# Patient Record
Sex: Male | Born: 1960 | Race: White | Hispanic: No | Marital: Single | Smoking: Current every day smoker
Health system: Southern US, Community
[De-identification: ages and names within clinical notes are randomized; demographics above are authoritative.]

## PROBLEM LIST (undated history)

## (undated) DIAGNOSIS — N289 Disorder of kidney and ureter, unspecified: Secondary | ICD-10-CM

## (undated) DIAGNOSIS — I10 Essential (primary) hypertension: Secondary | ICD-10-CM

---

## 1997-05-14 ENCOUNTER — Emergency Department: Admission: EM | Admit: 1997-05-14 | Discharge: 1997-05-14 | Payer: Self-pay | Admitting: Emergency Medicine

## 1997-08-12 ENCOUNTER — Emergency Department (HOSPITAL_COMMUNITY): Admission: EM | Admit: 1997-08-12 | Discharge: 1997-08-12 | Payer: Self-pay | Admitting: Emergency Medicine

## 2004-11-30 ENCOUNTER — Other Ambulatory Visit: Payer: Self-pay

## 2004-11-30 ENCOUNTER — Emergency Department: Payer: Self-pay | Admitting: Emergency Medicine

## 2004-11-30 IMAGING — CT CT HEAD WITHOUT CONTRAST
2 series · 16 of 30 positions shown, 20 images · non-contrast
Comparison: none

REASON FOR EXAM: syncopal episode, ? seizure. Rm 17
COMMENTS:

[Series 2: without · axial · non-contrast · 0.39mm/px · z∈[-142,-22]mm · 13 of 29 slices shown, 17 images]
[im 3/29  brain]
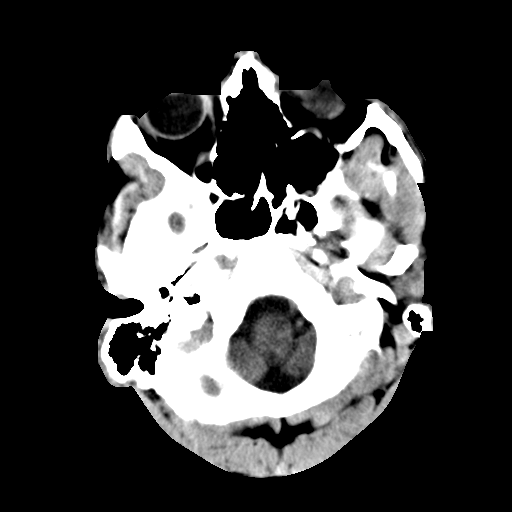
[im 3/29  bone]
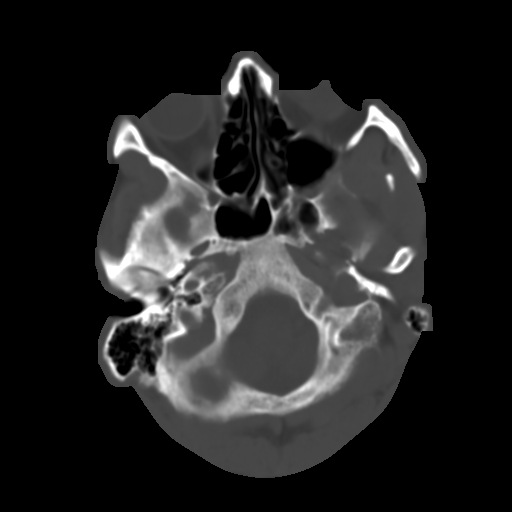
[im 5/29  brain]
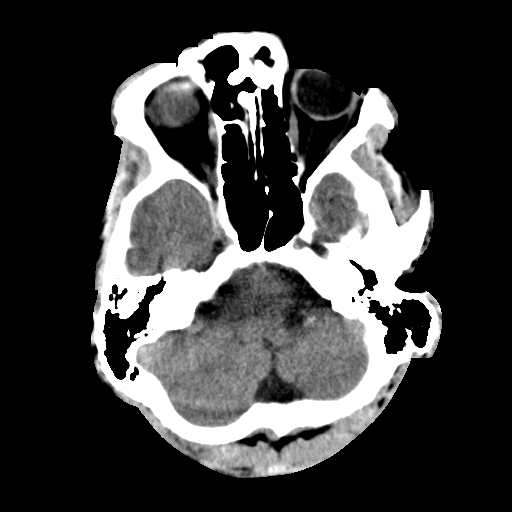
[im 7/29  brain]
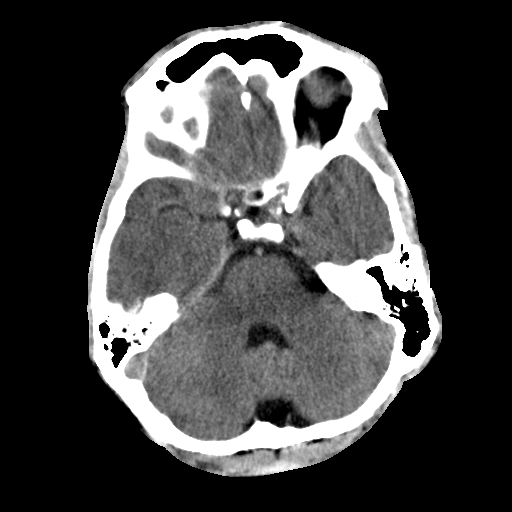
[im 9/29  brain]
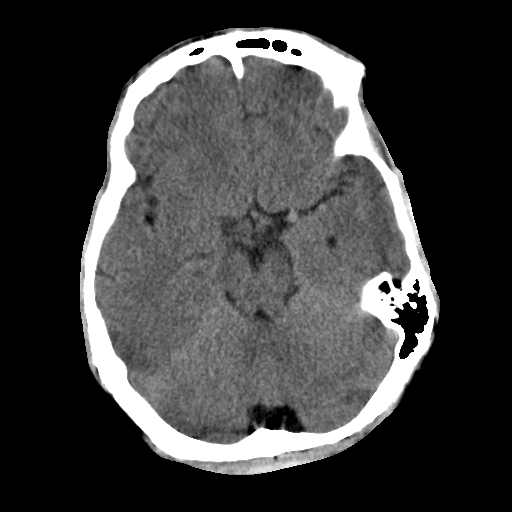
[im 11/29  brain]
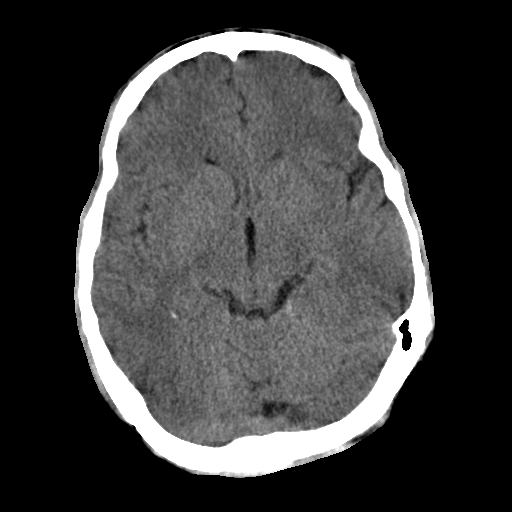
[im 11/29  bone]
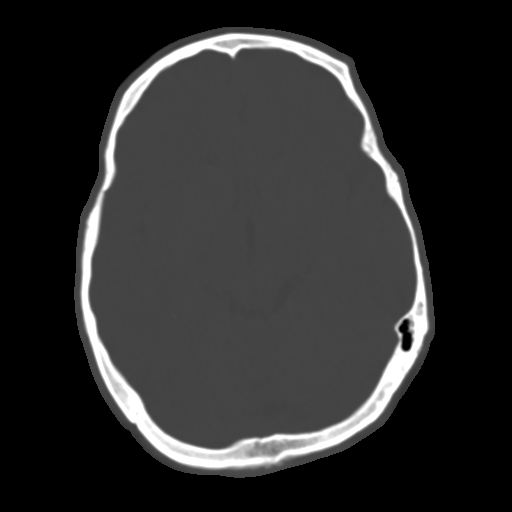
[im 13/29  brain]
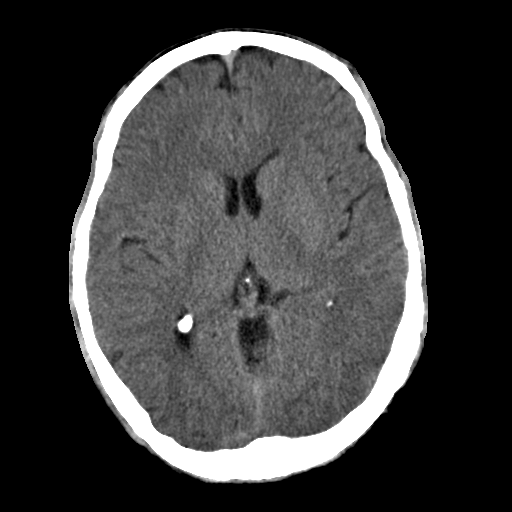
[im 15/29  brain]
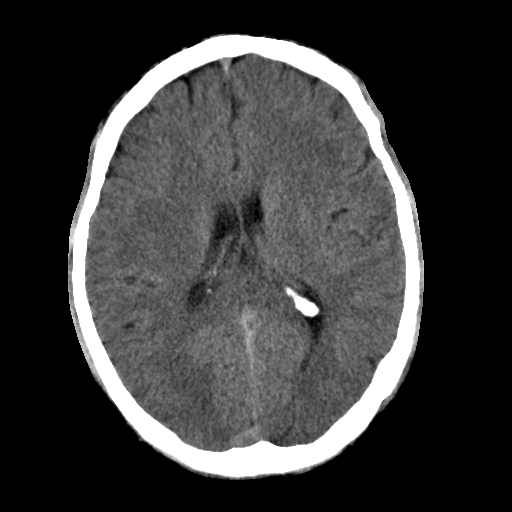
[im 17/29  brain]
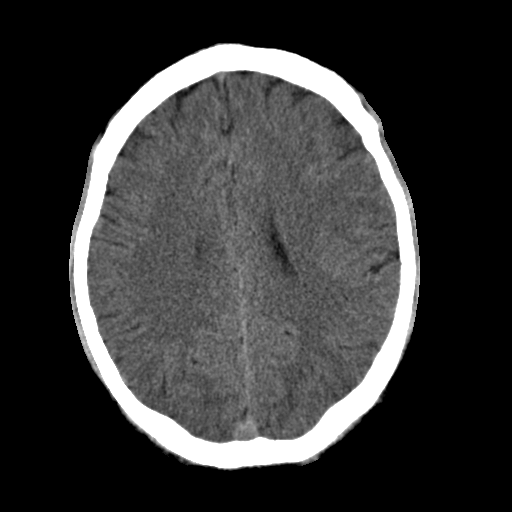
[im 19/29  brain]
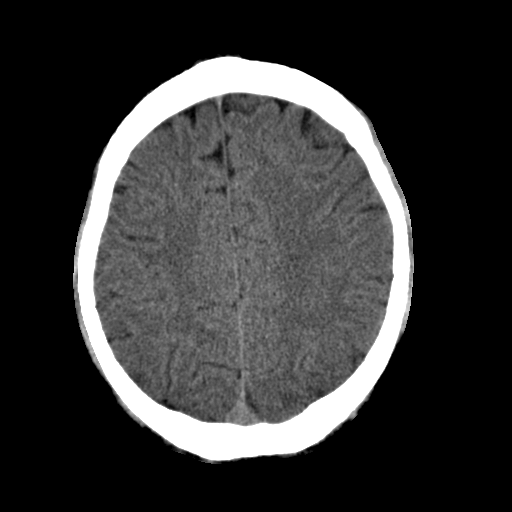
[im 19/29  bone]
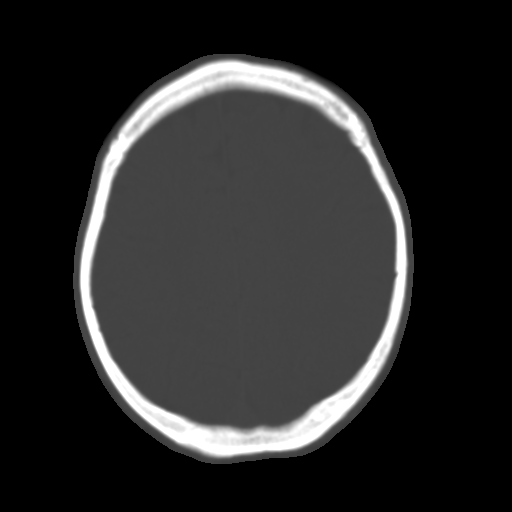
[im 21/29  brain]
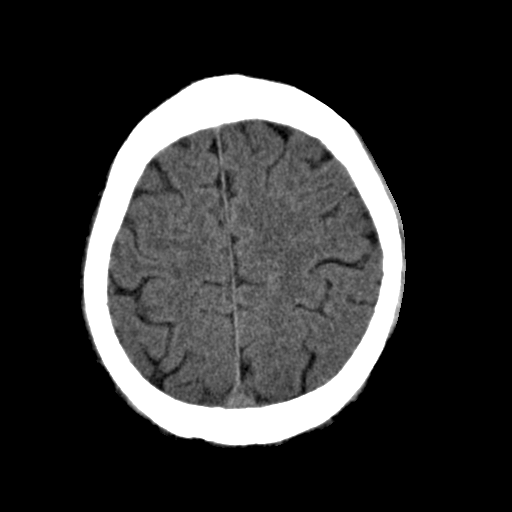
[im 23/29  brain]
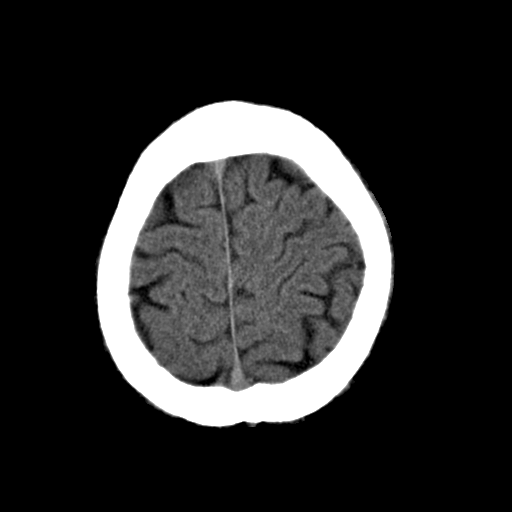
[im 25/29  brain]
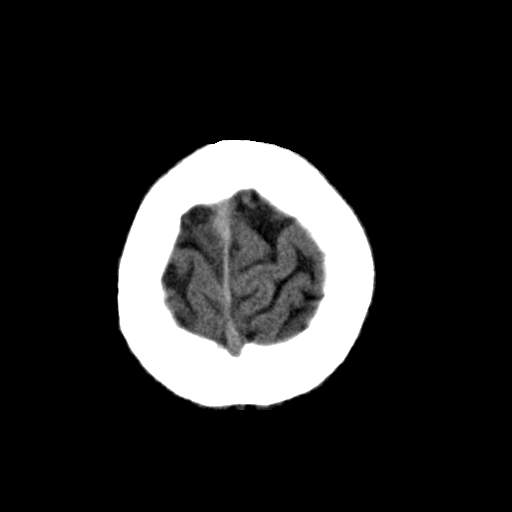
[im 27/29  brain]
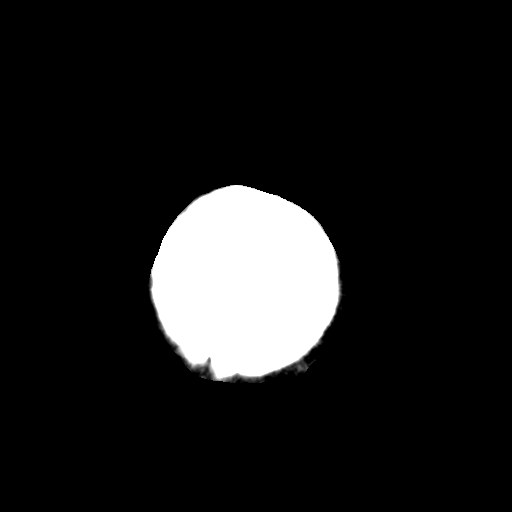
[im 27/29  bone]
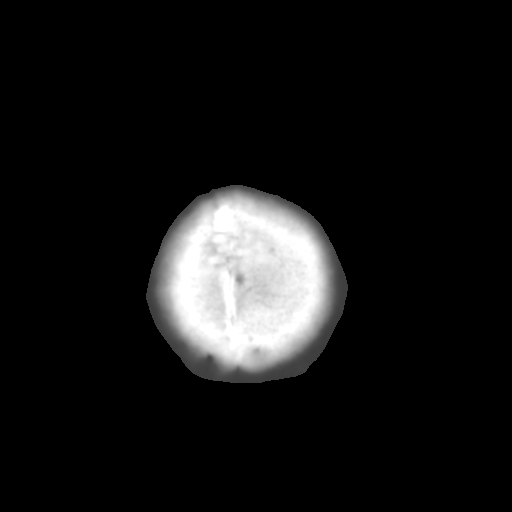

[Series 3: bone · axial · 0.39mm/px · z∈[-142,-102]mm · 3 of 29 slices shown]
[im 3/29  bone]
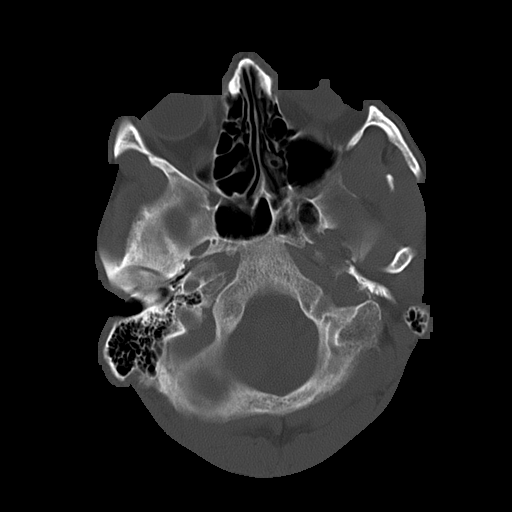
[im 7/29  bone]
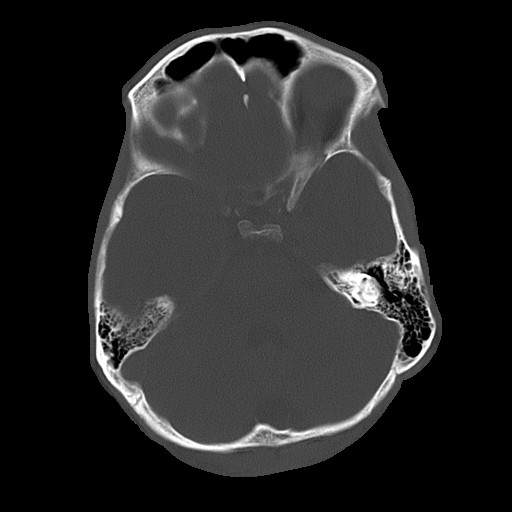
[im 11/29  bone]
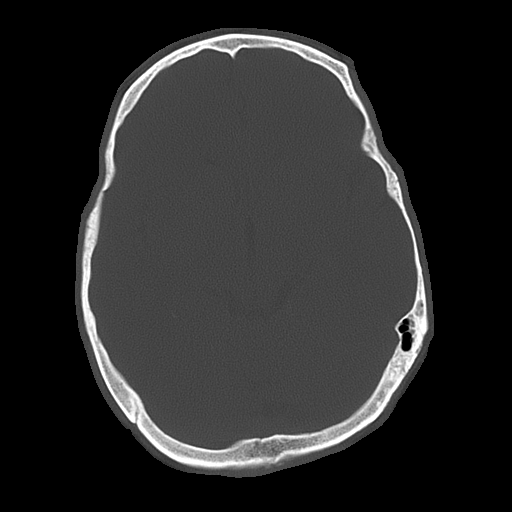

[16 of 30 positions shown; findings below may reference images not displayed]

PROCEDURE:     CT  - CT HEAD WITHOUT CONTRAST  - [DATE] [DATE]

RESULT:     Unenhanced head CT was performed.  No intracerebral bleeds.  No
infarcts.  No mass effect.  No shift of the midline.  The ventricles appear
within normal limits in size.  No extraaxial fluid collections are
identified.

On the bone windows, the globe appears intact.
IMPRESSION: No acute abnormalities on the unenhanced emergent head CT.  This report was
called to the emergency room at the conclusion of the study.

## 2004-11-30 IMAGING — CR DG CHEST 1V PORT
1 series · 1 of 1 positions shown · non-contrast
Comparison: none

REASON FOR EXAM: Chest pain
COMMENTS:

[view not recorded]
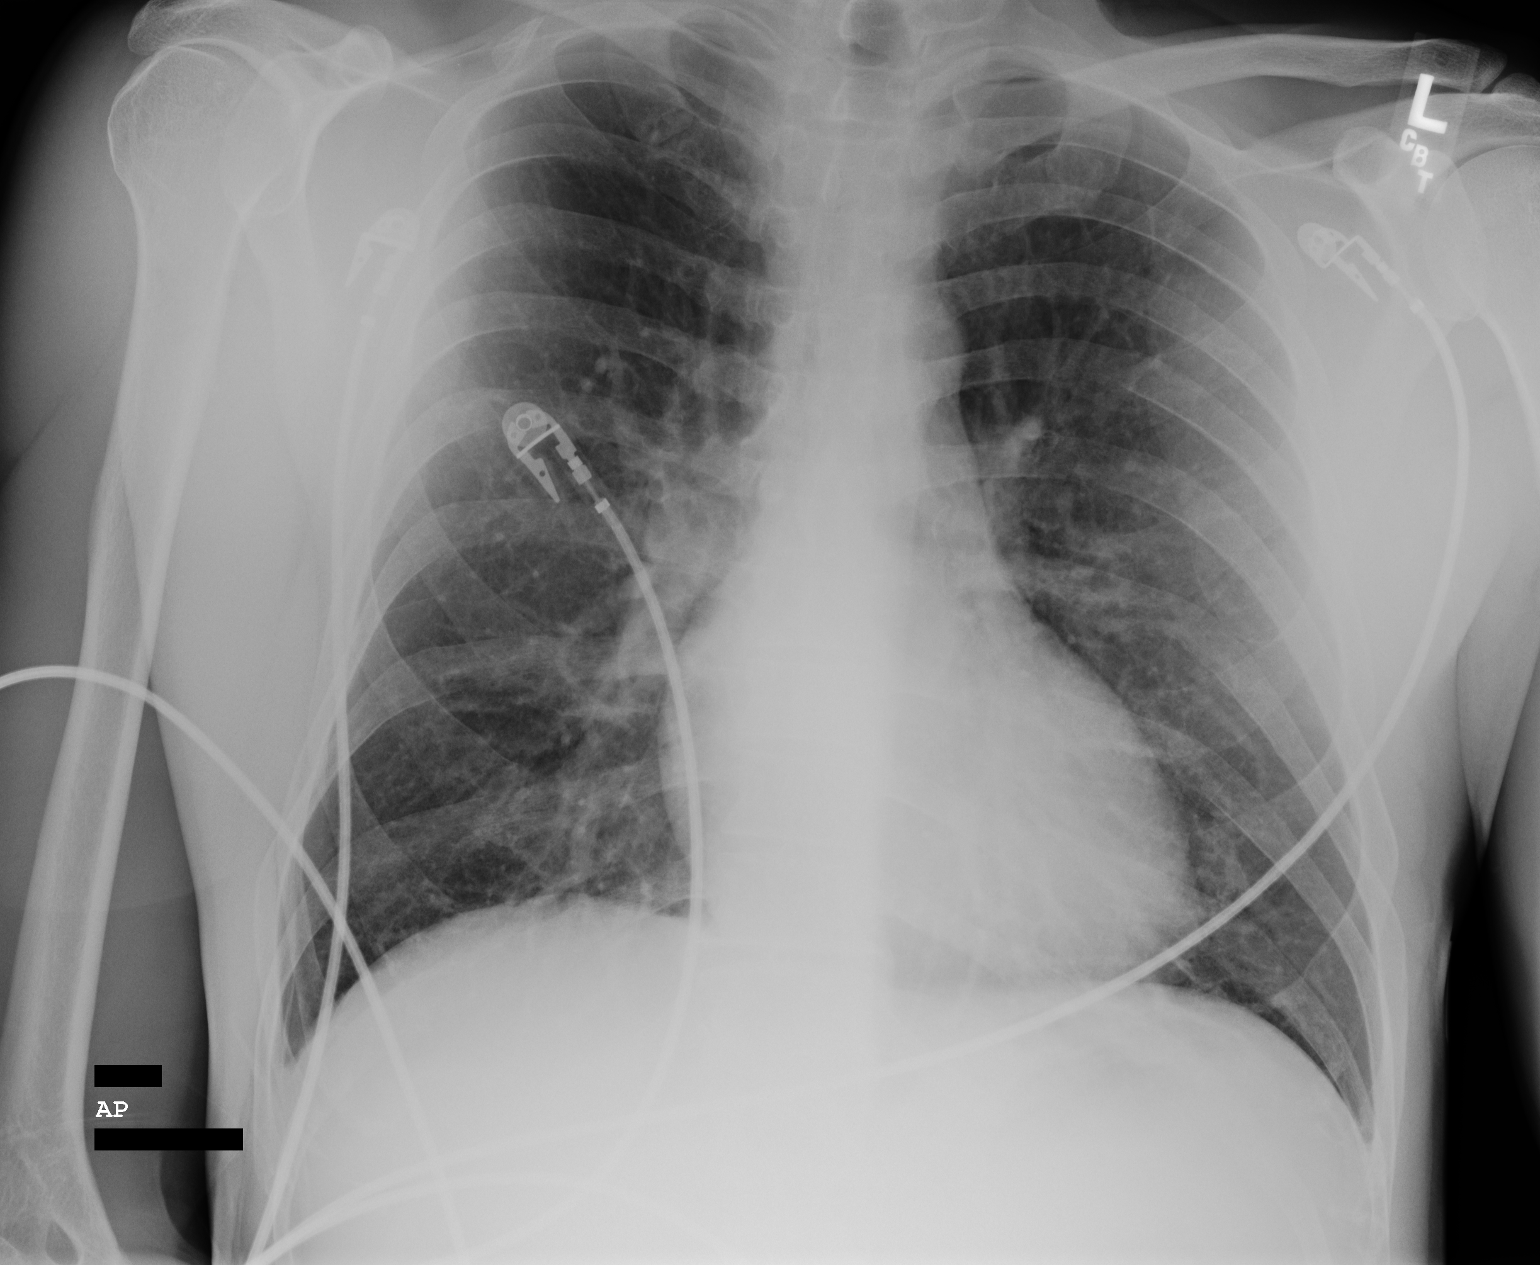

[1 of 1 positions shown; findings below may reference images not displayed]

PROCEDURE:     DXR - DXR PORTABLE CHEST SINGLE VIEW  - [DATE] [DATE]

RESULT:     There are no prior studies for comparison.

The lungs are adequately inflated.  There is no focal infiltrate.  The heart
is not enlarged.  The pulmonary vascularity is very mildly prominent
centrally but I do not see cephalization of flow and no pleural effusion.
IMPRESSION: I do not see objective evidence of congestive heart
failure nor of pneumonia. I cannot exclude subsegmental atelectasis in the
perihilar regions.

## 2007-08-07 ENCOUNTER — Emergency Department (HOSPITAL_COMMUNITY): Admission: EM | Admit: 2007-08-07 | Discharge: 2007-08-07 | Payer: Self-pay | Admitting: Emergency Medicine

## 2007-08-08 ENCOUNTER — Emergency Department (HOSPITAL_COMMUNITY): Admission: EM | Admit: 2007-08-08 | Discharge: 2007-08-08 | Payer: Self-pay | Admitting: Emergency Medicine

## 2007-09-14 ENCOUNTER — Emergency Department (HOSPITAL_COMMUNITY): Admission: EM | Admit: 2007-09-14 | Discharge: 2007-09-14 | Payer: Self-pay | Admitting: Emergency Medicine

## 2007-09-16 ENCOUNTER — Emergency Department (HOSPITAL_COMMUNITY): Admission: EM | Admit: 2007-09-16 | Discharge: 2007-09-16 | Payer: Self-pay | Admitting: Family Medicine

## 2010-09-30 LAB — POCT I-STAT, CHEM 8
BUN: 16
Chloride: 103
Creatinine, Ser: 1.4
Sodium: 136
TCO2: 26

## 2010-09-30 LAB — CBC
HCT: 49
Hemoglobin: 16.7
Platelets: 194
WBC: 12.8 — ABNORMAL HIGH

## 2010-09-30 LAB — DIFFERENTIAL
Eosinophils Relative: 1
Lymphocytes Relative: 14
Lymphs Abs: 1.7
Neutro Abs: 10 — ABNORMAL HIGH

## 2019-03-10 ENCOUNTER — Observation Stay (HOSPITAL_COMMUNITY): Payer: Medicaid Other

## 2019-03-10 ENCOUNTER — Inpatient Hospital Stay (HOSPITAL_COMMUNITY)
Admission: EM | Admit: 2019-03-10 | Discharge: 2019-04-04 | DRG: 674 | Disposition: A | Discharge status: Home / Self Care | Payer: Medicaid Other | Attending: Internal Medicine | Admitting: Internal Medicine

## 2019-03-10 ENCOUNTER — Emergency Department (HOSPITAL_COMMUNITY): Payer: Medicaid Other

## 2019-03-10 ENCOUNTER — Other Ambulatory Visit: Payer: Self-pay

## 2019-03-10 ENCOUNTER — Encounter (HOSPITAL_COMMUNITY): Payer: Self-pay | Admitting: Emergency Medicine

## 2019-03-10 DIAGNOSIS — N179 Acute kidney failure, unspecified: Secondary | ICD-10-CM | POA: Diagnosis present

## 2019-03-10 DIAGNOSIS — E872 Acidosis: Secondary | ICD-10-CM | POA: Diagnosis present

## 2019-03-10 DIAGNOSIS — R001 Bradycardia, unspecified: Secondary | ICD-10-CM | POA: Diagnosis present

## 2019-03-10 DIAGNOSIS — E8809 Other disorders of plasma-protein metabolism, not elsewhere classified: Secondary | ICD-10-CM | POA: Diagnosis present

## 2019-03-10 DIAGNOSIS — D631 Anemia in chronic kidney disease: Secondary | ICD-10-CM | POA: Diagnosis present

## 2019-03-10 DIAGNOSIS — Z20822 Contact with and (suspected) exposure to covid-19: Secondary | ICD-10-CM | POA: Diagnosis present

## 2019-03-10 DIAGNOSIS — H532 Diplopia: Secondary | ICD-10-CM | POA: Diagnosis present

## 2019-03-10 DIAGNOSIS — E860 Dehydration: Secondary | ICD-10-CM

## 2019-03-10 DIAGNOSIS — J8410 Pulmonary fibrosis, unspecified: Secondary | ICD-10-CM | POA: Diagnosis present

## 2019-03-10 DIAGNOSIS — Z590 Homelessness: Secondary | ICD-10-CM

## 2019-03-10 DIAGNOSIS — I776 Arteritis, unspecified: Secondary | ICD-10-CM | POA: Diagnosis present

## 2019-03-10 DIAGNOSIS — Z833 Family history of diabetes mellitus: Secondary | ICD-10-CM

## 2019-03-10 DIAGNOSIS — I7789 Other specified disorders of arteries and arterioles: Secondary | ICD-10-CM | POA: Diagnosis present

## 2019-03-10 DIAGNOSIS — Z720 Tobacco use: Secondary | ICD-10-CM | POA: Diagnosis present

## 2019-03-10 DIAGNOSIS — D72829 Elevated white blood cell count, unspecified: Secondary | ICD-10-CM | POA: Diagnosis not present

## 2019-03-10 DIAGNOSIS — Z8249 Family history of ischemic heart disease and other diseases of the circulatory system: Secondary | ICD-10-CM

## 2019-03-10 DIAGNOSIS — F17210 Nicotine dependence, cigarettes, uncomplicated: Secondary | ICD-10-CM | POA: Diagnosis present

## 2019-03-10 DIAGNOSIS — R42 Dizziness and giddiness: Secondary | ICD-10-CM

## 2019-03-10 DIAGNOSIS — M317 Microscopic polyangiitis: Secondary | ICD-10-CM | POA: Diagnosis present

## 2019-03-10 DIAGNOSIS — N2581 Secondary hyperparathyroidism of renal origin: Secondary | ICD-10-CM | POA: Diagnosis present

## 2019-03-10 DIAGNOSIS — N19 Unspecified kidney failure: Secondary | ICD-10-CM

## 2019-03-10 DIAGNOSIS — J449 Chronic obstructive pulmonary disease, unspecified: Secondary | ICD-10-CM | POA: Diagnosis present

## 2019-03-10 DIAGNOSIS — N189 Chronic kidney disease, unspecified: Secondary | ICD-10-CM | POA: Diagnosis present

## 2019-03-10 DIAGNOSIS — I129 Hypertensive chronic kidney disease with stage 1 through stage 4 chronic kidney disease, or unspecified chronic kidney disease: Secondary | ICD-10-CM | POA: Diagnosis present

## 2019-03-10 DIAGNOSIS — D649 Anemia, unspecified: Secondary | ICD-10-CM | POA: Diagnosis present

## 2019-03-10 DIAGNOSIS — E875 Hyperkalemia: Secondary | ICD-10-CM | POA: Diagnosis present

## 2019-03-10 LAB — HEPATITIS PANEL, ACUTE
HCV Ab: NONREACTIVE
Hep A IgM: NONREACTIVE
Hep B C IgM: NONREACTIVE
Hepatitis B Surface Ag: NONREACTIVE

## 2019-03-10 LAB — COMPREHENSIVE METABOLIC PANEL
ALT: 16 U/L (ref 0–44)
AST: 10 U/L — ABNORMAL LOW (ref 15–41)
Albumin: 3.4 g/dL — ABNORMAL LOW (ref 3.5–5.0)
Alkaline Phosphatase: 78 U/L (ref 38–126)
Anion gap: 12 (ref 5–15)
BUN: 88 mg/dL — ABNORMAL HIGH (ref 6–20)
CO2: 17 mmol/L — ABNORMAL LOW (ref 22–32)
Calcium: 9.2 mg/dL (ref 8.9–10.3)
Chloride: 109 mmol/L (ref 98–111)
Creatinine, Ser: 10.96 mg/dL — ABNORMAL HIGH (ref 0.61–1.24)
GFR calc Af Amer: 5 mL/min — ABNORMAL LOW (ref >60)
GFR calc non Af Amer: 5 mL/min — ABNORMAL LOW (ref >60)
Glucose, Bld: 108 mg/dL — ABNORMAL HIGH (ref 70–99)
Potassium: 5.2 mmol/L — ABNORMAL HIGH (ref 3.5–5.1)
Sodium: 138 mmol/L (ref 135–145)
Total Bilirubin: 0.6 mg/dL (ref 0.3–1.2)
Total Protein: 7.5 g/dL (ref 6.5–8.1)

## 2019-03-10 LAB — GLUCOSE, CAPILLARY
Glucose-Capillary: 152 mg/dL — ABNORMAL HIGH (ref 70–99)
Glucose-Capillary: 197 mg/dL — ABNORMAL HIGH (ref 70–99)

## 2019-03-10 LAB — BASIC METABOLIC PANEL
Anion gap: 7 (ref 5–15)
Anion gap: 9 (ref 5–15)
BUN: 85 mg/dL — ABNORMAL HIGH (ref 6–20)
BUN: 85 mg/dL — ABNORMAL HIGH (ref 6–20)
CO2: 19 mmol/L — ABNORMAL LOW (ref 22–32)
CO2: 16 mmol/L — ABNORMAL LOW (ref 22–32)
Calcium: 8.2 mg/dL — ABNORMAL LOW (ref 8.9–10.3)
Calcium: 7.8 mg/dL — ABNORMAL LOW (ref 8.9–10.3)
Chloride: 112 mmol/L — ABNORMAL HIGH (ref 98–111)
Chloride: 112 mmol/L — ABNORMAL HIGH (ref 98–111)
Creatinine, Ser: 10.58 mg/dL — ABNORMAL HIGH (ref 0.61–1.24)
Creatinine, Ser: 10.6 mg/dL — ABNORMAL HIGH (ref 0.61–1.24)
GFR calc Af Amer: 6 mL/min — ABNORMAL LOW (ref >60)
GFR calc Af Amer: 6 mL/min — ABNORMAL LOW (ref >60)
GFR calc non Af Amer: 5 mL/min — ABNORMAL LOW (ref >60)
GFR calc non Af Amer: 5 mL/min — ABNORMAL LOW (ref >60)
Glucose, Bld: 143 mg/dL — ABNORMAL HIGH (ref 70–99)
Glucose, Bld: 246 mg/dL — ABNORMAL HIGH (ref 70–99)
Potassium: 6.2 mmol/L — ABNORMAL HIGH (ref 3.5–5.1)
Potassium: 6.1 mmol/L — ABNORMAL HIGH (ref 3.5–5.1)
Sodium: 138 mmol/L (ref 135–145)
Sodium: 137 mmol/L (ref 135–145)

## 2019-03-10 LAB — URINALYSIS, ROUTINE W REFLEX MICROSCOPIC
Bilirubin Urine: NEGATIVE
Glucose, UA: NEGATIVE mg/dL
Ketones, ur: NEGATIVE mg/dL
Leukocytes,Ua: NEGATIVE
Nitrite: NEGATIVE
Protein, ur: 100 mg/dL — AB
RBC / HPF: >50 RBC/hpf — ABNORMAL HIGH (ref 0–5)
Specific Gravity, Urine: 1.01 (ref 1.005–1.030)
pH: 5 (ref 5.0–8.0)

## 2019-03-10 LAB — URIC ACID: Uric Acid, Serum: 5.9 mg/dL (ref 3.7–8.6)

## 2019-03-10 LAB — CBC
HCT: 36 % — ABNORMAL LOW (ref 39.0–52.0)
Hemoglobin: 11.6 g/dL — ABNORMAL LOW (ref 13.0–17.0)
MCH: 28.9 pg (ref 26.0–34.0)
MCHC: 32.2 g/dL (ref 30.0–36.0)
MCV: 89.8 fL (ref 80.0–100.0)
Platelets: 244 10*3/uL (ref 150–400)
RBC: 4.01 MIL/uL — ABNORMAL LOW (ref 4.22–5.81)
RDW: 12.6 % (ref 11.5–15.5)
WBC: 9.6 10*3/uL (ref 4.0–10.5)
nRBC: 0 % (ref 0.0–0.2)

## 2019-03-10 LAB — SODIUM, URINE, RANDOM: Sodium, Ur: 39 mmol/L

## 2019-03-10 LAB — CK: Total CK: 45 U/L — ABNORMAL LOW (ref 49–397)

## 2019-03-10 LAB — HIV ANTIBODY (ROUTINE TESTING W REFLEX): HIV Screen 4th Generation wRfx: NONREACTIVE

## 2019-03-10 LAB — CREATININE, URINE, RANDOM: Creatinine, Urine: 91.02 mg/dL

## 2019-03-10 LAB — SARS CORONAVIRUS 2 (TAT 6-24 HRS): SARS Coronavirus 2: NEGATIVE

## 2019-03-10 LAB — TSH: TSH: 3.147 u[IU]/mL (ref 0.350–4.500)

## 2019-03-10 LAB — CBG MONITORING, ED: Glucose-Capillary: 100 mg/dL — ABNORMAL HIGH (ref 70–99)

## 2019-03-10 IMAGING — DX DG ANKLE COMPLETE 3+V*R*
3 series · 3 of 3 positions shown · non-contrast
Comparison: None.

CLINICAL DATA: Progressive pain

EXAM:
RIGHT ANKLE - COMPLETE 3+ VIEW

[ankle ap]
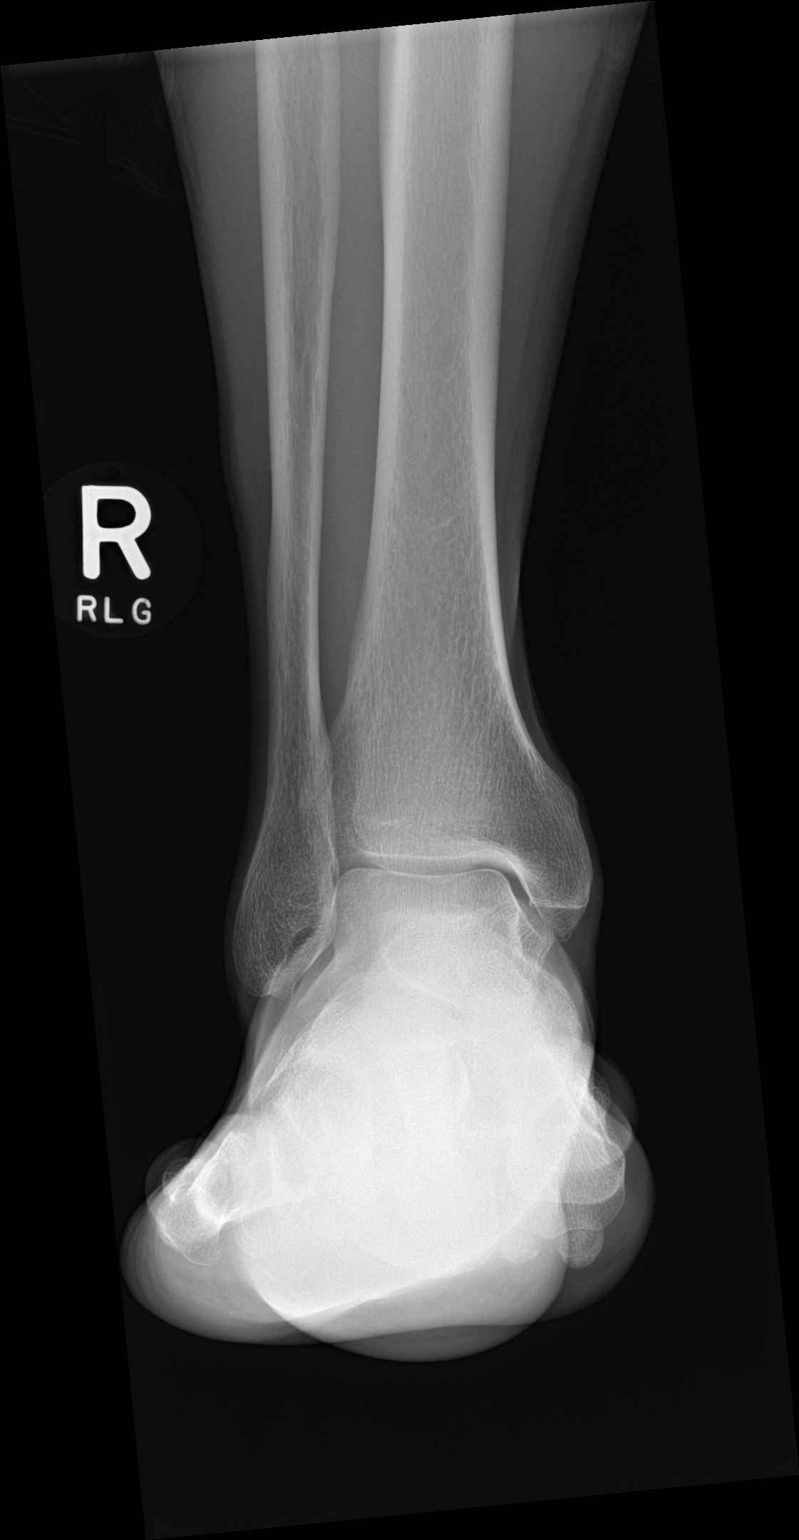

[ankle obl]
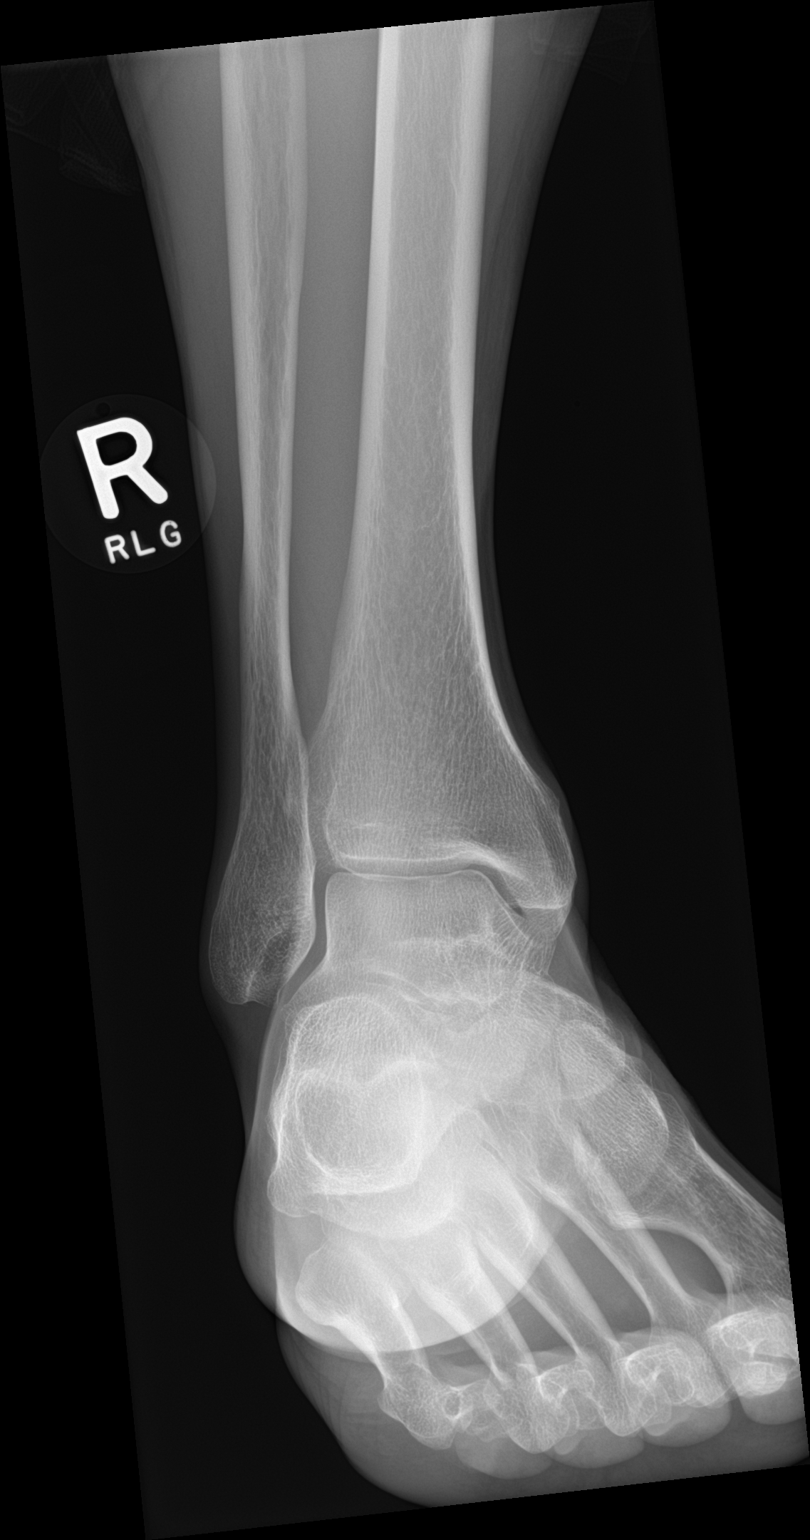

[ankle lat]
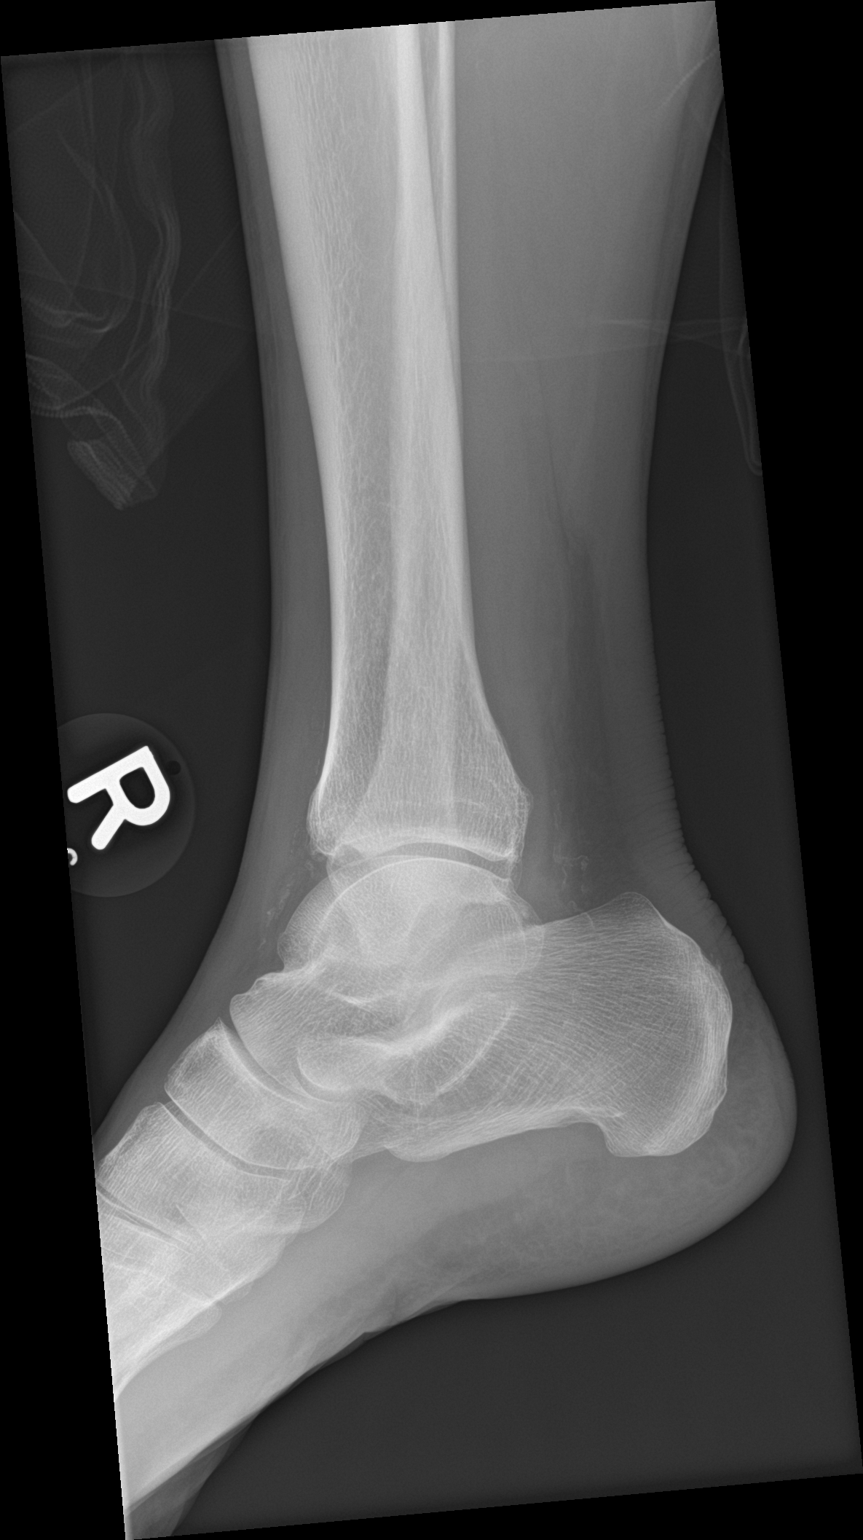

[3 of 3 positions shown; findings below may reference images not displayed]

FINDINGS: Frontal, oblique, and lateral views were obtained. No evident
fracture or joint effusion. No appreciable joint space narrowing or
erosion. Ankle mortise appears intact. There are multiple foci of
arterial vascular calcification.
IMPRESSION: No evident fracture or appreciable arthropathy. Ankle mortise
appears intact. There are foci of arterial vascular calcification

## 2019-03-10 IMAGING — US US RENAL
1 series · 14 of 25 positions shown · non-contrast
Comparison: None.

CLINICAL DATA: New onset renal failure.

EXAM:
RENAL / URINARY TRACT ULTRASOUND COMPLETE

[Series 1: us renal · 14 of 46 slices shown]
[im 1/46]
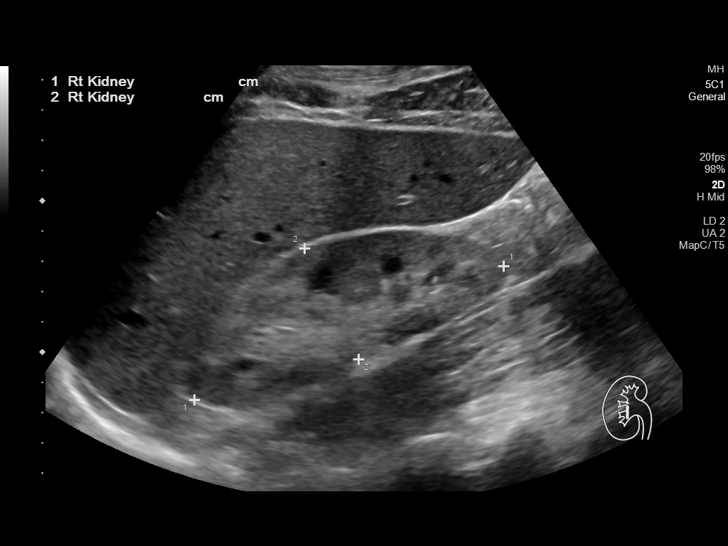
[im 4/46]
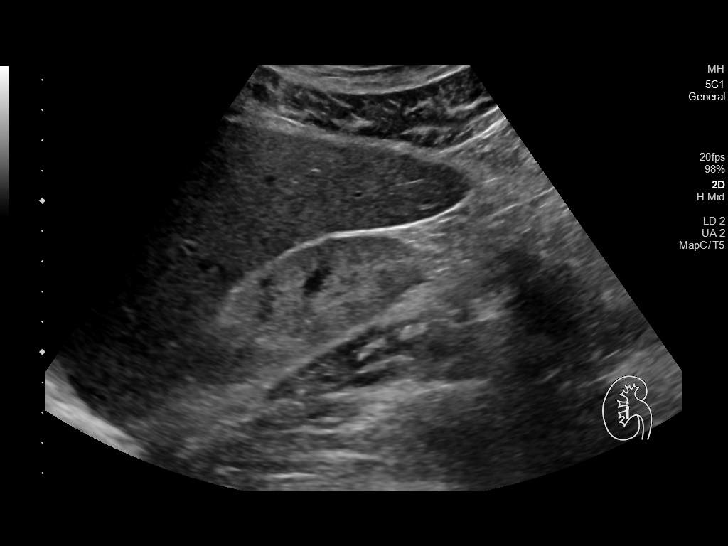
[im 8/46]
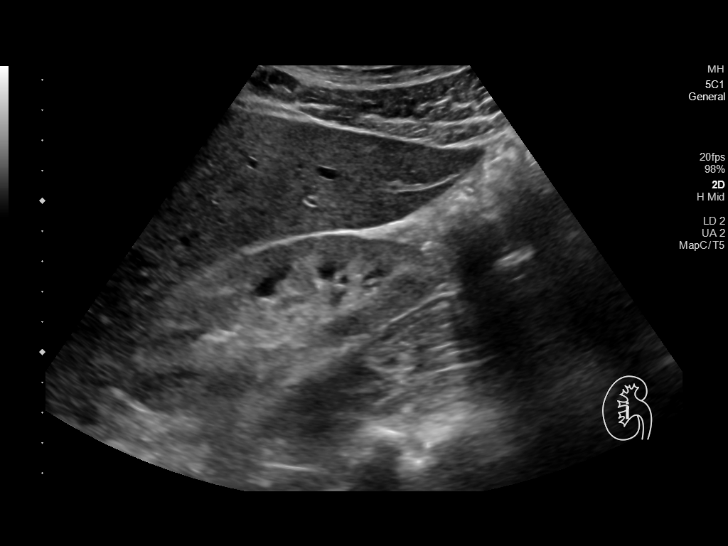
[im 12/46]
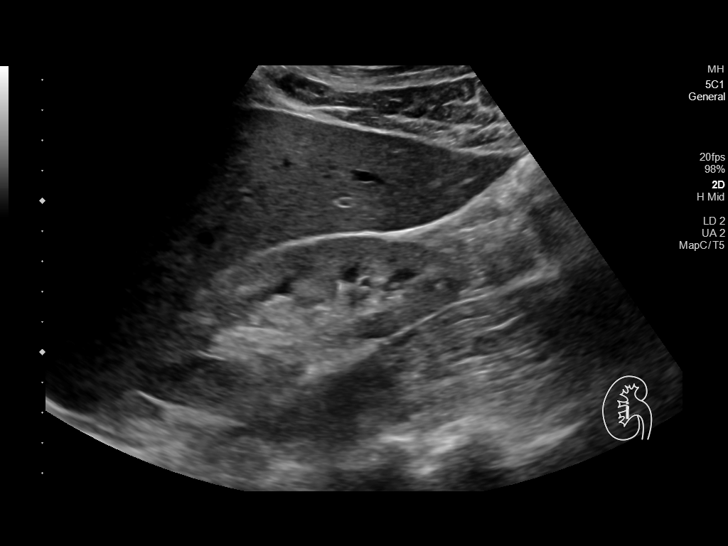
[im 16/46]
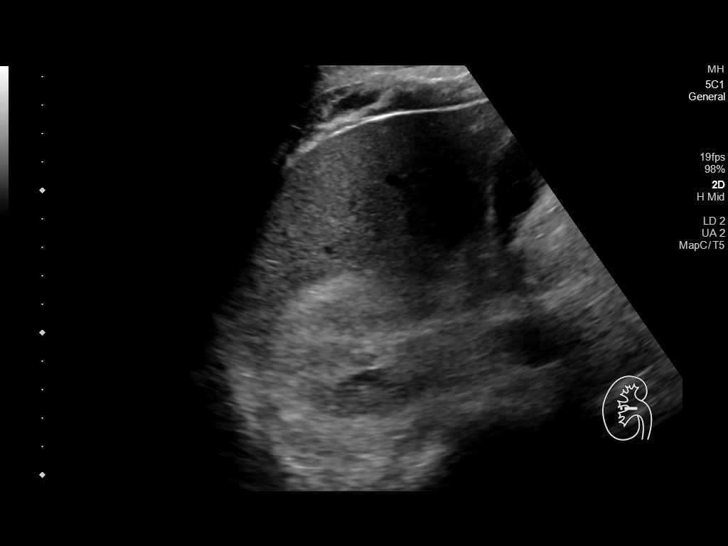
[im 17/46]
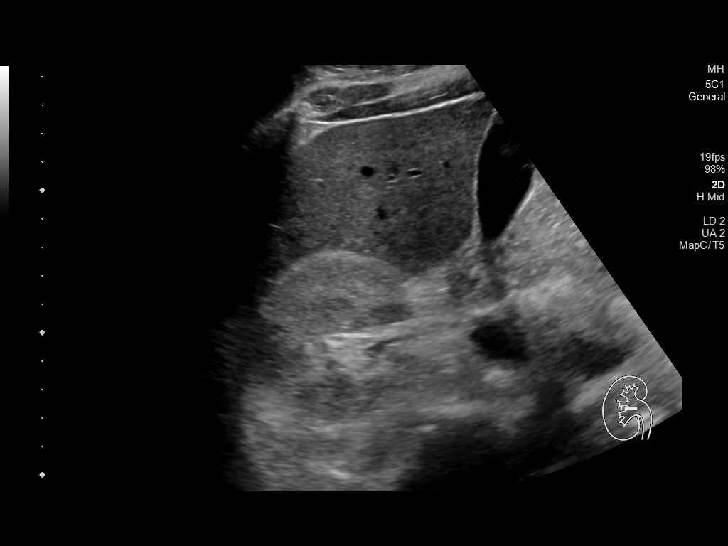
[im 21/46]
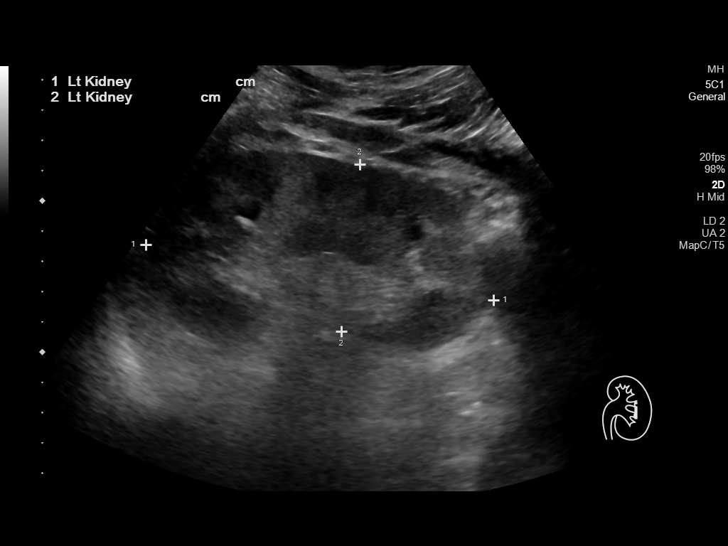
[im 25/46]
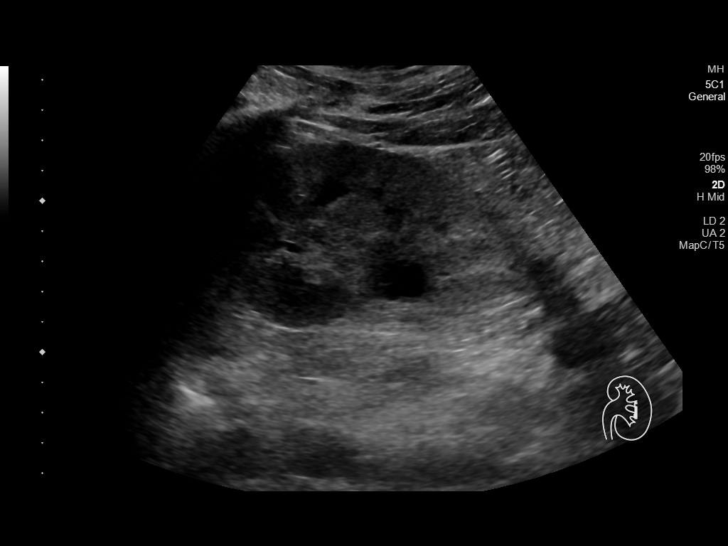
[im 29/46]
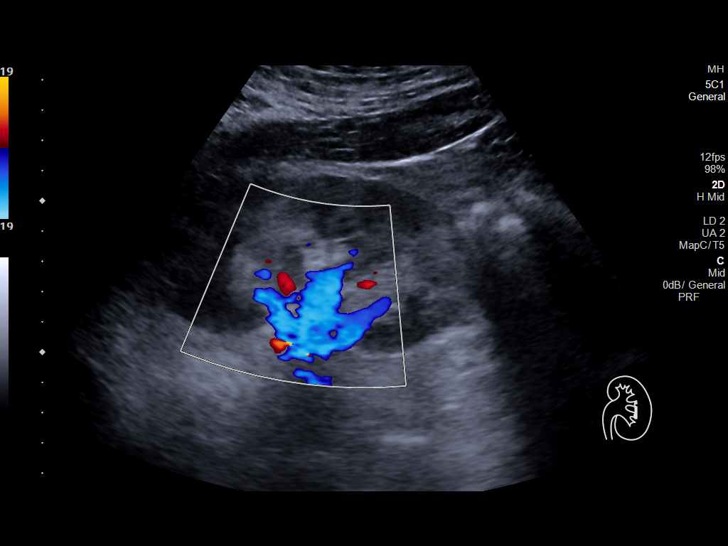
[im 31/46]
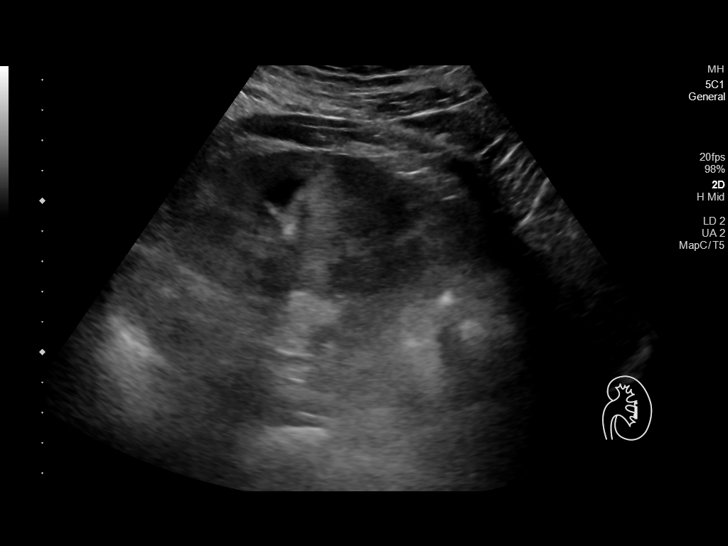
[im 34/46]
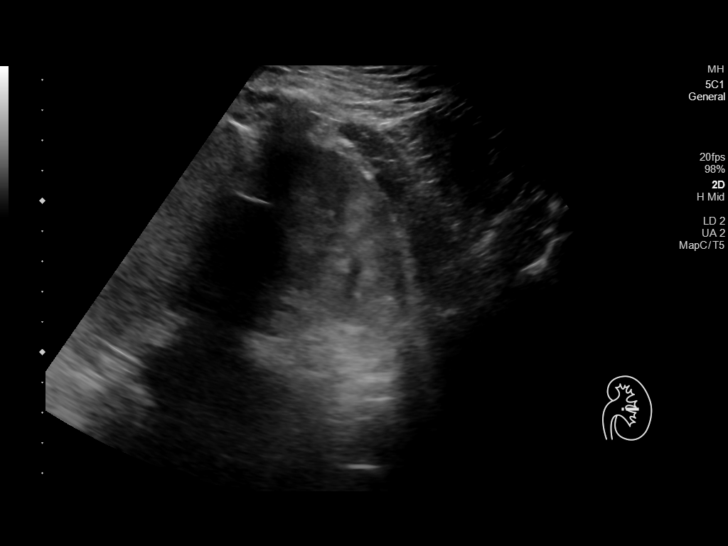
[im 38/46]
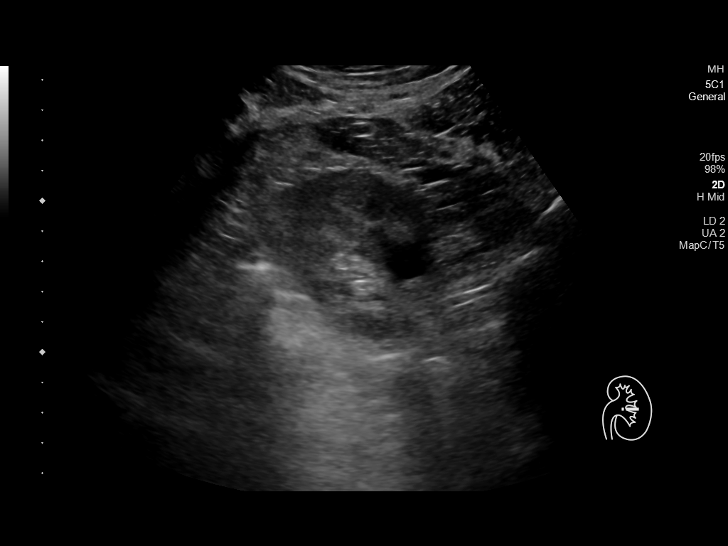
[im 42/46]
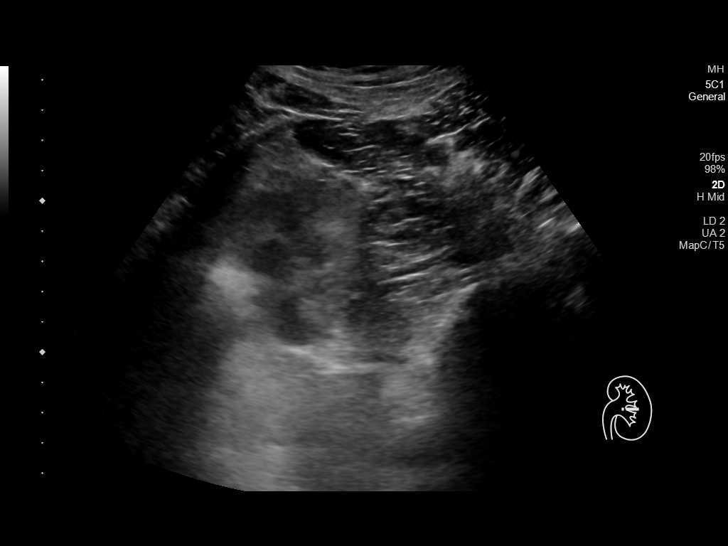
[im 46/46]
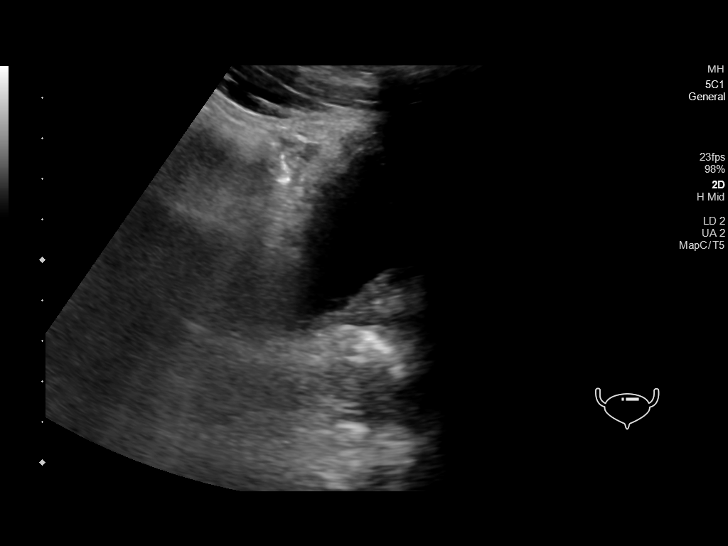

[14 of 25 positions shown; findings below may reference images not displayed]

FINDINGS: Right Kidney:

Renal measurements: 11.1 x 4.1 x 5.0 cm = volume: 120 mL. Increased
parenchymal echogenicity. No mass, stone or hydronephrosis.

Left Kidney:

Renal measurements: 11.6 x 5.6 x 5.1 cm = volume: 172 mL. Increased
parenchymal echogenicity. Medial midpole cyst measuring 1.7 x 1.3 x
1.4 cm. No other masses, no stones and no hydronephrosis.

Bladder:

Minimally distended.  Otherwise unremarkable.

Other:

None.
IMPRESSION: 1. Bilateral increased renal parenchymal echogenicity consistent
with medical renal disease.
2. No hydronephrosis.  No acute finding.
3. 1.7 cm left renal cyst.

## 2019-03-10 IMAGING — MR MR HEAD W/O CM
11 series · 48 of 48 positions shown · non-contrast
Comparison: CT head [DATE]

CLINICAL DATA: Subacute neuro deficit.  Diarrhea and vomiting

EXAM:
MRI HEAD WITHOUT CONTRAST
TECHNIQUE: Multiplanar, multiecho pulse sequences of the brain and surrounding
structures were obtained without intravenous contrast.

[Series 5: T1 · sagittal · 5.0mm · 0.75mm/px · 1 of 24 slices shown (1 of 2)]
[im 1/24]
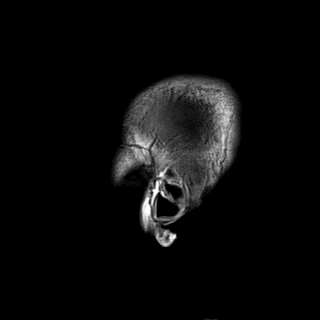

[Series 6: T2 · axial · 5.0mm · 0.62mm/px · z∈[-41,+119]mm · 2 of 26 slices shown (1 of 2)]
[im 1/26]
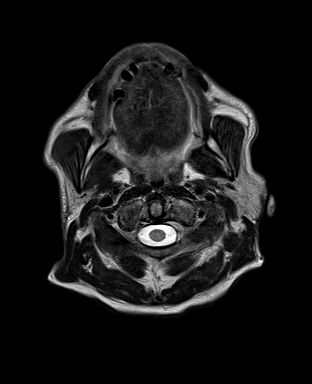
[im 26/26]
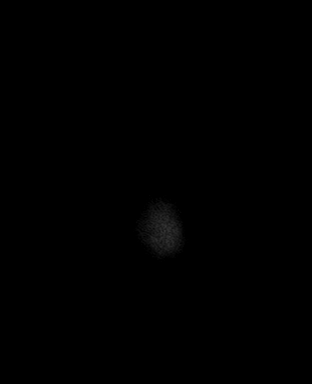

[Series 7: DWI · axial · 3.0mm · 1.36mm/px · z∈[-27,+128]mm · 8 of 108 slices shown (1 of 4)]
[im 1/108]
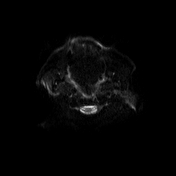
[im 16/108]
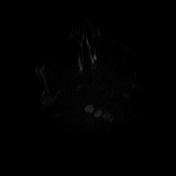
[im 31/108]
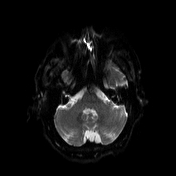
[im 46/108]
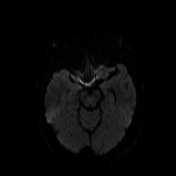
[im 62/108]
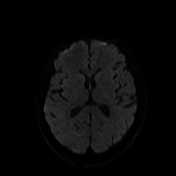
[im 77/108]
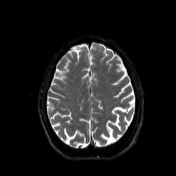
[im 92/108]
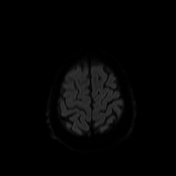
[im 108/108]
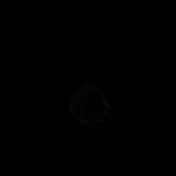

[Series 8: DWI · axial · 3.0mm · 1.36mm/px · z∈[-27,+128]mm · 4 of 53 slices shown (2 of 4)]
[im 1/53]
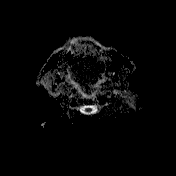
[im 18/53]
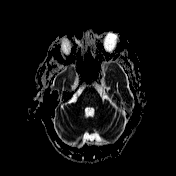
[im 35/53]
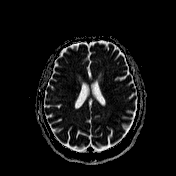
[im 53/53]
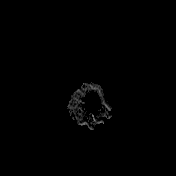

[Series 9: mip_images(sw) · axial · 24.0mm · 0.75mm/px · z∈[-27,+103]mm · 3 of 45 slices shown]
[im 1/45]
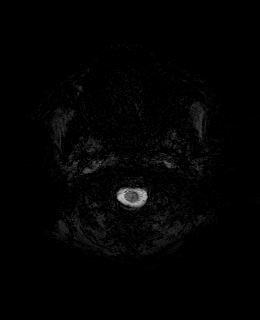
[im 23/45]
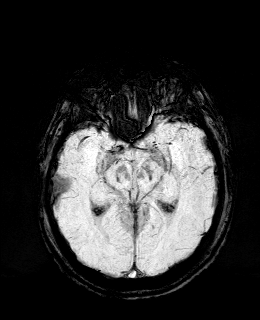
[im 45/45]
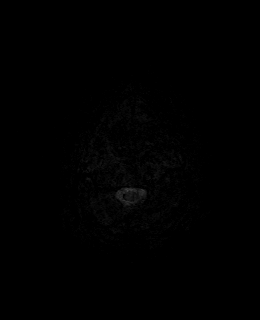

[Series 10: swi_images · axial · 3.0mm · 0.75mm/px · z∈[-37,+114]mm · 4 of 52 slices shown]
[im 1/52]
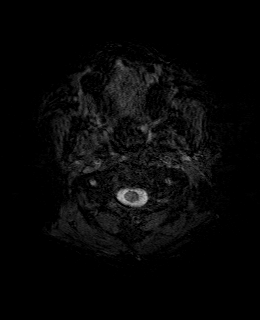
[im 18/52]
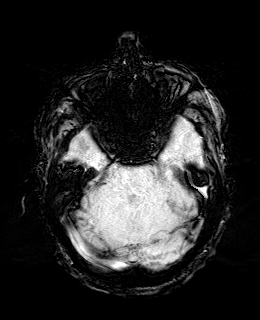
[im 35/52]
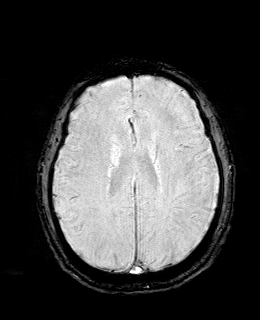
[im 52/52]
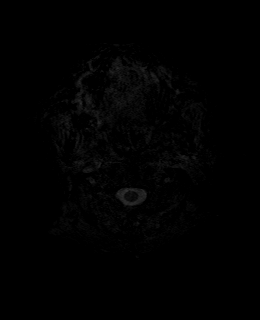

[Series 11: FLAIR · axial · 3.0mm · 0.75mm/px · z∈[-35,+121]mm · 4 of 54 slices shown]
[im 1/54]
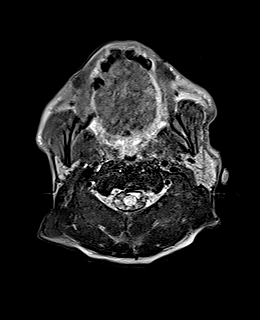
[im 18/54]
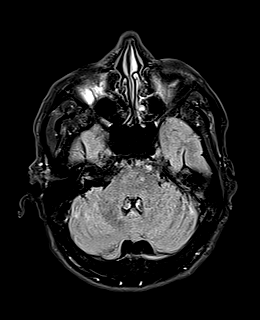
[im 36/54]
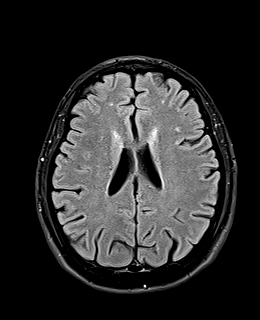
[im 54/54]
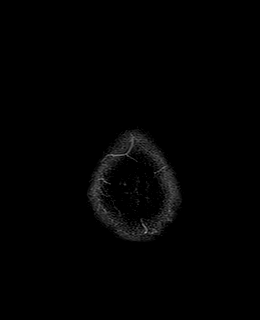

[Series 12: T1 · axial · 1.0mm · 0.94mm/px · z∈[-42,+115]mm · 12 of 160 slices shown (2 of 2)]
[im 1/160]
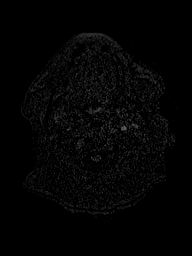
[im 15/160]
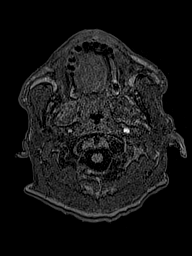
[im 29/160]
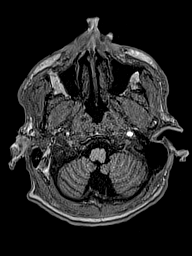
[im 44/160]
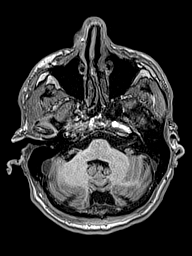
[im 58/160]
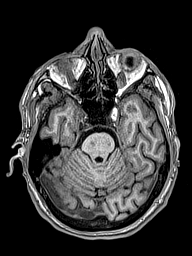
[im 73/160]
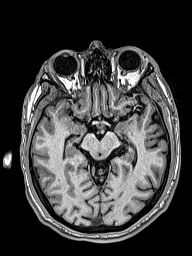
[im 87/160]
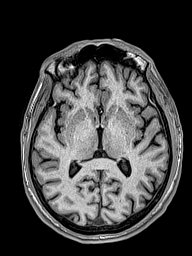
[im 102/160]
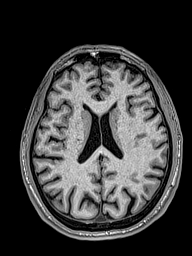
[im 116/160]
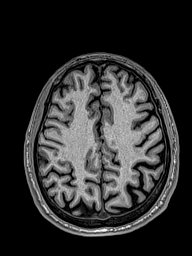
[im 131/160]
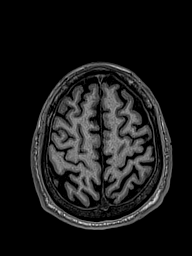
[im 145/160]
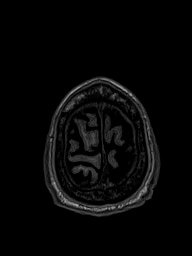
[im 160/160]
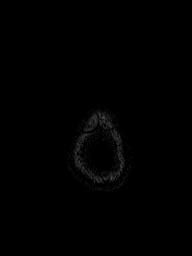

[Series 13: DWI · coronal · 5.0mm · 1.31mm/px · 5 of 72 slices shown (3 of 4)]
[im 1/72]
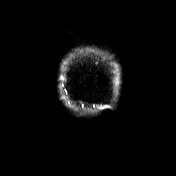
[im 18/72]
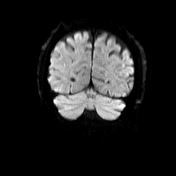
[im 36/72]
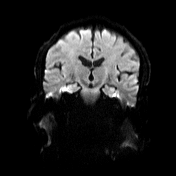
[im 54/72]
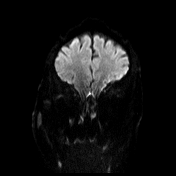
[im 72/72]
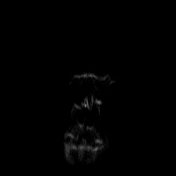

[Series 14: DWI · coronal · 5.0mm · 1.31mm/px · 3 of 36 slices shown (4 of 4)]
[im 1/36]
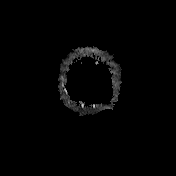
[im 18/36]
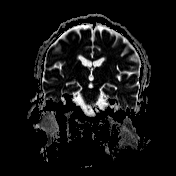
[im 36/36]
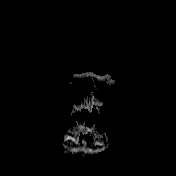

[Series 15: T2 · coronal · 5.0mm · 0.57mm/px · 2 of 32 slices shown (2 of 2)]
[im 1/32]
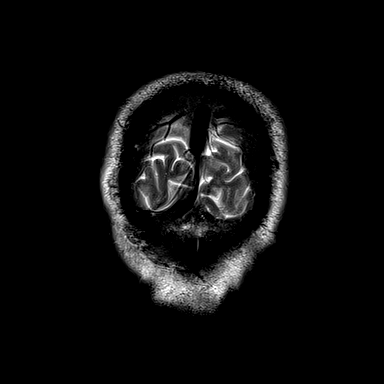
[im 32/32]
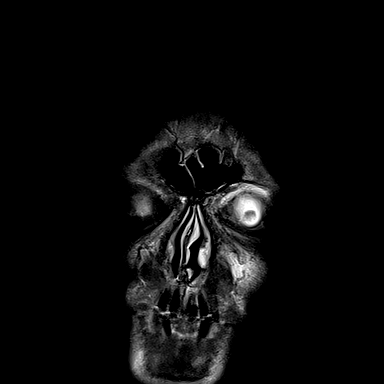

[48 of 48 positions shown; findings below may reference images not displayed]

FINDINGS: Brain: Ventricle size and cerebral volume normal. Negative for acute
infarct. Scattered small white matter hyperintensities bilaterally.
Mild hyperintensity in the pons right greater than left. Negative
for hemorrhage or mass. No midline shift.

Vascular: Normal arterial flow voids

Skull and upper cervical spine: No focal skeletal lesion.

Sinuses/Orbits: Mild mucosal edema paranasal sinuses and mastoid
sinus bilaterally. Negative orbit

Other: None
IMPRESSION: No acute abnormality. Mild chronic microvascular ischemic change in
the white matter.

## 2019-03-10 MED ORDER — SODIUM CHLORIDE 0.9% FLUSH: 3.0000 mL | Freq: Once | INTRAVENOUS | Status: DC

## 2019-03-10 MED ORDER — SODIUM ZIRCONIUM CYCLOSILICATE 10 G PO PACK
10.0000 g | PACK | Freq: Three times a day (TID) | ORAL | Status: DC
  Filled 2019-03-10: qty 1

## 2019-03-10 MED ORDER — SODIUM CHLORIDE 0.9 % IV BOLUS
1000.0000 mL | Freq: Once | INTRAVENOUS | Status: AC
  Administered 2019-03-10: 10:00:00 1000 mL via INTRAVENOUS

## 2019-03-10 MED ORDER — SODIUM ZIRCONIUM CYCLOSILICATE 10 G PO PACK
10.0000 g | PACK | Freq: Three times a day (TID) | ORAL | Status: AC
  Administered 2019-03-10 – 2019-03-11 (×3): 10 g via ORAL
  Filled 2019-03-10 (×3): qty 1

## 2019-03-10 MED ORDER — SODIUM CHLORIDE 0.9 % IV SOLN
500.0000 mg | Freq: Two times a day (BID) | INTRAVENOUS | Status: AC
  Administered 2019-03-10 – 2019-03-13 (×6): 500 mg via INTRAVENOUS
  Filled 2019-03-10 (×8): qty 4

## 2019-03-10 MED ORDER — ACETAMINOPHEN 650 MG RE SUPP: 650.0000 mg | Freq: Four times a day (QID) | RECTAL | Status: DC | PRN

## 2019-03-10 MED ORDER — ONDANSETRON HCL 4 MG PO TABS: 4.0000 mg | ORAL_TABLET | Freq: Four times a day (QID) | ORAL | Status: DC | PRN

## 2019-03-10 MED ORDER — SODIUM CHLORIDE 0.9 % IV SOLN: INTRAVENOUS | Status: DC

## 2019-03-10 MED ORDER — ONDANSETRON HCL 4 MG/2ML IJ SOLN
4.0000 mg | Freq: Once | INTRAMUSCULAR | Status: AC
  Administered 2019-03-10: 4 mg via INTRAVENOUS
  Filled 2019-03-10: qty 2

## 2019-03-10 MED ORDER — CALCIUM GLUCONATE-NACL 1-0.675 GM/50ML-% IV SOLN
1.0000 g | Freq: Once | INTRAVENOUS | Status: AC
  Administered 2019-03-10: 1000 mg via INTRAVENOUS
  Filled 2019-03-10: qty 50

## 2019-03-10 MED ORDER — INSULIN ASPART 100 UNIT/ML IV SOLN
10.0000 [IU] | Freq: Once | INTRAVENOUS | Status: AC
  Administered 2019-03-10: 10 [IU] via INTRAVENOUS

## 2019-03-10 MED ORDER — SODIUM CHLORIDE 0.9 % IV BOLUS
1000.0000 mL | Freq: Once | INTRAVENOUS | Status: AC
  Administered 2019-03-10: 1000 mL via INTRAVENOUS

## 2019-03-10 MED ORDER — INSULIN ASPART 100 UNIT/ML ~~LOC~~ SOLN
0.0000 [IU] | Freq: Three times a day (TID) | SUBCUTANEOUS | Status: DC
  Administered 2019-03-11: 1 [IU] via SUBCUTANEOUS
  Administered 2019-03-11: 2 [IU] via SUBCUTANEOUS
  Administered 2019-03-13: 1 [IU] via SUBCUTANEOUS
  Administered 2019-03-13 – 2019-03-16 (×2): 2 [IU] via SUBCUTANEOUS
  Administered 2019-03-17: 1 [IU] via SUBCUTANEOUS
  Administered 2019-03-18: 2 [IU] via SUBCUTANEOUS
  Administered 2019-03-19: 1 [IU] via SUBCUTANEOUS
  Administered 2019-03-20: 2 [IU] via SUBCUTANEOUS
  Administered 2019-03-20: 1 [IU] via SUBCUTANEOUS
  Administered 2019-03-21: 2 [IU] via SUBCUTANEOUS
  Administered 2019-03-23: 1 [IU] via SUBCUTANEOUS
  Administered 2019-03-25 – 2019-04-03 (×5): 2 [IU] via SUBCUTANEOUS

## 2019-03-10 MED ORDER — PREDNISONE 20 MG PO TABS
60.0000 mg | ORAL_TABLET | Freq: Once | ORAL | Status: AC
  Administered 2019-03-10: 60 mg via ORAL
  Filled 2019-03-10: qty 3

## 2019-03-10 MED ORDER — DEXTROSE 50 % IV SOLN
25.0000 g | Freq: Once | INTRAVENOUS | Status: AC
  Administered 2019-03-10: 25 g via INTRAVENOUS
  Filled 2019-03-10: qty 50

## 2019-03-10 MED ORDER — MECLIZINE HCL 25 MG PO TABS
50.0000 mg | ORAL_TABLET | Freq: Once | ORAL | Status: AC
  Administered 2019-03-10: 50 mg via ORAL
  Filled 2019-03-10: qty 2

## 2019-03-10 MED ORDER — ONDANSETRON HCL 4 MG/2ML IJ SOLN
4.0000 mg | Freq: Four times a day (QID) | INTRAMUSCULAR | Status: DC | PRN
  Administered 2019-03-20: 4 mg via INTRAVENOUS
  Filled 2019-03-10: qty 2

## 2019-03-10 MED ORDER — ACETAMINOPHEN 325 MG PO TABS
650.0000 mg | ORAL_TABLET | Freq: Four times a day (QID) | ORAL | Status: DC | PRN
  Administered 2019-03-21 – 2019-03-31 (×7): 650 mg via ORAL
  Filled 2019-03-10 (×5): qty 2

## 2019-03-10 NOTE — ED Triage Notes (Signed)
Pt complaint of ongoing dizziness for 4 days; "walking around like I'm drunk." Denies other.

## 2019-03-10 NOTE — ED Notes (Signed)
Attempted report x1.  RN unavailable to take report.

## 2019-03-10 NOTE — Consult Note (Signed)
Renal Service Consult Note [facility]  Paeton Studer 03/10/2019 Sol Blazing Requesting Physician:  Dr Olevia Bowens  Reason for Consult:  Renal failure HPI: The patient is a 59 y.o. year-old w/ no prior PMH presenting to ED c/o vertigo/ dizziness as well as nausea, vomiting and diarrhea x 4 days. In ED VSS, BP high 179/ 90, BUN 88 and creatinine 10.9.  IVF's ordered and pt admitted. Asked to see for renal failure.   Pt seen in room.  No hx chronic medical conditions, no hx kidney stones, kidney disease, no OTC med use, no change in urine color , no dysuria or frequency or difficulty voiding. States he was tired for 2 wks then last 4days developed nausea and vomiting. Also nauseous at smell of food. Denies any fatigue, SOB or CP. No abd pain.    +smoker 20 years, no sig ETOH , no illegal drug use.  Divorced, 2 grown dtrs, lived alone , now living w/ his sister.  Works as a Curator, was full time prior to [employer] but has not had work for last month or so.     ROS  denies CP  no joint pain   no HA  no blurry vision  no rash     Past Medical History History reviewed. No pertinent past medical history. Past Surgical History History reviewed. No pertinent surgical history. Family History  Family History  Problem Relation Age of Onset  . Hypertension Mother   . Diabetes Mellitus II Mother   . Hypertension Father   . Diabetes Mellitus II Father   . Diabetes Mellitus II Sister    Social History  reports that he has been smoking cigarettes. He has been smoking about 0.25 packs per day. He has never used smokeless tobacco. He reports previous alcohol use. He reports that he does not use drugs. Allergies No Known Allergies Home medications Prior to Admission medications   Not on File     Vitals:   03/10/19 0738 03/10/19 0803 03/10/19 1030 03/10/19 1058  BP: (!) 173/90 (!) 147/90  (!) 164/87  Pulse: 61 (!) 56 (!) 51 (!) 52  Resp: 16 15 16 16   Temp: 98.3 F (36.8 C)  98.3 F (36.8 C)  98.2 F (36.8 C)  TempSrc: Oral Oral  Oral  SpO2: 94% 98% 100% 99%   Exam Gen alert, WDWN, no distress No rash, cyanosis or gangrene Sclera anicteric, throat clear  No jvd or bruits., flat neck veins Chest clear bilat to bases no rales, wheezing or bronchial BS RRR no MRG Abd soft ntnd no mass or ascites +bs GU normal male MS no joint effusions or deformity Ext no LE or UE edema, no wounds or ulcers Neuro is alert, Ox 3 , nf    Home meds:  - none   Renal US > 11-11.5 cm kidneys, +echogenic, no hydro, bladder minimally distended UA > 100 prot, >50 rbc, 6-10 wbc, 0-5 epis, rare bact, yellow, hazy, 5.0  Urine sediment (by undersigned 3/8) > numerous casts , > 50 rbc's w/ sig dysmorphic changes, gran casts and probable degen RBC/ WBC casts   Assessment/ Plan: 1. Renal failure - in pt w/o prior medical problems, +smoker is only health issue I can see.  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  Hopefully some component of dehydration contributing to AKI.  Will send serologiies and start empiric IV bolus steroids, cont IVF"s and watch closely for signs of worsening uremia. If needed will do dialysis.  Will most  likely need renal biopsy soon if not sig improvement w/ IVF's.   2. Nausea/ vomiting - not sure cause of RF or secondary symptom. As above 3. HTN - BP's slightly high, pt is not vol overloaded, agree w/ IVF's. Goal SBP 120- 160 for now.       Kelly Splinter  MD 03/10/2019, 1:57 PM  Recent Labs  Lab 03/10/19 0755  WBC 9.6  HGB 11.6*   Recent Labs  Lab 03/10/19 0754  K 5.2*  BUN 88*  CREATININE 10.96*  CALCIUM 9.2

## 2019-03-10 NOTE — ED Notes (Signed)
Pt provided soda for fluid challenge.  Pt also aware that urine sample is needed, urinal at bedside.

## 2019-03-10 NOTE — ED Provider Notes (Signed)
[facility] DEPT Provider Note   CSN: [id number] Arrival date & time: 03/10/19  0732     History Chief Complaint  Patient presents with  . Dizziness    Gabriel Harris is a 59 y.o. male.  HPI  Patient is 59 year old male with no significant past medical history who is also not followed by PCP and has not seen medical practitioner in "many years ".  Patient states roughly 4 days ago he began having some vertiginous symptoms which included "feeling like I was drunk "with constant dizziness which has not abated since it began.  He states it was gradual in onset.  He denies any alcohol or drug use.  He states that the symptoms are worse with sitting and standing.  He states he also has a frontal headache and feels hot.  He also endorses double vision, nausea, vomiting and diarrhea.  He states that prior to his dizziness began he states he had not been eating very much because he had no appetite.  He states that the smell of food made him feel nauseous.  Patient does endorse roaring tinnitus in his left ear.      History reviewed. No pertinent past medical history.  Patient Active Problem List   Diagnosis Date Noted  . AKI (acute kidney injury) ([place]) 03/10/2019  . Hyperkalemia 03/10/2019  . Normocytic anemia 03/10/2019  . Hypoalbuminemia 03/10/2019    History reviewed. No pertinent surgical history.     Family History  Problem Relation Age of Onset  . Hypertension Mother   . Diabetes Mellitus II Mother   . Hypertension Father   . Diabetes Mellitus II Father   . Diabetes Mellitus II Sister     Social History   Tobacco Use  . Smoking status: Current Every Day Smoker    Packs/day: 0.25    Types: Cigarettes  . Smokeless tobacco: Never Used  Substance Use Topics  . Alcohol use: Not Currently  . Drug use: Never    Home Medications Prior to Admission medications   Not on File    Allergies    Patient has no known allergies.  Review  of Systems   Review of Systems  Constitutional: Positive for appetite change. Negative for chills and fever.  HENT: Positive for hearing loss (decreased left ear). Negative for congestion.   Eyes: Negative for pain.  Respiratory: Negative for cough and shortness of breath.   Cardiovascular: Negative for chest pain and leg swelling.  Gastrointestinal: Negative for abdominal pain and vomiting.  Genitourinary: Negative for dysuria.  Musculoskeletal: Negative for myalgias.       Right ankle pain  Skin: Negative for rash.  Neurological: Positive for dizziness, light-headedness and headaches.    Physical Exam Updated Vital Signs BP (!) 147/90 (BP Location: Left Arm)   Pulse (!) 56   Temp 98.3 F (36.8 C) (Oral)   Resp 15   SpO2 98%   Physical Exam Vitals and nursing note reviewed.  Constitutional:      General: He is not in acute distress. HENT:     Head: Normocephalic and atraumatic.     Right Ear: Tympanic membrane normal.     Left Ear: Tympanic membrane normal.     Ears:     Comments: No foreign body in the ear.  No erythema of EAC.  TM within normal limits.    Nose: Nose normal.  Eyes:     General: No scleral icterus. Cardiovascular:     Rate and Rhythm:  Normal rate and regular rhythm.     Pulses: Normal pulses.     Heart sounds: Normal heart sounds.  Pulmonary:     Effort: Pulmonary effort is normal. No respiratory distress.     Breath sounds: No wheezing.  Abdominal:     Palpations: Abdomen is soft.     Tenderness: There is no abdominal tenderness. There is no guarding or rebound.  Musculoskeletal:     Cervical back: Normal range of motion.     Right lower leg: No edema.     Left lower leg: No edema.     Comments: Tenderness palpation of right ankle of the lateral and posterior ankle.  No evidence of joint instability.  Sensation intact.  Strength 5/5.  Skin:    General: Skin is warm and dry.     Capillary Refill: Capillary refill takes less than 2 seconds.    Neurological:     Mental Status: He is alert. Mental status is at baseline.     Comments: Alert and oriented to self, place, time and event.   Speech is fluent, clear without dysarthria or dysphasia.   Strength 5/5 in upper/lower extremities  Sensation intact in upper/lower extremities   Normal gait-with some hesitancy, will hold onto bed railing for support but is able to ambulate.  Negative Romberg. No pronator drift.  Normal finger-to-nose and feet tapping.  CN I not tested  CN II grossly intact visual fields bilaterally. Did not visualize posterior eye.   CN III, IV, VI PERRLA and EOMs intact bilaterally --there is uni directional left beating nystagmus with left gaze deviation.  It seems to make his symptoms worse. CN V Intact sensation to sharp and light touch to the face  CN VII facial movements symmetric  CN VIII decreased hearing on left ear CN IX, X no uvula deviation, symmetric rise of soft palate  CN XI 5/5 SCM and trapezius strength bilaterally  CN XII Midline tongue protrusion, symmetric L/R movements   Psychiatric:        Mood and Affect: Mood normal.        Behavior: Behavior normal.     ED Results / Procedures / Treatments   Labs (all labs ordered are listed, but only abnormal results are displayed) Labs Reviewed  CBC - Abnormal; Notable for the following components:      Result Value   RBC 4.01 (*)    Hemoglobin 11.6 (*)    HCT 36.0 (*)    All other components within normal limits  URINALYSIS, ROUTINE W REFLEX MICROSCOPIC - Abnormal; Notable for the following components:   APPearance HAZY (*)    Hgb urine dipstick LARGE (*)    Protein, ur 100 (*)    RBC / HPF >50 (*)    Bacteria, UA RARE (*)    All other components within normal limits  COMPREHENSIVE METABOLIC PANEL - Abnormal; Notable for the following components:   Potassium 5.2 (*)    CO2 17 (*)    Glucose, Bld 108 (*)    BUN 88 (*)    Creatinine, Ser 10.96 (*)    Albumin 3.4 (*)    AST 10  (*)    GFR calc non Af Amer 5 (*)    GFR calc Af Amer 5 (*)    All other components within normal limits  CBG MONITORING, ED - Abnormal; Notable for the following components:   Glucose-Capillary 100 (*)    All other components within normal limits  SARS CORONAVIRUS  2 (TAT 6-24 HRS)    EKG None  Radiology DG Ankle Complete Right  Result Date: 03/10/2019 CLINICAL DATA:  Progressive pain EXAM: RIGHT ANKLE - COMPLETE 3+ VIEW COMPARISON:  None. FINDINGS: Frontal, oblique, and lateral views were obtained. No evident fracture or joint effusion. No appreciable joint space narrowing or erosion. Ankle mortise appears intact. There are multiple foci of arterial vascular calcification. IMPRESSION: No evident fracture or appreciable arthropathy. Ankle mortise appears intact. There are foci of arterial vascular calcification Electronically Signed   By: Lowella Grip III M.D.   On: 03/10/2019 08:35    Procedures Procedures (including critical care time)  Medications Ordered in ED Medications  sodium chloride flush (NS) 0.9 % injection 3 mL (has no administration in time range)  meclizine (ANTIVERT) tablet 50 mg (50 mg Oral Given 03/10/19 0802)  sodium chloride 0.9 % bolus 1,000 mL (0 mLs Intravenous Stopped 03/10/19 0942)  ondansetron (ZOFRAN) injection 4 mg (4 mg Intravenous Given 03/10/19 0806)  predniSONE (DELTASONE) tablet 60 mg (60 mg Oral Given 03/10/19 0802)  sodium chloride 0.9 % bolus 1,000 mL (1,000 mLs Intravenous New Bag/Given 03/10/19 0941)    ED Course  I have reviewed the triage vital signs and the nursing notes.  Pertinent labs & imaging results that were available during my care of the patient were reviewed by me and considered in my medical decision making (see chart for details).  Patient is 59 year old gentleman with no significant past medical history that he knows of.  Does not see PCP does not take any medications.  Has not been evaluated by medical professional in many years.   Presented today for vertiginous symptoms, headache, nausea, vomiting, diarrhea.  Has had decreased appetite and fatigue for the past 4 days.   Clinical Course as of Mar 09 1004  Mon Mar 10, 2019  0859 CBC with mild anemia with no comparison.   [WF]  0859 NP with elevated creatinine at nearly 11.  BUN 88.  This appears to be acute renal failure.  Mild elevation of potassium of 5.2.  Likely due to decreased renal clearance.   [WF]    Clinical Course User Index [WF] Tedd Sias, [place]   X-ray of right ankle independently viewed by myself.  No fracture or dislocation.  He does calcification of arteries.   10:06 AM discussed with Dr. Moshe Cipro of nephrology.  She recommends admission, second liter of normal saline and renal ultrasound.  She will see patient during hospitalization.  No indication for transfer to [facility] at this time--nephrology will reassess patient and follow patient during hospitalization and transfer to [facility] if dialysis is felt to be required.   Consulted hospitalist for admission. Dr. Olevia Bowens will admit. Appreciate his consultation.   Discussed with patient the results of all of his lab work.  He is understanding of need for admission at this time.  Covid lab results pending.  MDM Rules/Calculators/A&P                      Final Clinical Impression(s) / ED Diagnoses Final diagnoses:  AKI (acute kidney injury) ([place])  Dizziness  Dehydration  Vertigo    Rx / DC Orders ED Discharge Orders    None       Tedd Sias, [place] 03/10/19 1006    Milton Ferguson, MD 03/11/19 [phone]

## 2019-03-10 NOTE — H&P (Signed)
History and Physical    Gabriel Harris IOE:[id number] DOB: [birth year] DOA: 03/10/2019  PCP: Patient, No Pcp Per   Patient coming from: Home.  I have personally briefly reviewed patient's old medical records in [place]  Chief Complaint: Dizziness, nausea and vomiting.  HPI: Gabriel Harris is a 59 y.o. male with no previous medical history who is coming to the emergency department with complaints of 2 weeks of nausea, dizziness, tinnitus, diplopia, spinning sensation and 3 days of diarrhea associated with at least 5 episodes of emesis since yesterday.  He denies headache, blurred vision, focal weakness or numbness.  He denies fever, chills, rhinorrhea, sore throat, dyspnea, chest pain, palpitations, diaphoresis, lower extremity edema, abdominal pain, constipation, melena or hematochezia.  No dysuria, frequency or hematuria.  He denies polyuria, polydipsia, polyphagia or blurred vision.  ED Course: His initial vital signs were temperature 98.3 F, pulse 61, respirations 16, blood pressure 173/90 mmHg and O2 sat 94% on room air.  The patient has received 2 L of NS bolus, Zofran 4 mg IVP x1, meclizine 50 mg and prednisone 60 mg p.o. x1 dose.  Nephrology was contacted by the emergency department.  They did not request transfer to [facility], but recommend IVF, close monitoring of urine output and will evaluate in the morning.  He is urinalysis shows a hazy appearance, with large hemoglobinuria, proteinuria of 100 mg/dL.  Microscopic urine examination shows a few bacteria, 5-10 WBC and more than 50 RBC per hpf.  CBC shows a white count 9.6, hemoglobin 11.6 g/dL and platelets 244.  Sodium 138, potassium 5.2, chloride 109 CO2 17 mg/dL.  Glucose 108, BUN 88 and creatinine time 0.96 mg/dL.  No previous measurements are available to compare to.  LFTs show an albumin of 3.4 and AST decreased at 10 units/L, the rest of the hepatic functions are within expected range.  Review of Systems: As per HPI otherwise 10  point review of systems negative.   History reviewed. No pertinent past medical history.  History reviewed. No pertinent surgical history.   reports that he has been smoking cigarettes. He has been smoking about 0.25 packs per day. He has never used smokeless tobacco. He reports previous alcohol use. He reports that he does not use drugs.  No Known Allergies  Family History  Problem Relation Age of Onset  . Hypertension Mother   . Diabetes Mellitus II Mother   . Hypertension Father   . Diabetes Mellitus II Father   . Diabetes Mellitus II Sister    Prior to Admission medications   Not on File   Physical Exam: Vitals:   03/10/19 0738 03/10/19 0803 03/10/19 1030  BP: (!) 173/90 (!) 147/90   Pulse: 61 (!) 56 (!) 51  Resp: 16 15 16   Temp: 98.3 F (36.8 C) 98.3 F (36.8 C)   TempSrc: Oral Oral   SpO2: 94% 98% 100%    Constitutional: NAD, calm, comfortable Eyes: PERRL, lids and conjunctivae normal ENMT: Mucous membranes are mildly dry. Posterior pharynx clear of any exudate or lesions. Neck: normal, supple, no masses, no thyromegaly Respiratory: clear to auscultation bilaterally, no wheezing, no crackles. Normal respiratory effort. No accessory muscle use.  Cardiovascular: Bradycardic at 56 bpm, no murmurs / rubs / gallops. No extremity edema. 2+ pedal pulses. No carotid bruits.  Abdomen: no tenderness, no masses palpated. No hepatosplenomegaly. Bowel sounds positive.  Musculoskeletal: Positive lateral-posterior right ankle tenderness.  No clubbing / cyanosis. Good ROM, no contractures. Normal muscle tone.  Skin: no  significant rashes, lesions, ulcers on limited dermatological examination. Neurologic: CN 2-12 grossly intact. Sensation intact, DTR normal. Strength 5/5 in all 4.  No pronator drift.  ED performed Romberg test earlier, which was negative. Psychiatric: Normal judgment and insight. Alert and oriented x 3. Normal mood.   Labs on Admission: I have personally reviewed  following labs and imaging studies  CBC: Recent Labs  Lab 03/10/19 0755  WBC 9.6  HGB 11.6*  HCT 36.0*  MCV 89.8  PLT 086   Basic Metabolic Panel: Recent Labs  Lab 03/10/19 0754  NA 138  K 5.2*  CL 109  CO2 17*  GLUCOSE 108*  BUN 88*  CREATININE 10.96*  CALCIUM 9.2   GFR: CrCl cannot be calculated (Unknown ideal weight.). Liver Function Tests: Recent Labs  Lab 03/10/19 0754  AST 10*  ALT 16  ALKPHOS 78  BILITOT 0.6  PROT 7.5  ALBUMIN 3.4*   No results for input(s): LIPASE, AMYLASE in the last 168 hours. No results for input(s): AMMONIA in the last 168 hours. Coagulation Profile: No results for input(s): INR, PROTIME in the last 168 hours. Cardiac Enzymes: No results for input(s): CKTOTAL, CKMB, CKMBINDEX, TROPONINI in the last 168 hours. BNP (last 3 results) No results for input(s): PROBNP in the last 8760 hours. HbA1C: No results for input(s): HGBA1C in the last 72 hours. CBG: Recent Labs  Lab 03/10/19 0753  GLUCAP 100*   Lipid Profile: No results for input(s): CHOL, HDL, LDLCALC, TRIG, CHOLHDL, LDLDIRECT in the last 72 hours. Thyroid Function Tests: No results for input(s): TSH, T4TOTAL, FREET4, T3FREE, THYROIDAB in the last 72 hours. Anemia Panel: No results for input(s): VITAMINB12, FOLATE, FERRITIN, TIBC, IRON, RETICCTPCT in the last 72 hours. Urine analysis:    Component Value Date/Time   COLORURINE YELLOW 03/10/2019 0933   APPEARANCEUR HAZY (A) 03/10/2019 0933   LABSPEC 1.010 03/10/2019 0933   PHURINE 5.0 03/10/2019 0933   GLUCOSEU NEGATIVE 03/10/2019 0933   HGBUR LARGE (A) 03/10/2019 0933   BILIRUBINUR NEGATIVE 03/10/2019 0933   KETONESUR NEGATIVE 03/10/2019 0933   PROTEINUR 100 (A) 03/10/2019 0933   NITRITE NEGATIVE 03/10/2019 0933   LEUKOCYTESUR NEGATIVE 03/10/2019 0933    Radiological Exams on Admission: DG Ankle Complete Right  Result Date: 03/10/2019 CLINICAL DATA:  Progressive pain EXAM: RIGHT ANKLE - COMPLETE 3+ VIEW  COMPARISON:  None. FINDINGS: Frontal, oblique, and lateral views were obtained. No evident fracture or joint effusion. No appreciable joint space narrowing or erosion. Ankle mortise appears intact. There are multiple foci of arterial vascular calcification. IMPRESSION: No evident fracture or appreciable arthropathy. Ankle mortise appears intact. There are foci of arterial vascular calcification Electronically Signed   By: Lowella Grip III M.D.   On: 03/10/2019 08:35    EKG: Independently reviewed. Vent. rate 53 BPM PR interval * ms QRS duration 84 ms QT/QTc 436/410 ms P-R-T axes 31 48 66 Sinus bradycardia 53 bpm.  Assessment/Plan Principal Problem:   AKI (acute kidney injury) ([place]) Suspect ATN due to acute dehydration. However, other etiology may be possible. Continue IV fluids. Monitor intake and output. Renal diet with soft fluid restriction. Follow-up renal function electrolytes. Obtain renal ultrasound. Nephrology will evaluate.  Active Problems:   Diplopia/vertigo Meclizine as needed. Continue IV fluids. Check MRI of brain.    Hyperkalemia Secondary to AKI. Continue IV fluids. Follow-up potassium level.    Normocytic anemia Recent renal disease? Monitor H&H.    Hypoalbuminemia Likely due to renal loss and/or decreased nutrition. Monitor for albuminuria  and albumin level.    Sinus bradycardia Hyperkalemia? Vasovagal? Continue IV fluids. Continue cardiac telemetry.    Tobacco use Advised to cease smoking. Declined nicotine replacement therapy. Staff to provide tobacco cessation information.   DVT prophylaxis: SCDs. Code Status: Full code. Family Communication: Disposition Plan: Observation for DKA treatment and PRBC transfusion. Consults called: Admission status: Observation/telemetry.   Reubin Milan MD [facility]  If 7PM-7AM, please contact night-coverage [email or web address]  03/10/2019, 10:38 AM   This document was prepared using  Dragon voice recognition software and may contain some unintended transcription errors.

## 2019-03-11 DIAGNOSIS — E8809 Other disorders of plasma-protein metabolism, not elsewhere classified: Secondary | ICD-10-CM | POA: Diagnosis present

## 2019-03-11 DIAGNOSIS — N189 Chronic kidney disease, unspecified: Secondary | ICD-10-CM | POA: Diagnosis present

## 2019-03-11 DIAGNOSIS — D631 Anemia in chronic kidney disease: Secondary | ICD-10-CM | POA: Diagnosis present

## 2019-03-11 DIAGNOSIS — Z833 Family history of diabetes mellitus: Secondary | ICD-10-CM | POA: Diagnosis not present

## 2019-03-11 DIAGNOSIS — M317 Microscopic polyangiitis: Secondary | ICD-10-CM | POA: Diagnosis present

## 2019-03-11 DIAGNOSIS — I776 Arteritis, unspecified: Secondary | ICD-10-CM | POA: Diagnosis present

## 2019-03-11 DIAGNOSIS — D72829 Elevated white blood cell count, unspecified: Secondary | ICD-10-CM | POA: Diagnosis not present

## 2019-03-11 DIAGNOSIS — I7789 Other specified disorders of arteries and arterioles: Secondary | ICD-10-CM | POA: Diagnosis present

## 2019-03-11 DIAGNOSIS — I129 Hypertensive chronic kidney disease with stage 1 through stage 4 chronic kidney disease, or unspecified chronic kidney disease: Secondary | ICD-10-CM | POA: Diagnosis present

## 2019-03-11 DIAGNOSIS — Z20822 Contact with and (suspected) exposure to covid-19: Secondary | ICD-10-CM | POA: Diagnosis present

## 2019-03-11 DIAGNOSIS — N2581 Secondary hyperparathyroidism of renal origin: Secondary | ICD-10-CM | POA: Diagnosis present

## 2019-03-11 DIAGNOSIS — N179 Acute kidney failure, unspecified: Secondary | ICD-10-CM | POA: Diagnosis present

## 2019-03-11 DIAGNOSIS — E872 Acidosis: Secondary | ICD-10-CM | POA: Diagnosis present

## 2019-03-11 DIAGNOSIS — R001 Bradycardia, unspecified: Secondary | ICD-10-CM | POA: Diagnosis present

## 2019-03-11 DIAGNOSIS — Z590 Homelessness: Secondary | ICD-10-CM | POA: Diagnosis not present

## 2019-03-11 DIAGNOSIS — H532 Diplopia: Secondary | ICD-10-CM | POA: Diagnosis present

## 2019-03-11 DIAGNOSIS — E875 Hyperkalemia: Secondary | ICD-10-CM | POA: Diagnosis present

## 2019-03-11 DIAGNOSIS — J8410 Pulmonary fibrosis, unspecified: Secondary | ICD-10-CM | POA: Diagnosis present

## 2019-03-11 DIAGNOSIS — J449 Chronic obstructive pulmonary disease, unspecified: Secondary | ICD-10-CM | POA: Diagnosis present

## 2019-03-11 DIAGNOSIS — Z8249 Family history of ischemic heart disease and other diseases of the circulatory system: Secondary | ICD-10-CM | POA: Diagnosis not present

## 2019-03-11 DIAGNOSIS — E860 Dehydration: Secondary | ICD-10-CM | POA: Diagnosis present

## 2019-03-11 DIAGNOSIS — F17210 Nicotine dependence, cigarettes, uncomplicated: Secondary | ICD-10-CM | POA: Diagnosis present

## 2019-03-11 LAB — COMPREHENSIVE METABOLIC PANEL
ALT: 12 U/L (ref 0–44)
AST: 10 U/L — ABNORMAL LOW (ref 15–41)
Albumin: 2.9 g/dL — ABNORMAL LOW (ref 3.5–5.0)
Alkaline Phosphatase: 60 U/L (ref 38–126)
Anion gap: 9 (ref 5–15)
BUN: 91 mg/dL — ABNORMAL HIGH (ref 6–20)
CO2: 15 mmol/L — ABNORMAL LOW (ref 22–32)
Calcium: 8.5 mg/dL — ABNORMAL LOW (ref 8.9–10.3)
Chloride: 115 mmol/L — ABNORMAL HIGH (ref 98–111)
Creatinine, Ser: 11.19 mg/dL — ABNORMAL HIGH (ref 0.61–1.24)
GFR calc Af Amer: 5 mL/min — ABNORMAL LOW (ref >60)
GFR calc non Af Amer: 4 mL/min — ABNORMAL LOW (ref >60)
Glucose, Bld: 140 mg/dL — ABNORMAL HIGH (ref 70–99)
Potassium: 5.8 mmol/L — ABNORMAL HIGH (ref 3.5–5.1)
Sodium: 139 mmol/L (ref 135–145)
Total Bilirubin: 0.3 mg/dL (ref 0.3–1.2)
Total Protein: 6.4 g/dL — ABNORMAL LOW (ref 6.5–8.1)

## 2019-03-11 LAB — CBC
HCT: 29.1 % — ABNORMAL LOW (ref 39.0–52.0)
Hemoglobin: 9.2 g/dL — ABNORMAL LOW (ref 13.0–17.0)
MCH: 29.1 pg (ref 26.0–34.0)
MCHC: 31.6 g/dL (ref 30.0–36.0)
MCV: 92.1 fL (ref 80.0–100.0)
Platelets: 203 10*3/uL (ref 150–400)
RBC: 3.16 MIL/uL — ABNORMAL LOW (ref 4.22–5.81)
RDW: 12.9 % (ref 11.5–15.5)
WBC: 10.2 10*3/uL (ref 4.0–10.5)
nRBC: 0 % (ref 0.0–0.2)

## 2019-03-11 LAB — KAPPA/LAMBDA LIGHT CHAINS
Kappa free light chain: 133.2 mg/L — ABNORMAL HIGH (ref 3.3–19.4)
Kappa, lambda light chain ratio: 1.6 (ref 0.26–1.65)
Lambda free light chains: 83 mg/L — ABNORMAL HIGH (ref 5.7–26.3)

## 2019-03-11 LAB — ANTI-DNA ANTIBODY, DOUBLE-STRANDED: ds DNA Ab: 1 IU/mL (ref 0–9)

## 2019-03-11 LAB — GLUCOSE, CAPILLARY
Glucose-Capillary: 139 mg/dL — ABNORMAL HIGH (ref 70–99)
Glucose-Capillary: 192 mg/dL — ABNORMAL HIGH (ref 70–99)
Glucose-Capillary: 174 mg/dL — ABNORMAL HIGH (ref 70–99)
Glucose-Capillary: 135 mg/dL — ABNORMAL HIGH (ref 70–99)
Glucose-Capillary: 171 mg/dL — ABNORMAL HIGH (ref 70–99)
Glucose-Capillary: 132 mg/dL — ABNORMAL HIGH (ref 70–99)

## 2019-03-11 LAB — FANA STAINING PATTERNS: Homogeneous Pattern: 1:80 {titer}

## 2019-03-11 LAB — MPO/PR-3 (ANCA) ANTIBODIES
ANCA Proteinase 3: <3.5 U/mL (ref 0.0–3.5)
Myeloperoxidase Abs: 94.3 U/mL — ABNORMAL HIGH (ref 0.0–9.0)

## 2019-03-11 LAB — GLOMERULAR BASEMENT MEMBRANE ANTIBODIES: GBM Ab: 3 units (ref 0–20)

## 2019-03-11 LAB — ANTISTREPTOLYSIN O TITER: ASO: 69 IU/mL (ref 0.0–200.0)

## 2019-03-11 LAB — HEMOGLOBIN A1C
Hgb A1c MFr Bld: 5.8 % — ABNORMAL HIGH (ref 4.8–5.6)
Mean Plasma Glucose: 119.76 mg/dL

## 2019-03-11 LAB — ANTINUCLEAR ANTIBODIES, IFA: ANA Ab, IFA: POSITIVE — AB

## 2019-03-11 LAB — CORTISOL-AM, BLOOD: Cortisol - AM: 6.8 ug/dL (ref 6.7–22.6)

## 2019-03-11 LAB — C4 COMPLEMENT: Complement C4, Body Fluid: 36 mg/dL (ref 12–38)

## 2019-03-11 LAB — C3 COMPLEMENT: C3 Complement: 97 mg/dL (ref 82–167)

## 2019-03-11 MED ORDER — ALUM & MAG HYDROXIDE-SIMETH 200-200-20 MG/5ML PO SUSP
30.0000 mL | Freq: Once | ORAL | Status: AC
  Administered 2019-03-11: 17:00:00 30 mL via ORAL
  Filled 2019-03-11: qty 30

## 2019-03-11 MED ORDER — CEFAZOLIN SODIUM-DEXTROSE 2-4 GM/100ML-% IV SOLN
2.0000 g | Freq: Once | INTRAVENOUS | Status: AC
  Administered 2019-03-12: 2 g via INTRAVENOUS
  Filled 2019-03-11: qty 100

## 2019-03-11 MED ORDER — LIDOCAINE VISCOUS HCL 2 % MT SOLN
15.0000 mL | Freq: Once | OROMUCOSAL | Status: AC
  Administered 2019-03-11: 17:00:00 15 mL via ORAL
  Filled 2019-03-11: qty 15

## 2019-03-11 MED ORDER — SODIUM BICARBONATE 8.4 % IV SOLN
100.0000 meq | Freq: Once | INTRAVENOUS | Status: AC
  Administered 2019-03-11: 11:00:00 100 meq via INTRAVENOUS
  Filled 2019-03-11: qty 100

## 2019-03-11 NOTE — Progress Notes (Signed)
Attempted to insert foley cath x2.  1st attempt was w/ a 70F and I was meeting resistance at the opening of the penis.  2nd attempt was w/ a 22F and I continued to meet resistance at the opening of the penis.  After consulting w/ Charge nurse ND, RN. She called 4th floor and requested their assistance w/ a Coude cath.  4th floor nurses attempted to insert the Coude cath and meet the same resistance at the opening of the penis. They spoke w/ Dr. Jonnie Finner and he stated that patient doesn't need the cath at this time.  Notified Dr. Nevada Crane by secure chat.

## 2019-03-11 NOTE — Consult Note (Signed)
Chief Complaint: Acute renal failure  Referring Physician(s): Dr. Mickel Crow  Supervising Physician: Sandi Mariscal  Patient Status: [facility] - In-pt  History of Present Illness: Gabriel Harris is a 59 y.o. male Presented to [facility] ED with persistent and worsening dizziness X 2 weeks found to be in acute renal failure. Team is requesting hemodialysis catheter and kidney biospy for further determination of causation.  History reviewed. No pertinent past medical history.  History reviewed. No pertinent surgical history.  Allergies: Patient has no known allergies.  Medications: Prior to Admission medications   Not on File     Family History  Problem Relation Age of Onset  . Hypertension Mother   . Diabetes Mellitus II Mother   . Hypertension Father   . Diabetes Mellitus II Father   . Diabetes Mellitus II Sister     Social History   Socioeconomic History  . Marital status: Single    Spouse name: Not on file  . Number of children: Not on file  . Years of education: Not on file  . Highest education level: Not on file  Occupational History  . Not on file  Tobacco Use  . Smoking status: Current Every Day Smoker    Packs/day: 0.25    Types: Cigarettes  . Smokeless tobacco: Never Used  Substance and Sexual Activity  . Alcohol use: Not Currently  . Drug use: Never  . Sexual activity: Not on file  Other Topics Concern  . Not on file  Social History Narrative  . Not on file   Social Determinants of Health   Financial Resource Strain:   . Difficulty of Paying Living Expenses: Not on file  Food Insecurity:   . Worried About Charity fundraiser in the Last Year: Not on file  . Ran Out of Food in the Last Year: Not on file  Transportation Needs:   . Lack of Transportation (Medical): Not on file  . Lack of Transportation (Non-Medical): Not on file  Physical Activity:   . Days of Exercise per Week: Not on file  . Minutes of Exercise per Session: Not on file    Stress:   . Feeling of Stress : Not on file  Social Connections:   . Frequency of Communication with Friends and Family: Not on file  . Frequency of Social Gatherings with Friends and Family: Not on file  . Attends Religious Services: Not on file  . Active Member of Clubs or Organizations: Not on file  . Attends Archivist Meetings: Not on file  . Marital Status: Not on file    Review of Systems: A 12 point ROS discussed and pertinent positives are indicated in the HPI above.  All other systems are negative.  Review of Systems  Constitutional: Negative for fever.  HENT: Negative for congestion.   Respiratory: Negative for cough and shortness of breath.   Cardiovascular: Negative for chest pain.  Gastrointestinal: Negative for abdominal pain.  Neurological: Positive for dizziness ( when standing). Negative for headaches.  Psychiatric/Behavioral: Negative for behavioral problems and confusion.    Vital Signs: BP 123/74 (BP Location: Right Arm)   Pulse (!) 46   Temp (!) 97.2 F (36.2 C) (Oral)   Resp 18   Wt 152 lb 5.4 oz (69.1 kg)   SpO2 99%   Physical Exam Vitals and nursing note reviewed.  Constitutional:      Appearance: He is well-developed.  HENT:     Head: Normocephalic.  Cardiovascular:  Rate and Rhythm: Normal rate and regular rhythm.     Pulses: Normal pulses.     Heart sounds: Normal heart sounds.  Pulmonary:     Effort: Pulmonary effort is normal.     Breath sounds: Normal breath sounds.  Musculoskeletal:        General: Normal range of motion.     Cervical back: Normal range of motion.  Skin:    General: Skin is dry.  Neurological:     Mental Status: He is alert and oriented to person, place, and time.     Imaging: DG Ankle Complete Right  Result Date: 03/10/2019 CLINICAL DATA:  Progressive pain EXAM: RIGHT ANKLE - COMPLETE 3+ VIEW COMPARISON:  None. FINDINGS: Frontal, oblique, and lateral views were obtained. No evident fracture or  joint effusion. No appreciable joint space narrowing or erosion. Ankle mortise appears intact. There are multiple foci of arterial vascular calcification. IMPRESSION: No evident fracture or appreciable arthropathy. Ankle mortise appears intact. There are foci of arterial vascular calcification Electronically Signed   By: Lowella Grip III M.D.   On: 03/10/2019 08:35   MR BRAIN WO CONTRAST  Result Date: 03/10/2019 CLINICAL DATA:  Subacute neuro deficit.  Diarrhea and vomiting EXAM: MRI HEAD WITHOUT CONTRAST TECHNIQUE: Multiplanar, multiecho pulse sequences of the brain and surrounding structures were obtained without intravenous contrast. COMPARISON:  CT head 11/30/2004 FINDINGS: Brain: Ventricle size and cerebral volume normal. Negative for acute infarct. Scattered small white matter hyperintensities bilaterally. Mild hyperintensity in the pons right greater than left. Negative for hemorrhage or mass. No midline shift. Vascular: Normal arterial flow voids Skull and upper cervical spine: No focal skeletal lesion. Sinuses/Orbits: Mild mucosal edema paranasal sinuses and mastoid sinus bilaterally. Negative orbit Other: None IMPRESSION: No acute abnormality. Mild chronic microvascular ischemic change in the white matter. Electronically Signed   By: Franchot Gallo M.D.   On: 03/10/2019 14:04   US Renal  Result Date: 03/10/2019 CLINICAL DATA:  New onset renal failure. EXAM: RENAL / URINARY TRACT ULTRASOUND COMPLETE COMPARISON:  None. FINDINGS: Right Kidney: Renal measurements: 11.1 x 4.1 x 5.0 cm = volume: 120 mL. Increased parenchymal echogenicity. No mass, stone or hydronephrosis. Left Kidney: Renal measurements: 11.6 x 5.6 x 5.1 cm = volume: 172 mL. Increased parenchymal echogenicity. Medial midpole cyst measuring 1.7 x 1.3 x 1.4 cm. No other masses, no stones and no hydronephrosis. Bladder: Minimally distended.  Otherwise unremarkable. Other: None. IMPRESSION: 1. Bilateral increased renal parenchymal  echogenicity consistent with medical renal disease. 2. No hydronephrosis.  No acute finding. 3. 1.7 cm left renal cyst. Electronically Signed   By: Lajean Manes M.D.   On: 03/10/2019 11:38    Labs:  CBC: Recent Labs    03/10/19 0755 03/11/19 0604  WBC 9.6 10.2  HGB 11.6* 9.2*  HCT 36.0* 29.1*  PLT 244 203    COAGS: No results for input(s): INR, APTT in the last 8760 hours.  BMP: Recent Labs    03/10/19 0754 03/10/19 1446 03/10/19 2034 03/11/19 0604  NA 138 138 137 139  K 5.2* 6.2* 6.1* 5.8*  CL 109 112* 112* 115*  CO2 17* 19* 16* 15*  GLUCOSE 108* 143* 246* 140*  BUN 88* 85* 85* 91*  CALCIUM 9.2 8.2* 7.8* 8.5*  CREATININE 10.96* 10.58* 10.60* 11.19*  GFRNONAA 5* 5* 5* 4*  GFRAA 5* 6* 6* 5*    LIVER FUNCTION TESTS: Recent Labs    03/10/19 0754 03/11/19 0604  BILITOT 0.6 0.3  AST 10*  10*  ALT 16 12  ALKPHOS 78 60  PROT 7.5 6.4*  ALBUMIN 3.4* 2.9*    TUMOR MARKERS: No results for input(s): AFPTM, CEA, CA199, CHROMGRNA in the last 8760 hours.  Assessment and Plan:  59 y.o, male inpatient. Presented to [facility] ED with persistent and worsening dizziness X 2 weeks found to be in acute renal failure. Team is requesting hemodialysis catheter and kidney biospy for further determination of causation.  Pertinent Imaging 9.8.21 - US renal reads Bilateral increased renal parenchymal echogenicity consistent with medical renal disease  Pertinent IR History none  Pertinent Allergies NKDA  Cr 11.19, BUN 91, Potassium 5.8 All other labs and medications are within acceptable parameters.  Patient is afebrile.  Risks and benefits of kidney biopsy was discussed with the patient and/or patient's family including, but not limited to bleeding, infection, damage to adjacent structures or low yield requiring additional tests.  Risks and benefits of Hemodialysis catheter discussed with the patient including, but not limited to bleeding, infection, vascular injury,  pneumothorax which may require chest tube placement, air embolism or even death  All of the patient's questions were answered, patient is agreeable to proceed. Consent signed and in chart.  Patient tentatively scheduled for procedure for 3.10.21. Per nephrology patient will be moved over to [place] is for procedure to be performed there. If patient has not been transferred plan for procedures to be performed at [facility] on 3.10.21 while awaiting transfer. Team instructed to: Keep Patient to be NPO after midnight   IR will call patient when ready.     Thank you for this interesting consult.  I greatly enjoyed meeting Gabriel Harris and look forward to participating in their care.  A copy of this report was sent to the requesting provider on this date.  Electronically Signed: Avel Peace, NP 03/11/2019, 4:05 PM   I spent a total of 40 Minutes    in face to face in clinical consultation, greater than 50% of which was counseling/coordinating care for hemodialysis catheter placement and kidney biopsy.

## 2019-03-11 NOTE — Progress Notes (Addendum)
[facility] Progress Note  Subjective: 500 cc UOP yest, creat up 11.19 today, BUN 91. Eating okay, slight nausea, no vomiting, no jerking. Foley cath ordered, mult attempts failed including coude would not pass.  Pt says is emptying bladder, bladder scan ordered.   Vitals:   03/10/19 1600 03/10/19 2143 03/11/19 0449 03/11/19 0456  BP:  116/64 119/73   Pulse:  (!) 52 (!) 51   Resp:  16 18   Temp:  98.3 F (36.8 C) 97.7 F (36.5 C)   TempSrc:      SpO2:  97% 100%   Weight: 73.8 kg   69.1 kg    Exam: Gen alert, WDWN, no distress No jvd or bruits Chest clear bilat to bases RRR no MRG Abd soft ntnd no mass or ascites +bs GU normal male MS no joint effusions or deformity Ext no LE edema Neuro is alert, Ox 3 , nf    Home meds:  - none   Renal US > 11-11.5 cm kidneys, +echogenic, no hydro, bladder minimally distended UA > 100 prot, >50 rbc, 6-10 wbc, 0-5 epis, rare bact, yellow, hazy, 5.0  Urine sediment (by undersigned 3/8) > numerous casts , > 50 rbc's w/ sig dysmorphic changes, gran casts and probable degen RBC/ WBC casts   Serologies/ other:   - anti GBM, CPK, ASO, HIV, hep C, hep B, C3/ C4 are wnl  - anca/ ANA / serum FLC / SPEP - pending  Assessment/ Plan: 1. Renal failure - in pt w/o prior medical problems, +smoker is only health issue I can see.  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  No signs of obstruction on [place]. Not improving w/ IVF's, will lower IVF to 75/hr. Foley could not be passed, can go without for now, will order bladder scan. Not sig uremic. Needs kidney biopsy and approaching need for dialysis. Transfer to [facility] today. Pt/ sister advised and questions answered. Consulting IR for procedures.  2. Hyperkalemia - renal diet, lokelma 10 tid 3. HTN - BP's slightly high, pt euvolemic on exam. Lower IVF's 75/hr. Goal SBP 120- 160 for now. Did standing BP's and they are normal 125/ 78.  4. Smoker    Gabriel Harris 03/11/2019, 1:48 PM   Recent Labs   Lab 03/10/19 0755 03/10/19 1446 03/10/19 2034 03/11/19 0604  K  --    < > 6.1* 5.8*  BUN  --    < > 85* 91*  CREATININE  --    < > 10.60* 11.19*  CALCIUM  --    < > 7.8* 8.5*  HGB 11.6*  --   --  9.2*   < > = values in this interval not displayed.   Inpatient medications: . insulin aspart  0-9 Units Subcutaneous TID WC  . sodium zirconium cyclosilicate  10 g Oral TID   . sodium chloride 75 mL/hr at 03/11/19 1038  . methylPREDNISolone (SOLU-MEDROL) injection 500 mg (03/11/19 0551)   acetaminophen **OR** acetaminophen, ondansetron **OR** ondansetron (ZOFRAN) IV

## 2019-03-11 NOTE — Progress Notes (Signed)
Called and gave report to Cecille Rubin, Therapist, sports (nurse at [place])   Called [facility]  Patient left room w/ [facility] at [facility]

## 2019-03-11 NOTE — Progress Notes (Signed)
PROGRESS NOTE  Gabriel Harris KZS:[id number] DOB: January 05, 1960 DOA: 03/10/2019 PCP: Patient, No Pcp Per  HPI/Recap of past 24 hours:  Gabriel Harris is a 59 y.o. male with no significant past medical history except for tobacco use disorder who presented to the [facility] ED with complaints of 2 weeks of worsening dizziness, nausea, tinnitus, diplopia, spinning sensation.  Associated with 3 days of diarrhea and at least 5 episodes of emesis since yesterday.  In the ED, MRI negative for any intracranial findings.  Was found to have significantly elevated creatinine greater than 10.  Was started on IV fluid and nephrology was consulted.  Work-up showed concern for possible GN.  Unfortunately creatinine continues to trend up at >11 with oliguria.  Nephrology recommended transfer to [facility] for possible hemodialysis.  03/11/19: Seen and examined at bedside.  His main concern is persistent dizziness and diplopia.  Minimal urine output recorded this morning, will insert a Foley catheter for accuracy of measurements.   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 UA showed hyaline cast Nephrology directing care Currently on IV Solu-Medrol 500 mg twice daily x6 doses and normal saline at 75 cc/h Minimal urine output Will insert a Foley catheter for accuracy of measurements. Continue strict I's and O's  Avoid nephrotoxins and hypotension Worsening renal function with creatinine up to greater than 11 BUN 91 from 85 Transferred to [facility] for possible hemodialysis, may need biopsy soon Continue daily  BMP  Dizziness, unclear etiology MRI brain no acute intracranial findings Obtain orthostatic vital signs when more stable PT OT to assess when more stable Fall precaution  Hyperkalemia secondary to worsening renal function Slowly improving from 6.1 to 5.8. Currently on Lokelma 3 times daily Amps of bicarb given which may help a little  Non anion gap metabolic acidosis secondary to acute renal failure Worsening chemistry bicarb 15, anion gap 9 Given 2 Amps of bicarb  Sinus bradycardia Reviewed twelve-lead EKG 03/10/2019 which showed sinus bradycardia rate of 53 with no evidence of heart block or acute ischemia TSH normal  Normocytic anemia in the setting of acute renal failure Hemoglobin currently stable at 11.6 No evidence of overt bleeding Continue to monitor  Tobacco use disorder Nicotine patch as needed   DVT prophylaxis: SCDs. Code Status: Full code. Family Communication:   Disposition Plan:  Discharge from home.  Anticipate discharge to home in the next 3 to 4 days when renal function is improved and nephrology has signed off.  Barrier to discharge: Worsening severe AKI with oliguria with plan for possible biopsies.   Consults called: Nephrology.    Objective: Vitals:   03/10/19 1600 03/10/19 2143 03/11/19 0449 03/11/19 0456  BP:  116/64 119/73   Pulse:  (!) 52 (!) 51   Resp:  16 18   Temp:  98.3 F (36.8 C) 97.7 F (36.5 C)   TempSrc:      SpO2:  97% 100%   Weight: 73.8 kg   69.1 kg    Intake/Output Summary (Last 24 hours) at 03/11/2019 1034 Last data filed at 03/11/2019 0832 Gross per 24 hour  Intake 1518.57 ml  Output 525 ml  Net 993.57 ml   Filed Weights   03/10/19 1600 03/11/19 0456  Weight: 73.8 kg 69.1 kg    Exam:  . General: 59 y.o. year-old male well developed well nourished in no acute distress.  Alert and oriented x3. . Cardiovascular: Regular rate and rhythm with no rubs or gallops.  No thyromegaly or JVD noted.   Marland Kitchen Respiratory: Clear  to auscultation with no wheezes or rales. Good inspiratory effort. . Abdomen: Soft nontender nondistended with normal bowel sounds x4 quadrants. . Musculoskeletal: No lower extremity edema. 2/4 pulses in all 4 extremities. Marland Kitchen Psychiatry: Mood is appropriate for condition and setting   Data Reviewed: CBC: Recent Labs  Lab 03/10/19 0755  WBC 9.6  HGB 11.6*  HCT 36.0*  MCV 89.8  PLT 789   Basic Metabolic Panel: Recent Labs  Lab 03/10/19 0754 03/10/19 1446 03/10/19 2034 03/11/19 0604  NA 138 138 137 139  K 5.2* 6.2* 6.1* 5.8*  CL 109 112* 112* 115*  CO2 17* 19* 16* 15*  GLUCOSE 108* 143* 246* 140*  BUN 88* 85* 85* 91*  CREATININE 10.96* 10.58* 10.60* 11.19*  CALCIUM 9.2 8.2* 7.8* 8.5*   GFR: CrCl cannot be calculated (Unknown ideal weight.). Liver Function Tests: Recent Labs  Lab 03/10/19 0754 03/11/19 0604  AST 10* 10*  ALT 16 12  ALKPHOS 78 60  BILITOT 0.6 0.3  PROT 7.5 6.4*  ALBUMIN 3.4* 2.9*   No results for input(s): LIPASE, AMYLASE in the last 168 hours. No results for input(s): AMMONIA in the last 168 hours. Coagulation Profile: No results for input(s): INR, PROTIME in the last 168 hours. Cardiac Enzymes: Recent Labs  Lab 03/10/19 1637  CKTOTAL 45*   BNP (last 3 results) No results for input(s): PROBNP in the last 8760 hours. HbA1C: Recent Labs    03/11/19 0604  HGBA1C 5.8*   CBG: Recent Labs  Lab 03/10/19 0753 03/10/19 1852 03/10/19 2144 03/11/19 0745 03/11/19 1022  GLUCAP 100* 152* 197* 139* 192*   Lipid Profile: No results for input(s): CHOL, HDL, LDLCALC, TRIG, CHOLHDL, LDLDIRECT in the last 72 hours. Thyroid Function Tests: Recent Labs    03/10/19 1602  TSH 3.147   Anemia Panel: No results for input(s): VITAMINB12, FOLATE, FERRITIN, TIBC, IRON, RETICCTPCT in the last 72 hours. Urine analysis:    Component Value Date/Time   COLORURINE YELLOW 03/10/2019 0933   APPEARANCEUR HAZY (A) 03/10/2019 0933   LABSPEC 1.010  03/10/2019 0933   PHURINE 5.0 03/10/2019 0933   GLUCOSEU NEGATIVE 03/10/2019 0933   HGBUR LARGE (A) 03/10/2019 0933   BILIRUBINUR NEGATIVE 03/10/2019 0933   KETONESUR NEGATIVE 03/10/2019 0933   PROTEINUR 100 (A) 03/10/2019 0933   NITRITE NEGATIVE 03/10/2019 0933   LEUKOCYTESUR NEGATIVE 03/10/2019 0933   Sepsis Labs: @LABRCNTIP (procalcitonin:4,lacticidven:4)  ) Recent Results (from the past 240 hour(s))  SARS CORONAVIRUS 2 (TAT 6-24 HRS) Nasopharyngeal Nasopharyngeal Swab     Status: None   Collection Time: 03/10/19  9:42 AM   Specimen: Nasopharyngeal Swab  Result Value Ref Range Status   SARS Coronavirus 2 NEGATIVE NEGATIVE Final    Comment: (NOTE) SARS-CoV-2 target nucleic acids are NOT DETECTED. The SARS-CoV-2 RNA is generally detectable in upper and lower respiratory specimens during the acute phase of infection. Negative results do not preclude SARS-CoV-2 infection, do not rule out co-infections with other pathogens, and should not be used as the sole basis for treatment or other patient management decisions. Negative results must  be combined with clinical observations, patient history, and epidemiological information. The expected result is Negative. Fact Sheet for Patients: SugarRoll.be Fact Sheet for Healthcare Providers: https://www.woods-mathews.com/ This test is not yet approved or cleared by the [place] FDA and  has been authorized for detection and/or diagnosis of SARS-CoV-2 by FDA under an Emergency Use Authorization (EUA). This EUA will remain  in effect (meaning this test can be used) for the duration of the COVID-19 declaration under Section 56 4(b)(1) of the Act, 21 U.S.C. section 360bbb-3(b)(1), unless the authorization is terminated or revoked sooner. Performed at [facility], [place] [address], [place], [place] [address]       Studies: MR BRAIN WO CONTRAST  Result Date: 03/10/2019 CLINICAL DATA:   Subacute neuro deficit.  Diarrhea and vomiting EXAM: MRI HEAD WITHOUT CONTRAST TECHNIQUE: Multiplanar, multiecho pulse sequences of the brain and surrounding structures were obtained without intravenous contrast. COMPARISON:  CT head 11/30/2004 FINDINGS: Brain: Ventricle size and cerebral volume normal. Negative for acute infarct. Scattered small white matter hyperintensities bilaterally. Mild hyperintensity in the pons right greater than left. Negative for hemorrhage or mass. No midline shift. Vascular: Normal arterial flow voids Skull and upper cervical spine: No focal skeletal lesion. Sinuses/Orbits: Mild mucosal edema paranasal sinuses and mastoid sinus bilaterally. Negative orbit Other: None IMPRESSION: No acute abnormality. Mild chronic microvascular ischemic change in the white matter. Electronically Signed   By: Franchot Gallo M.D.   On: 03/10/2019 14:04   US Renal  Result Date: 03/10/2019 CLINICAL DATA:  New onset renal failure. EXAM: RENAL / URINARY TRACT ULTRASOUND COMPLETE COMPARISON:  None. FINDINGS: Right Kidney: Renal measurements: 11.1 x 4.1 x 5.0 cm = volume: 120 mL. Increased parenchymal echogenicity. No mass, stone or hydronephrosis. Left Kidney: Renal measurements: 11.6 x 5.6 x 5.1 cm = volume: 172 mL. Increased parenchymal echogenicity. Medial midpole cyst measuring 1.7 x 1.3 x 1.4 cm. No other masses, no stones and no hydronephrosis. Bladder: Minimally distended.  Otherwise unremarkable. Other: None. IMPRESSION: 1. Bilateral increased renal parenchymal echogenicity consistent with medical renal disease. 2. No hydronephrosis.  No acute finding. 3. 1.7 cm left renal cyst. Electronically Signed   By: Lajean Manes M.D.   On: 03/10/2019 11:38    Scheduled Meds: . insulin aspart  0-9 Units Subcutaneous TID WC  . sodium bicarbonate  100 mEq Intravenous Once  . sodium zirconium cyclosilicate  10 g Oral TID    Continuous Infusions: . sodium chloride 125 mL/hr at 03/10/19 1627  .  methylPREDNISolone (SOLU-MEDROL) injection 500 mg (03/11/19 0551)     LOS: 0 days     Kayleen Memos, MD [facility] Pager [phone]  If 7PM-7AM, please contact night-coverage [email or web address] Password [facility] 03/11/2019, 10:34 AM

## 2019-03-11 NOTE — Progress Notes (Signed)
AMION page sent to Dr. Nevada Crane making her aware of patient continue increase in his BUN and creatine levels.

## 2019-03-12 ENCOUNTER — Inpatient Hospital Stay (HOSPITAL_COMMUNITY): Payer: Medicaid Other

## 2019-03-12 HISTORY — PX: IR FLUORO GUIDE CV LINE RIGHT: IMG2283

## 2019-03-12 HISTORY — PX: IR US GUIDE VASC ACCESS RIGHT: IMG2390

## 2019-03-12 LAB — PROTEIN ELECTROPHORESIS, SERUM
A/G Ratio: 0.9 (ref 0.7–1.7)
Albumin ELP: 2.9 g/dL (ref 2.9–4.4)
Alpha-1-Globulin: 0.3 g/dL (ref 0.0–0.4)
Alpha-2-Globulin: 1 g/dL (ref 0.4–1.0)
Beta Globulin: 0.9 g/dL (ref 0.7–1.3)
Gamma Globulin: 1 g/dL (ref 0.4–1.8)
Globulin, Total: 3.3 g/dL (ref 2.2–3.9)
Total Protein ELP: 6.2 g/dL (ref 6.0–8.5)

## 2019-03-12 LAB — PROTIME-INR
INR: 1.1 (ref 0.8–1.2)
Prothrombin Time: 14 seconds (ref 11.4–15.2)

## 2019-03-12 LAB — CBC
HCT: 28.3 % — ABNORMAL LOW (ref 39.0–52.0)
Hemoglobin: 9.2 g/dL — ABNORMAL LOW (ref 13.0–17.0)
MCH: 29.3 pg (ref 26.0–34.0)
MCHC: 32.5 g/dL (ref 30.0–36.0)
MCV: 90.1 fL (ref 80.0–100.0)
Platelets: 221 10*3/uL (ref 150–400)
RBC: 3.14 MIL/uL — ABNORMAL LOW (ref 4.22–5.81)
RDW: 13.2 % (ref 11.5–15.5)
WBC: 16.8 10*3/uL — ABNORMAL HIGH (ref 4.0–10.5)
nRBC: 0 % (ref 0.0–0.2)

## 2019-03-12 LAB — GLUCOSE, CAPILLARY
Glucose-Capillary: 109 mg/dL — ABNORMAL HIGH (ref 70–99)
Glucose-Capillary: 107 mg/dL — ABNORMAL HIGH (ref 70–99)
Glucose-Capillary: 100 mg/dL — ABNORMAL HIGH (ref 70–99)
Glucose-Capillary: 239 mg/dL — ABNORMAL HIGH (ref 70–99)

## 2019-03-12 LAB — BASIC METABOLIC PANEL
Anion gap: 13 (ref 5–15)
BUN: 98 mg/dL — ABNORMAL HIGH (ref 6–20)
CO2: 17 mmol/L — ABNORMAL LOW (ref 22–32)
Calcium: 8.5 mg/dL — ABNORMAL LOW (ref 8.9–10.3)
Chloride: 111 mmol/L (ref 98–111)
Creatinine, Ser: 10.64 mg/dL — ABNORMAL HIGH (ref 0.61–1.24)
GFR calc Af Amer: 5 mL/min — ABNORMAL LOW (ref >60)
GFR calc non Af Amer: 5 mL/min — ABNORMAL LOW (ref >60)
Glucose, Bld: 127 mg/dL — ABNORMAL HIGH (ref 70–99)
Potassium: 5.6 mmol/L — ABNORMAL HIGH (ref 3.5–5.1)
Sodium: 141 mmol/L (ref 135–145)

## 2019-03-12 IMAGING — US IR FLUORO GUIDE CV LINE*R*
2 series · 5 of 5 positions shown · non-contrast
Comparison: none

INDICATION: 58-year-old with renal failure and starting hemodialysis.
Hemodialysis catheter is needed.

[Series 1: (id) · 2 of 2 slices shown]
[im 1/2]
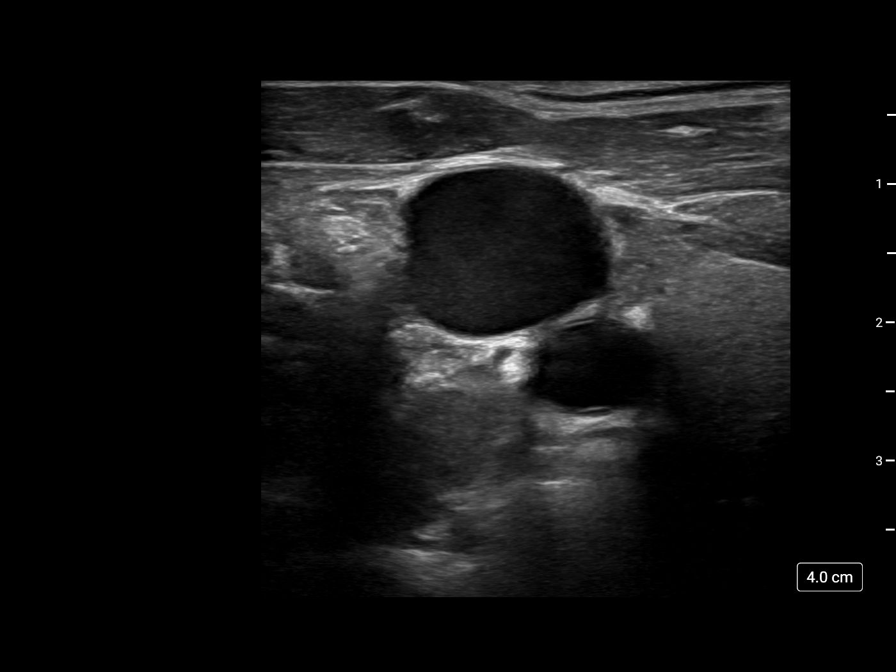
[im 2/2]
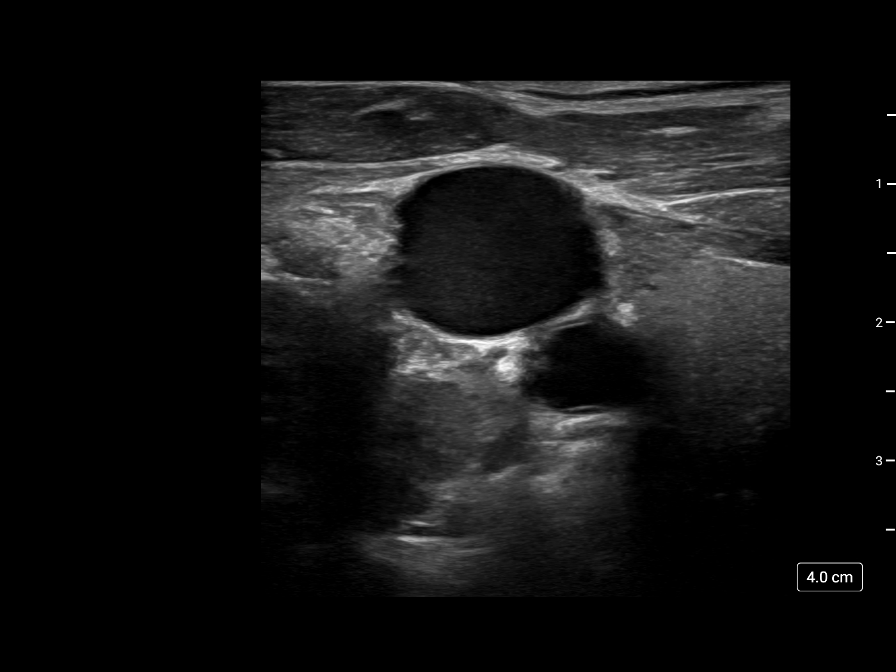

[Series 300: spine · 3 of 3 slices shown]
[im 1/3]
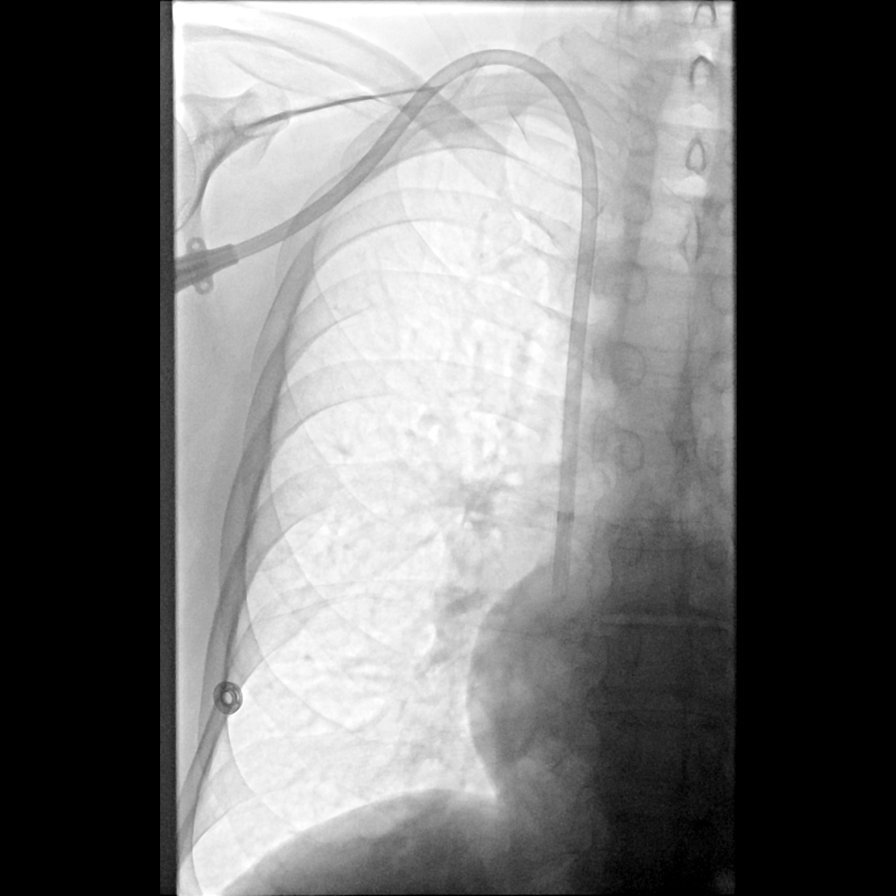
[im 2/3]
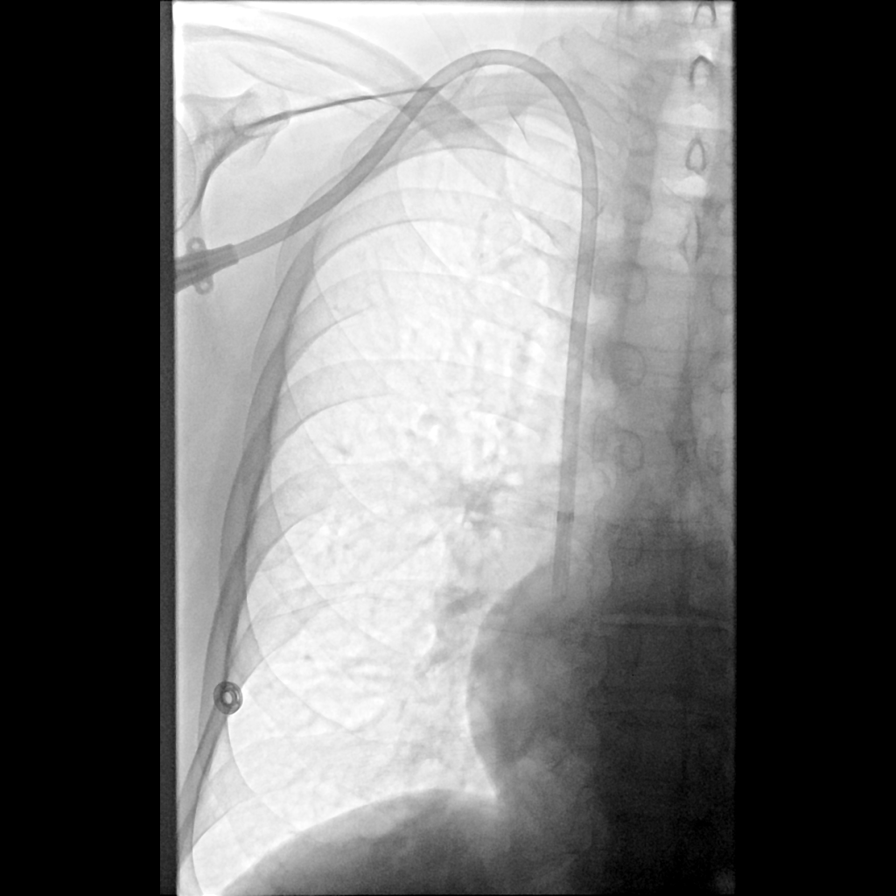
[im 3/3]
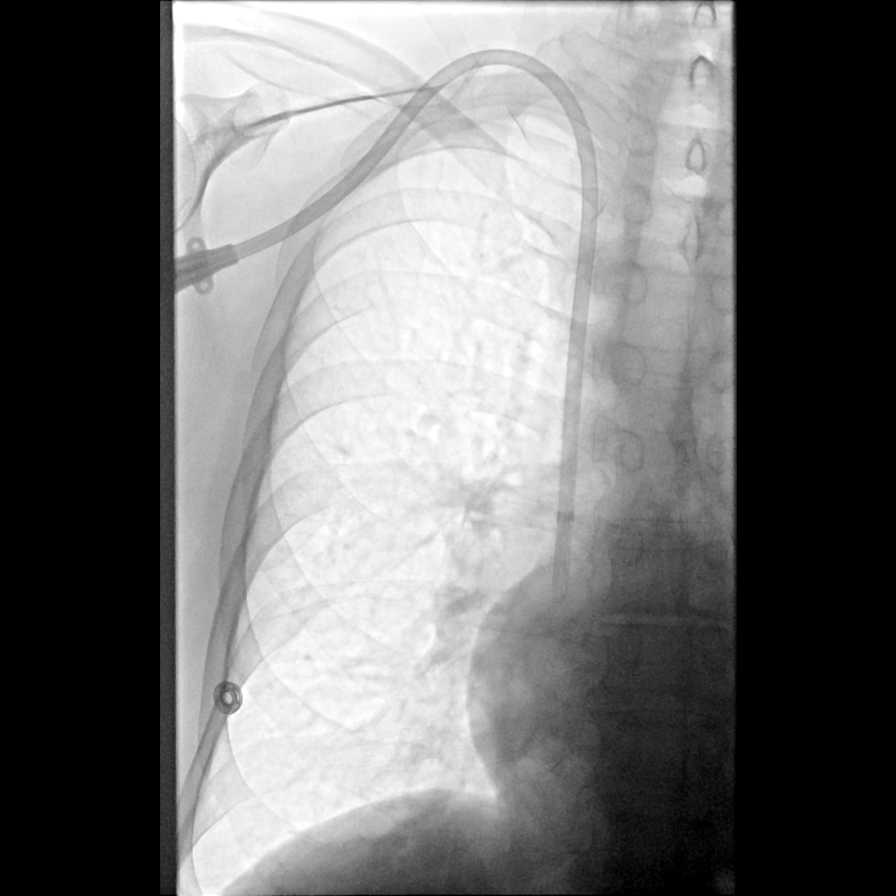

[5 of 5 positions shown; findings below may reference images not displayed]

EXAM:
FLUOROSCOPIC AND ULTRASOUND GUIDED PLACEMENT OF A TUNNELED DIALYSIS
CATHETER

MEDICATIONS:
Ancef 2 g; The antibiotic was administered within an appropriate
time interval prior to skin puncture.

ANESTHESIA/SEDATION:
Versed 1.0 mg IV; Fentanyl 50 mcg IV;

Moderate Sedation Time:  20 minutes

The patient was continuously monitored during the procedure by the
interventional radiology nurse under my direct supervision.

FLUOROSCOPY TIME:  Fluoroscopy Time: 48 seconds, 11.1 mGy

COMPLICATIONS:
None immediate.

PROCEDURE:
The procedure was explained to the patient. The risks and benefits
of the procedure were discussed and the patient's questions were
addressed. Informed consent was obtained from the patient. The
patient was placed supine on the interventional table. Ultrasound
confirmed a patent right internal jugular vein. Ultrasound image was
obtained for documentation. The right neck and chest was prepped and
draped in a sterile fashion. The right neck was anesthetized with 1%
lidocaine. Maximal barrier sterile technique was utilized including
caps, mask, sterile gowns, sterile gloves, sterile drape, hand
hygiene and skin antiseptic. A small incision was made with #11
blade scalpel. A 21 gauge needle directed into the right internal
jugular vein vein with ultrasound guidance. A micropuncture dilator
set was placed. A 23 cm tip to cuff Palindrome catheter was
selected. The skin below the right clavicle was anesthetized and a
small incision was made with an #11 blade scalpel. A subcutaneous
tunnel was formed to the vein dermatotomy site. The catheter was
brought through the tunnel. The vein dermatotomy site was dilated to
accommodate a peel-away sheath. The catheter was placed through the
peel-away sheath and directed into the central venous structures.
The tip of the catheter was placed at the SVC and right atrium
junction with fluoroscopy. Fluoroscopic images were obtained for
documentation. Both lumens were found to aspirate and flush well.
The proper amount of heparin was flushed in both lumens. The vein
dermatotomy site was closed using a single layer of absorbable
suture and Dermabond. Gel-Foam placed in the subcutaneous tract. The
catheter was secured to the skin using Prolene suture.
IMPRESSION: Successful placement of a right jugular tunneled dialysis catheter
using ultrasound and fluoroscopic guidance.

## 2019-03-12 MED ORDER — CHLORHEXIDINE GLUCONATE CLOTH 2 % EX PADS
6.0000 | MEDICATED_PAD | Freq: Every day | CUTANEOUS | Status: DC
  Administered 2019-03-13 – 2019-03-29 (×9): 6 via TOPICAL

## 2019-03-12 MED ORDER — SULFAMETHOXAZOLE-TRIMETHOPRIM 400-80 MG PO TABS
1.0000 | ORAL_TABLET | Freq: Every day | ORAL | Status: DC
  Administered 2019-03-12 – 2019-04-04 (×24): 1 via ORAL
  Filled 2019-03-12 (×24): qty 1

## 2019-03-12 MED ORDER — MIDAZOLAM HCL 2 MG/2ML IJ SOLN
INTRAMUSCULAR | Status: AC
  Filled 2019-03-12: qty 2

## 2019-03-12 MED ORDER — LIDOCAINE HCL 1 % IJ SOLN
INTRAMUSCULAR | Status: AC
  Filled 2019-03-12: qty 20

## 2019-03-12 MED ORDER — HEPARIN SODIUM (PORCINE) 1000 UNIT/ML IJ SOLN
INTRAMUSCULAR | Status: AC
  Filled 2019-03-12: qty 1

## 2019-03-12 MED ORDER — HEPARIN SODIUM (PORCINE) 1000 UNIT/ML IJ SOLN
INTRAMUSCULAR | Status: AC
  Administered 2019-03-12: 3800 [IU] via INTRAVENOUS
  Filled 2019-03-12: qty 4

## 2019-03-12 MED ORDER — CEFAZOLIN SODIUM-DEXTROSE 2-4 GM/100ML-% IV SOLN
INTRAVENOUS | Status: AC
  Filled 2019-03-12: qty 100

## 2019-03-12 MED ORDER — FENTANYL CITRATE (PF) 100 MCG/2ML IJ SOLN
INTRAMUSCULAR | Status: AC | PRN
  Administered 2019-03-12 (×2): 25 ug via INTRAVENOUS

## 2019-03-12 MED ORDER — PREDNISONE 50 MG PO TABS
60.0000 mg | ORAL_TABLET | Freq: Every day | ORAL | Status: DC
  Administered 2019-03-13 – 2019-04-04 (×23): 60 mg via ORAL
  Filled 2019-03-12 (×23): qty 1

## 2019-03-12 MED ORDER — MIDAZOLAM HCL 2 MG/2ML IJ SOLN
INTRAMUSCULAR | Status: AC | PRN
  Administered 2019-03-12 (×2): 0.5 mg via INTRAVENOUS

## 2019-03-12 MED ORDER — GELATIN ABSORBABLE 12-7 MM EX MISC
CUTANEOUS | Status: AC | PRN
  Administered 2019-03-12: 1 via TOPICAL

## 2019-03-12 MED ORDER — SODIUM ZIRCONIUM CYCLOSILICATE 10 G PO PACK
10.0000 g | PACK | ORAL | Status: AC
  Administered 2019-03-12: 10 g via ORAL
  Filled 2019-03-12: qty 1

## 2019-03-12 MED ORDER — FENTANYL CITRATE (PF) 100 MCG/2ML IJ SOLN
INTRAMUSCULAR | Status: AC
  Filled 2019-03-12: qty 2

## 2019-03-12 MED ORDER — LIDOCAINE HCL (PF) 1 % IJ SOLN
INTRAMUSCULAR | Status: AC | PRN
  Administered 2019-03-12: 10 mL

## 2019-03-12 MED ORDER — HEPARIN SODIUM (PORCINE) 1000 UNIT/ML IJ SOLN
INTRAMUSCULAR | Status: AC | PRN
  Administered 2019-03-12: 3800 [IU] via INTRAVENOUS

## 2019-03-12 MED ORDER — GELATIN ABSORBABLE 12-7 MM EX MISC
CUTANEOUS | Status: AC
  Filled 2019-03-12: qty 1

## 2019-03-12 MED ORDER — CYCLOPHOSPHAMIDE 50 MG PO CAPS: 100.0000 mg | ORAL_CAPSULE | Freq: Every day | ORAL | Status: DC

## 2019-03-12 MED ORDER — CYCLOPHOSPHAMIDE 25 MG PO CAPS
125.0000 mg | ORAL_CAPSULE | Freq: Every day | ORAL | Status: DC
  Administered 2019-03-12 – 2019-04-04 (×24): 125 mg via ORAL
  Filled 2019-03-12 (×25): qty 5

## 2019-03-12 NOTE — Plan of Care (Signed)
  Problem: Education: Goal: Knowledge of General Education information will improve Description Including pain rating scale, medication(s)/side effects and non-pharmacologic comfort measures Outcome: Progressing   

## 2019-03-12 NOTE — Procedures (Signed)
Interventional Radiology Procedure:   Indications: Renal failure  Procedure: Tunneled dialysis catheter placement  Findings: Right jugular dialysis cathter, tip at SVC/RA junction  Complications: None     EBL: less than 20 ml  Plan: Catheter is ready to use.    Gabriel Harris R. Anselm Pancoast, MD  Pager: [phone]

## 2019-03-12 NOTE — Progress Notes (Signed)
Spoke with Dr Jonnie Finner regarding renal biopsy and hd catheter today. He states that he's spoken with Dr Moshe Cipro and they would like to have tunneled HD catheter placed today and renal biopsy tomorrow. Will let all participating parties know.

## 2019-03-12 NOTE — Plan of Care (Signed)
  Problem: Activity: Goal: Risk for activity intolerance will decrease Outcome: Progressing   

## 2019-03-12 NOTE — Progress Notes (Signed)
PROGRESS NOTE    Luverne Zerkle  GBE:[id number] DOB: [birth year] DOA: 03/10/2019 PCP: Patient, No Pcp Per   Brief Narrative: Per HPI: 59 y.o.malewithno significant past medical history except for tobacco use disorder who presented to the [facility] ED with complaints of 2 weeks of worsening dizziness, nausea, tinnitus, diplopia, spinning sensation, associated with 3 days of diarrhea and at least 5 episodes of emesis. In the ED, MRI negative for any intracranial findings but was found to have significantly elevated creatinine greater than 10.  Was started on IV fluid and nephrology was consulted.  Work-up showed concern for possible GN.  Unfortunately creatinine continues to trend up at >11 with oliguria.  Nephrology recommended transfer to [facility] for possible hemodialysis.  3/10 IR has been consulted for placement of line for HD  Subjective:  No SOB,Fever. Was short of breath last night and was placed on [place]. Coughing-this is from his cigaretter Still some dizzy but not "bad". Got up and shaved this am Sugar 109, fever.  Labs pending. Wbc went up and k still up at 5.6. 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  Sediment suggestive of glomerulonephritis, renal ultrasound without hydronephrosis and creatinine did not improve with hydration.  Per nephrology plan is for IR HD catheter placement today and dialysis renal biopsy in the morning. Ordered labs this am.  Hyperkalemia 2/2 #1.lokelma x1 this am, for hd.  Neurology. Hypertension blood pressure fairly stable.. Normocytic anemia: Stable.  In the setting of renal failure.  Monitor.  Sinus bradycardia-stable.  Tobacco use: Cessation advised.  Vertigo/Diplopia: Unclear etiology MRI brain no acute finding.  Continue PT OT fall precaution, monitor orthostatics.  Reports  his symptoms are much better he was able to ambulate this morning in the room  DVT prophylaxis:SCD Code Status:FULL Family Communication: plan of care discussed with patient at bedside. Disposition Plan: Patient is from:HOME Anticipated Disposition: to HOME Barriers to discharge or conditions that needs to be met prior to discharge: Remains inpatient for initiating dialysis and further work-up with renal biopsy. Home once cleared by nephrology.    Consultants: NEPHRO Procedures:see note Microbiology:see note  Medications: Scheduled Meds: . [START ON 03/13/2019] Chlorhexidine Gluconate Cloth  6 each Topical Q0600  . cyclophosphamide  125 mg Oral Daily  . insulin aspart  0-9 Units Subcutaneous TID WC  . [START ON 03/13/2019] predniSONE  60 mg Oral Q breakfast  . sulfamethoxazole-trimethoprim  1 tablet Oral Daily   Continuous Infusions: . sodium chloride 75 mL/hr at 03/12/19 0637  .  ceFAZolin (ANCEF) IV    . methylPREDNISolone (SOLU-MEDROL) injection 500 mg (03/12/19 7588)    Antimicrobials: Anti-infectives (From admission, onward)   Start     Dose/Rate Route Frequency Ordered Stop   03/12/19 1300  sulfamethoxazole-trimethoprim (BACTRIM) 400-80 MG per tablet 1 tablet     1 tablet Oral Daily 03/12/19 1259     03/12/19 0800  ceFAZolin (ANCEF) IVPB 2g/100 mL premix     2 g 200 mL/hr over 30 Minutes Intravenous  Once 03/11/19 1615         Objective: Vitals: Today's Vitals   03/11/19 2033 03/11/19 2300 03/12/19 0441 03/12/19 0938  BP: 124/69  109/62 129/76  Pulse: (!) 57  (!) 48 (!) 50  Resp: 18  18 18   Temp: 98 F (36.7 C)  97.7 F (36.5  C) 97.9 F (36.6 C)  TempSrc: Oral  Oral Oral  SpO2: 97%  95% 100%  Weight:      PainSc:  0-No pain  0-No pain    Intake/Output Summary (Last 24 hours) at 03/12/2019 1417 Last data filed at 03/12/2019 1300 Gross per 24 hour  Intake 3965.54 ml  Output 705 ml  Net 3260.54 ml   Filed Weights   03/10/19 1600 03/11/19 0456  Weight:  73.8 kg 69.1 kg   Weight change:    Intake/Output from previous day: 03/09 0701 - 03/10 0700 In: 4145.5 [P.O.:480; I.V.:3557.5; IV Piggyback:108] Out: 605 [Urine:605] Intake/Output this shift: Total I/O In: 60 [P.O.:60] Out: 200 [Urine:200]  Examination:  General exam: AAOx3 ,NAD, weak appearing. HEENT:Oral mucosa moist, Ear/Nose WNL grossly,dentition normal. Respiratory system: bilaterally  Diminished at base,no wheezing or crackles,no use of accessory muscle, non tender. Cardiovascular system: S1 & S2 +, regular, No JVD. Gastrointestinal system: Abdomen soft, NT,ND, BS+. Nervous System:Alert, awake, moving extremities and grossly nonfocal Extremities: No edema, distal peripheral pulses palpable.  Skin: No rashes,no icterus. MSK: Normal muscle bulk,tone, power  Data Reviewed: I have personally reviewed following labs and imaging studies CBC: Recent Labs  Lab 03/10/19 0755 03/11/19 0604 03/12/19 0803  WBC 9.6 10.2 16.8*  HGB 11.6* 9.2* 9.2*  HCT 36.0* 29.1* 28.3*  MCV 89.8 92.1 90.1  PLT 244 203 086   Basic Metabolic Panel: Recent Labs  Lab 03/10/19 0754 03/10/19 1446 03/10/19 2034 03/11/19 0604 03/12/19 0803  NA 138 138 137 139 141  K 5.2* 6.2* 6.1* 5.8* 5.6*  CL 109 112* 112* 115* 111  CO2 17* 19* 16* 15* 17*  GLUCOSE 108* 143* 246* 140* 127*  BUN 88* 85* 85* 91* 98*  CREATININE 10.96* 10.58* 10.60* 11.19* 10.64*  CALCIUM 9.2 8.2* 7.8* 8.5* 8.5*   GFR: CrCl cannot be calculated (Unknown ideal weight.). Liver Function Tests: Recent Labs  Lab 03/10/19 0754 03/11/19 0604  AST 10* 10*  ALT 16 12  ALKPHOS 78 60  BILITOT 0.6 0.3  PROT 7.5 6.4*  ALBUMIN 3.4* 2.9*   No results for input(s): LIPASE, AMYLASE in the last 168 hours. No results for input(s): AMMONIA in the last 168 hours. Coagulation Profile: Recent Labs  Lab 03/12/19 0838  INR 1.1   Cardiac Enzymes: Recent Labs  Lab 03/10/19 1637  CKTOTAL 45*   BNP (last 3 results) No results  for input(s): PROBNP in the last 8760 hours. HbA1C: Recent Labs    03/11/19 0604  HGBA1C 5.8*   CBG: Recent Labs  Lab 03/11/19 1657 03/11/19 1903 03/11/19 2038 03/12/19 0722 03/12/19 1116  GLUCAP 135* 171* 132* 109* 107*   Lipid Profile: No results for input(s): CHOL, HDL, LDLCALC, TRIG, CHOLHDL, LDLDIRECT in the last 72 hours. Thyroid Function Tests: Recent Labs    03/10/19 1602  TSH 3.147   Anemia Panel: No results for input(s): VITAMINB12, FOLATE, FERRITIN, TIBC, IRON, RETICCTPCT in the last 72 hours. Sepsis Labs: No results for input(s): PROCALCITON, LATICACIDVEN in the last 168 hours.  Recent Results (from the past 240 hour(s))  SARS CORONAVIRUS 2 (TAT 6-24 HRS) Nasopharyngeal Nasopharyngeal Swab     Status: None   Collection Time: 03/10/19  9:42 AM   Specimen: Nasopharyngeal Swab  Result Value Ref Range Status   SARS Coronavirus 2 NEGATIVE NEGATIVE Final    Comment: (NOTE) SARS-CoV-2 target nucleic acids are NOT DETECTED. The SARS-CoV-2 RNA is generally detectable in upper and lower respiratory specimens during the acute phase of  infection. Negative results do not preclude SARS-CoV-2 infection, do not rule out co-infections with other pathogens, and should not be used as the sole basis for treatment or other patient management decisions. Negative results must be combined with clinical observations, patient history, and epidemiological information. The expected result is Negative. Fact Sheet for Patients: SugarRoll.be Fact Sheet for Healthcare Providers: https://www.woods-mathews.com/ This test is not yet approved or cleared by the [place] FDA and  has been authorized for detection and/or diagnosis of SARS-CoV-2 by FDA under an Emergency Use Authorization (EUA). This EUA will remain  in effect (meaning this test can be used) for the duration of the COVID-19 declaration under Section 56 4(b)(1) of the Act, 21  U.S.C. section 360bbb-3(b)(1), unless the authorization is terminated or revoked sooner. Performed at [facility], [place] [address] [place], [place] [address]       Radiology Studies: No results found.   LOS: 1 day   Time spent: More than 50% of that time was spent in counseling and/or coordination of care.  Antonieta Pert, MD [facility]  03/12/2019, 2:17 PM

## 2019-03-12 NOTE — Progress Notes (Signed)
Subjective:  Pt transferred over here for renal biopsy and TDC- vital signs pretty good-  605 of UOP.  Nervous and upset about future.  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 yo male with a negative PMhx except smoking who was admitted on 03/10/2019 with c/o nausea, dizziness labs found renal failure with a creatinine of 10.   Assessment/Plan: 1. Renal-  No PMhx-  crt of 10- no old data.  U/A with 100 of prot and >50 RBC so concern for GN.  Renal function no better over 2 days- mild hyperkalemia and likely uremic sxms.  Transferred to [facility] yest in preparation for [facility] to start dialysis and to get a renal biopsy for diagnostic purposes. Serologies so far-  HIV neg, ASO neg, GBM neg, ANA pos 1:80. Anto MPO markedly positive 94.3, PR3 neg.  Was started on solumedrol 1 gram per day on 3/8- tolerating well.  Given the positive anti MPO- very suspicious for vasculitis-  will go ahead and start treatment with oral cytoxan 125 mg per day in addition to the steroids.  I talked this over with the patient and his sister-  Risk of immunosuppressant medications- making more likely for infections, and possibility of decreased WBC and hair loss discussed with patient-  Will also start bactrim for prophylaxis. Given BUN of 98 and K of 5.6 will need HD today as well.  Will plan for short treatment after [facility] placement.  I also d/w pt the fact  that his kidney function was poor at presentation-  Will hope that we will see some response to meds but we may not.  The biopsy can give [place] more information in terms of renal recovery  2. HTN/vol-   BP not bad, volume status OK-  Keep even with HD-  No BP meds 3. Anemia-  Check iron stores and consider ESA 4. Secondary hyperparathyroidism- check pth at some point   [place]: Basic Metabolic Panel: Recent Labs  Lab 03/10/19 2034 03/11/19 0604 03/12/19 0803  NA 137 139 141  K 6.1* 5.8* 5.6*  CL 112* 115* 111  CO2 16* 15* 17*  GLUCOSE 246* 140* 127*  BUN 85* 91* 98*  CREATININE 10.60* 11.19* 10.64*  CALCIUM 7.8* 8.5* 8.5*   Liver Function Tests: Recent Labs  Lab 03/10/19 0754 03/11/19 0604  AST 10* 10*  ALT 16 12  ALKPHOS 78 60  BILITOT 0.6 0.3  PROT 7.5 6.4*  ALBUMIN 3.4* 2.9*   No results for input(s): LIPASE, AMYLASE in the last 168 hours. No results for input(s): AMMONIA in the last 168 hours. CBC: Recent Labs  Lab 03/10/19 0755 03/11/19 0604 03/12/19 0803  WBC 9.6 10.2 16.8*  HGB 11.6* 9.2* 9.2*  HCT 36.0* 29.1* 28.3*  MCV 89.8 92.1 90.1  PLT 244 203 221   Cardiac Enzymes: Recent Labs  Lab 03/10/19 1637  CKTOTAL 45*   CBG: Recent Labs  Lab 03/11/19 1657 03/11/19 1903 03/11/19 2038 03/12/19 0722 03/12/19 1116  GLUCAP 135* 171* 132* 109* 107*    Iron Studies: No results for input(s): IRON, TIBC, TRANSFERRIN, FERRITIN in the last 72 hours. Studies/Results: MR BRAIN WO CONTRAST  Result Date: 03/10/2019 CLINICAL DATA:  Subacute neuro deficit.  Diarrhea and vomiting EXAM: MRI HEAD WITHOUT CONTRAST TECHNIQUE: Multiplanar, multiecho pulse sequences of the brain and surrounding structures were obtained without intravenous contrast. COMPARISON:  CT head 11/30/2004 FINDINGS: Brain: Ventricle size and cerebral volume normal. Negative for acute infarct. Scattered small white matter hyperintensities bilaterally. Mild hyperintensity in  the pons right greater than left. Negative for hemorrhage or mass. No midline shift. Vascular: Normal arterial flow voids Skull and upper cervical spine: No focal skeletal lesion. Sinuses/Orbits: Mild mucosal edema paranasal sinuses and mastoid sinus bilaterally. Negative orbit Other: None IMPRESSION: No acute abnormality. Mild chronic microvascular ischemic change in the white matter. Electronically Signed   By: Franchot Gallo M.D.   On: 03/10/2019 14:04   Medications: Infusions: . sodium chloride 75 mL/hr at 03/12/19 0637  .  ceFAZolin (ANCEF) IV    . methylPREDNISolone (SOLU-MEDROL) injection 500 mg (03/12/19 1517)    Scheduled Medications: . insulin aspart  0-9 Units Subcutaneous TID WC    have reviewed scheduled and prn medications.  Physical Exam: General: NAD, alert Heart: some low HR Lungs: mostly clear Abdomen: soft, non tender Extremities: no edema    03/12/2019,11:44 AM  LOS: 1 day

## 2019-03-13 ENCOUNTER — Inpatient Hospital Stay (HOSPITAL_COMMUNITY): Payer: Medicaid Other

## 2019-03-13 LAB — CBC
HCT: 27.5 % — ABNORMAL LOW (ref 39.0–52.0)
Hemoglobin: 9 g/dL — ABNORMAL LOW (ref 13.0–17.0)
MCH: 28.9 pg (ref 26.0–34.0)
MCHC: 32.7 g/dL (ref 30.0–36.0)
MCV: 88.4 fL (ref 80.0–100.0)
Platelets: 187 10*3/uL (ref 150–400)
RBC: 3.11 MIL/uL — ABNORMAL LOW (ref 4.22–5.81)
RDW: 13.2 % (ref 11.5–15.5)
WBC: 14.1 10*3/uL — ABNORMAL HIGH (ref 4.0–10.5)
nRBC: 0 % (ref 0.0–0.2)

## 2019-03-13 LAB — HEPATITIS B CORE ANTIBODY, TOTAL: Hep B Core Total Ab: NONREACTIVE

## 2019-03-13 LAB — BASIC METABOLIC PANEL
Anion gap: 12 (ref 5–15)
BUN: 83 mg/dL — ABNORMAL HIGH (ref 6–20)
CO2: 20 mmol/L — ABNORMAL LOW (ref 22–32)
Calcium: 7.4 mg/dL — ABNORMAL LOW (ref 8.9–10.3)
Chloride: 108 mmol/L (ref 98–111)
Creatinine, Ser: 7.79 mg/dL — ABNORMAL HIGH (ref 0.61–1.24)
GFR calc Af Amer: 8 mL/min — ABNORMAL LOW (ref >60)
GFR calc non Af Amer: 7 mL/min — ABNORMAL LOW (ref >60)
Glucose, Bld: 151 mg/dL — ABNORMAL HIGH (ref 70–99)
Potassium: 4.6 mmol/L (ref 3.5–5.1)
Sodium: 140 mmol/L (ref 135–145)

## 2019-03-13 LAB — GLUCOSE, CAPILLARY
Glucose-Capillary: 135 mg/dL — ABNORMAL HIGH (ref 70–99)
Glucose-Capillary: 140 mg/dL — ABNORMAL HIGH (ref 70–99)
Glucose-Capillary: 157 mg/dL — ABNORMAL HIGH (ref 70–99)
Glucose-Capillary: 121 mg/dL — ABNORMAL HIGH (ref 70–99)

## 2019-03-13 LAB — HEPATITIS B SURFACE ANTIBODY,QUALITATIVE: Hep B S Ab: NONREACTIVE

## 2019-03-13 LAB — HEPATITIS B SURFACE ANTIGEN: Hepatitis B Surface Ag: NONREACTIVE

## 2019-03-13 IMAGING — US US BIOPSY
1 series · 13 of 15 positions shown · non-contrast
Comparison: Renal ultrasound-[DATE]

INDICATION: Renal failure of uncertain etiology. Please perform
ultrasound-guided random renal biopsy for tissue diagnostic
purposes.

EXAM:
ULTRASOUND GUIDED RENAL BIOPSY

[Series 1: us biopsy (kidney) · 13 of 15 slices shown]
[im 1/15]
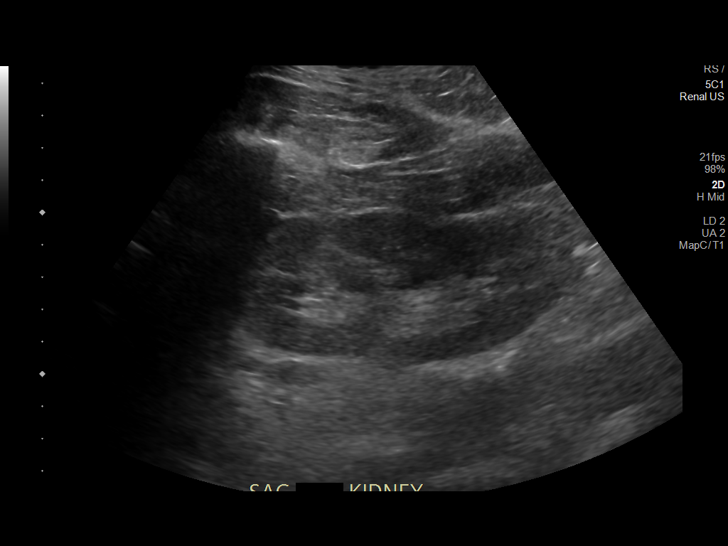
[im 2/15]
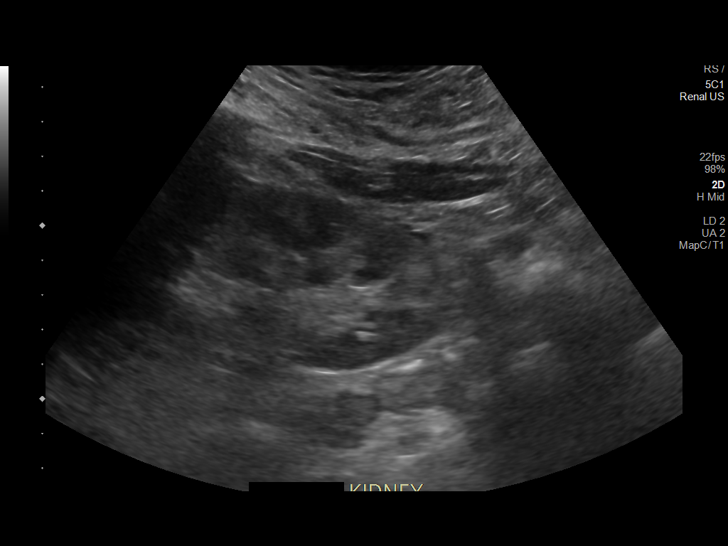
[im 3/15]
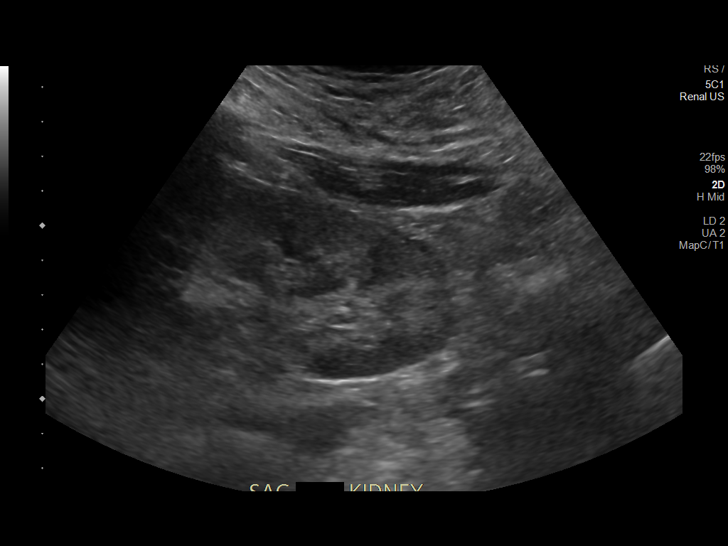
[im 5/15]
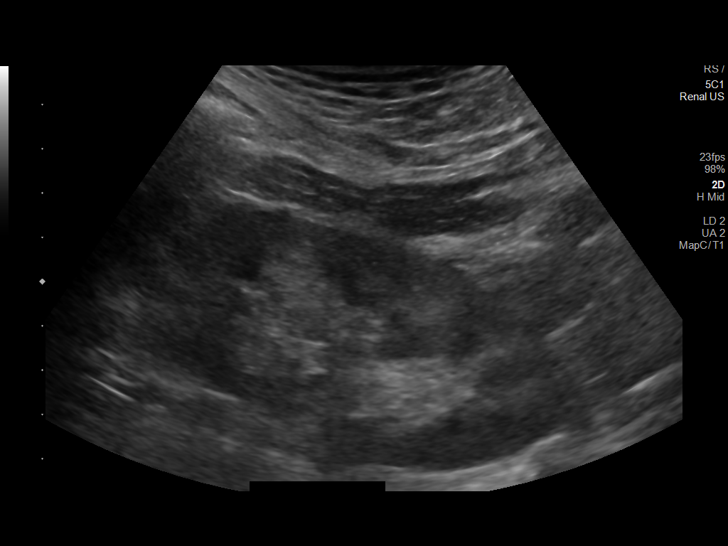
[im 6/15]
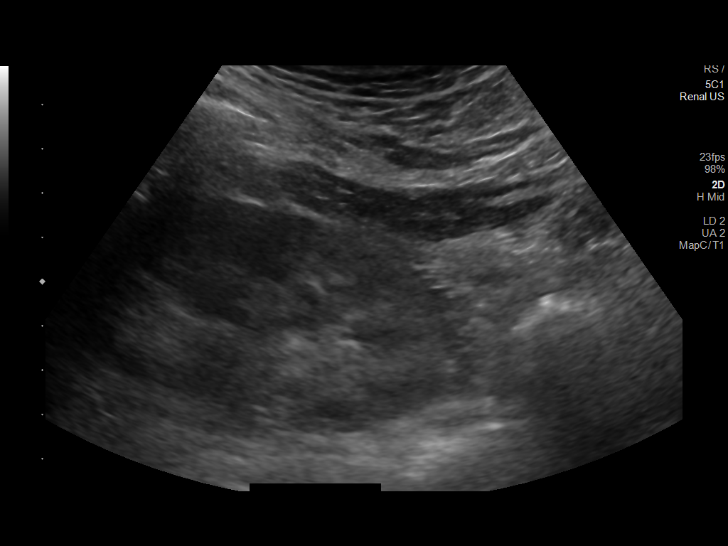
[im 7/15]
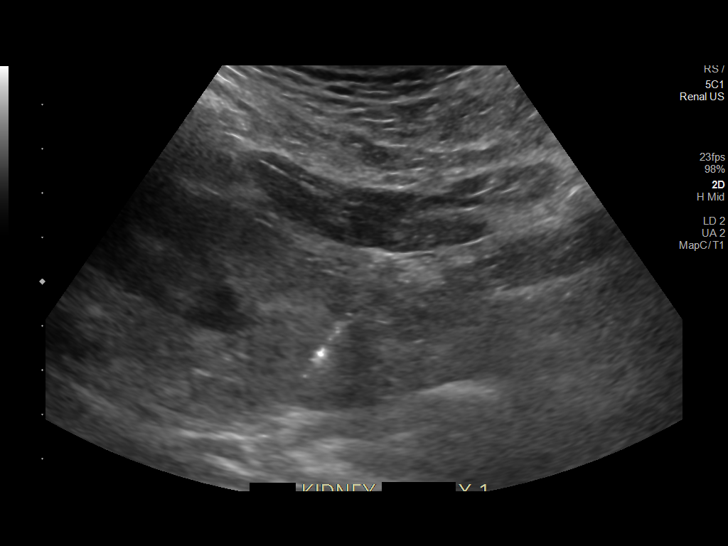
[im 8/15]
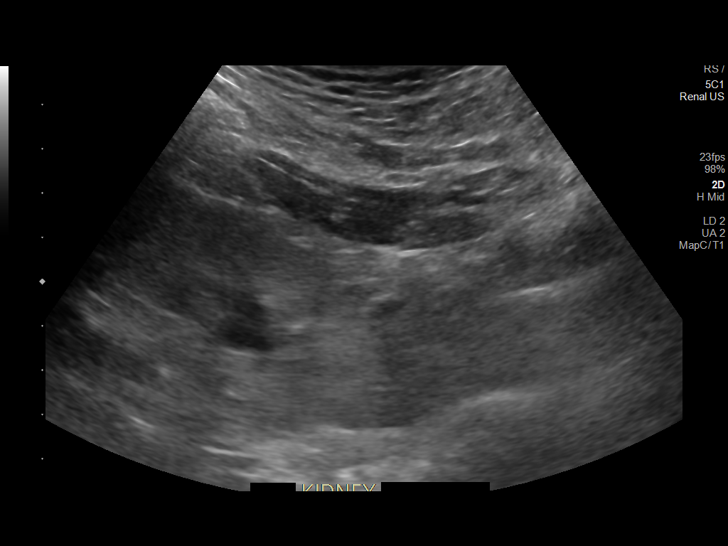
[im 9/15]
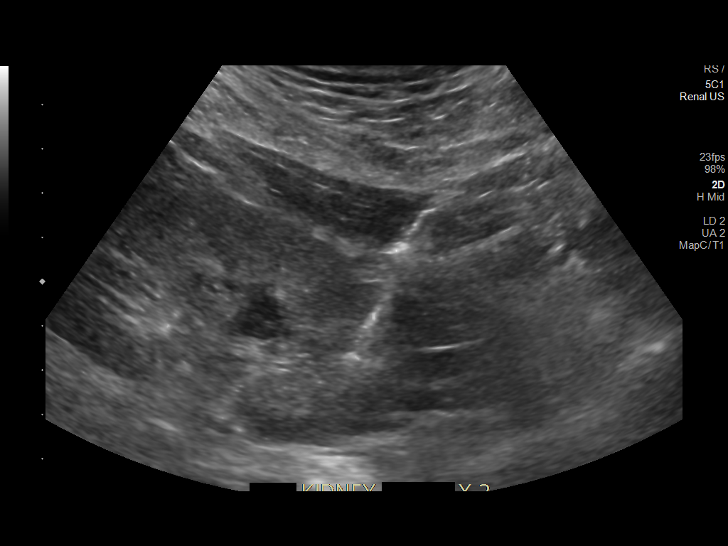
[im 10/15]
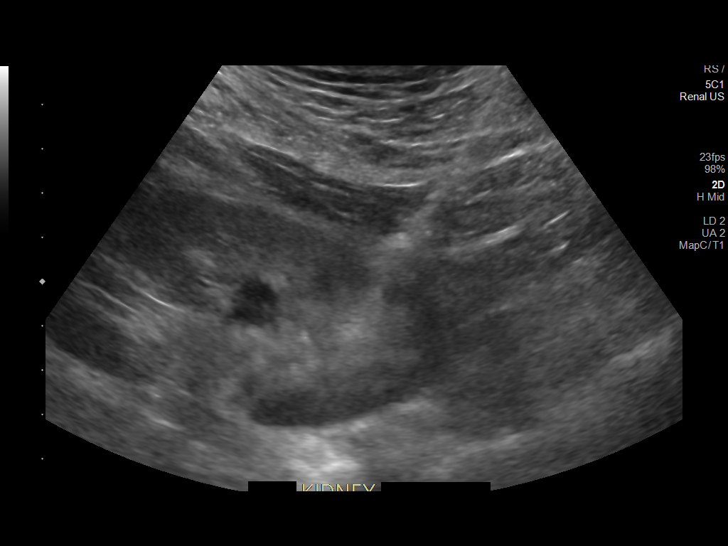
[im 11/15]
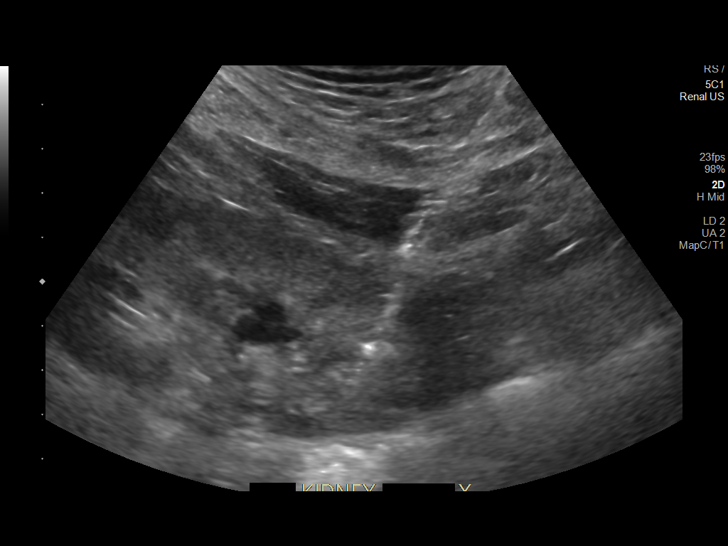
[im 13/15]
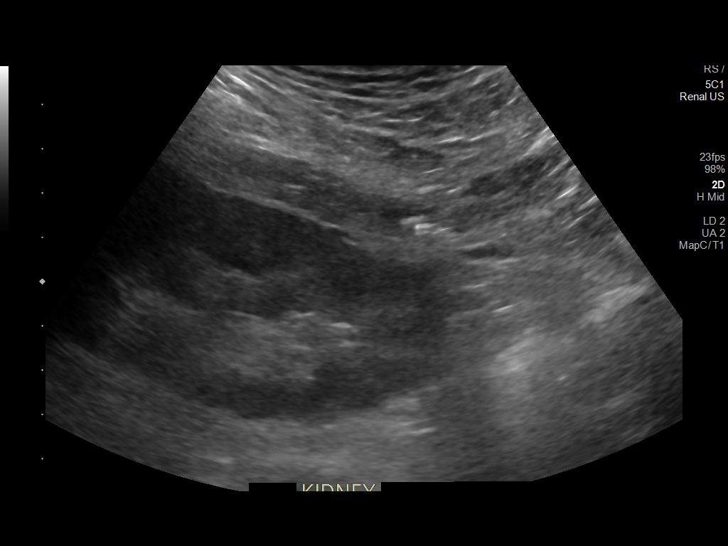
[im 14/15]
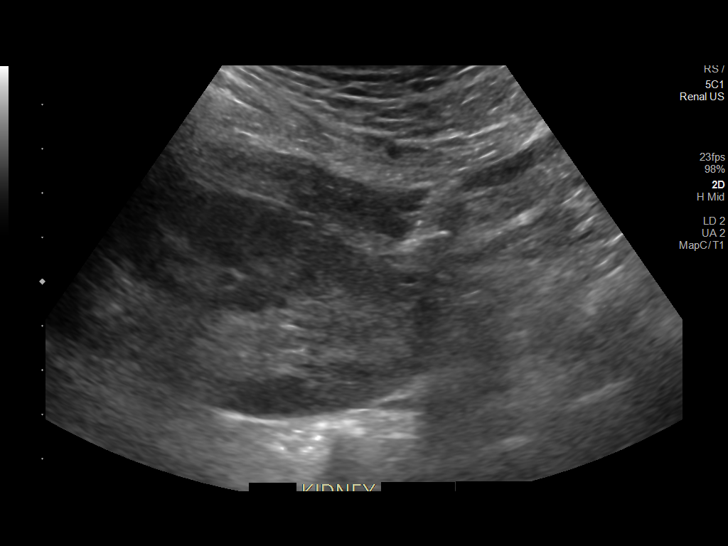
[im 15/15]
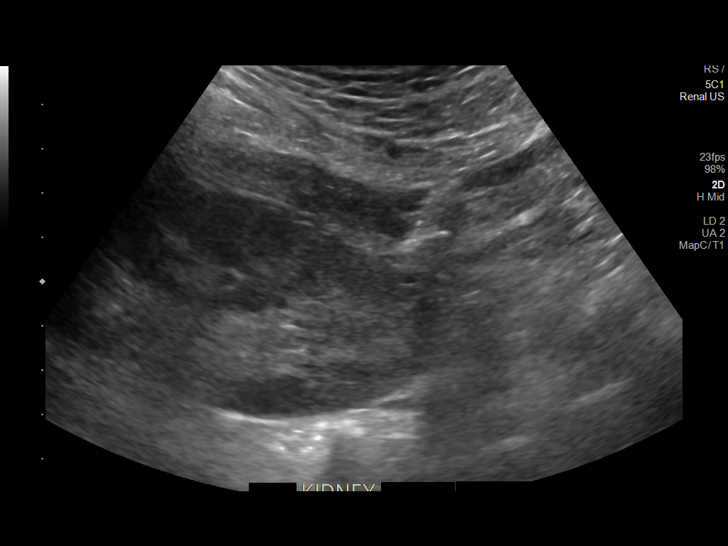

[13 of 15 positions shown; findings below may reference images not displayed]

MEDICATIONS:
None.

ANESTHESIA/SEDATION:
Fentanyl 50 mcg IV; Versed 1 mg IV

Total Moderate Sedation time: 10 minutes; The patient was
continuously monitored during the procedure by the interventional
radiology nurse under my direct supervision.

COMPLICATIONS:
None immediate.

PROCEDURE:
Informed written consent was obtained from the patient after a
discussion of the risks, benefits and alternatives to treatment. The
patient understands and consents the procedure. A timeout was
performed prior to the initiation of the procedure.

Ultrasound scanning was performed of the bilateral flanks. The
inferior pole of the left kidney was selected for biopsy due to
location and sonographic window. The procedure was planned. The
operative site was prepped and draped in the usual sterile fashion.
The overlying soft tissues were anesthetized with 1% lidocaine with
epinephrine. A 17 gauge core needle biopsy device was advanced into
the inferior cortex of the left kidney and 3 core biopsies were
obtained under direct ultrasound guidance. Images were saved for
documentation purposes. The biopsy device was removed and hemostasis
was obtained with manual compression. Post procedural scanning was
negative for significant post procedural hemorrhage or additional
complication. A dressing was placed. The patient tolerated the
procedure well without immediate post procedural complication.
IMPRESSION: Technically successful ultrasound guided left renal biopsy.

## 2019-03-13 MED ORDER — MIDAZOLAM HCL 2 MG/2ML IJ SOLN
INTRAMUSCULAR | Status: AC
  Filled 2019-03-13: qty 2

## 2019-03-13 MED ORDER — FENTANYL CITRATE (PF) 100 MCG/2ML IJ SOLN
INTRAMUSCULAR | Status: AC | PRN
  Administered 2019-03-13: 50 ug via INTRAVENOUS

## 2019-03-13 MED ORDER — TRAZODONE HCL 50 MG PO TABS
50.0000 mg | ORAL_TABLET | Freq: Once | ORAL | Status: DC
  Filled 2019-03-13: qty 1

## 2019-03-13 MED ORDER — MIDAZOLAM HCL 2 MG/2ML IJ SOLN
INTRAMUSCULAR | Status: AC | PRN
  Administered 2019-03-13: 1 mg via INTRAVENOUS

## 2019-03-13 MED ORDER — LIDOCAINE-PRILOCAINE 2.5-2.5 % EX CREA: 1.0000 "application " | TOPICAL_CREAM | CUTANEOUS | Status: DC | PRN

## 2019-03-13 MED ORDER — ALTEPLASE 2 MG IJ SOLR: 2.0000 mg | Freq: Once | INTRAMUSCULAR | Status: DC | PRN

## 2019-03-13 MED ORDER — HEPARIN SODIUM (PORCINE) 1000 UNIT/ML DIALYSIS: 1000.0000 [IU] | INTRAMUSCULAR | Status: DC | PRN

## 2019-03-13 MED ORDER — GELATIN ABSORBABLE 12-7 MM EX MISC
CUTANEOUS | Status: AC
  Filled 2019-03-13: qty 1

## 2019-03-13 MED ORDER — PENTAFLUOROPROP-TETRAFLUOROETH EX AERO: 1.0000 "application " | INHALATION_SPRAY | CUTANEOUS | Status: DC | PRN

## 2019-03-13 MED ORDER — LIDOCAINE HCL (PF) 1 % IJ SOLN
INTRAMUSCULAR | Status: AC
  Filled 2019-03-13: qty 30

## 2019-03-13 MED ORDER — LIDOCAINE HCL (PF) 1 % IJ SOLN: 5.0000 mL | INTRAMUSCULAR | Status: DC | PRN

## 2019-03-13 MED ORDER — SODIUM CHLORIDE 0.9 % IV SOLN: 100.0000 mL | INTRAVENOUS | Status: DC | PRN

## 2019-03-13 MED ORDER — FENTANYL CITRATE (PF) 100 MCG/2ML IJ SOLN
INTRAMUSCULAR | Status: AC
  Filled 2019-03-13: qty 2

## 2019-03-13 MED ORDER — LIDOCAINE-EPINEPHRINE 1 %-1:100000 IJ SOLN
INTRAMUSCULAR | Status: AC
  Filled 2019-03-13: qty 1

## 2019-03-13 NOTE — Plan of Care (Signed)
  Problem: Education: Goal: Knowledge of General Education information will improve Description Including pain rating scale, medication(s)/side effects and non-pharmacologic comfort measures Outcome: Progressing   

## 2019-03-13 NOTE — Procedures (Signed)
Pre Procedure Dx: Renal Failure Post Procedural Dx: Same  Technically successful US guided biopsy of inferior pole of the left kidney  EBL: None No immediate complications.   Ronny Bacon, MD Pager #: [phone]

## 2019-03-13 NOTE — Progress Notes (Signed)
Subjective:  Underwent 2 hour HD treatment last night- tolerated well.  Underwent renal biopsy this AM-  530 of UOP   Objective Vital signs in last 24 hours: Vitals:   03/13/19 0452 03/13/19 0750 03/13/19 0835 03/13/19 0840  BP: (!) 149/80 (!) 145/88 (!) 146/80 135/87  Pulse: (!) 56 (!) 57 (!) 59 71  Resp: 18 19 13 16   Temp: 97.8 F (36.6 C)     TempSrc: Oral     SpO2: 95% 93% 99% 99%  Weight:       Weight change:   Intake/Output Summary (Last 24 hours) at 03/13/2019 1113 Last data filed at 03/13/2019 0715 Gross per 24 hour  Intake 1473.33 ml  Output 670 ml  Net 803.33 ml    Assessment/ Plan: Pt is a 59 y.o. yo male with a negative PMhx except smoking who was admitted on 03/10/2019 with c/o nausea, dizziness labs found renal failure with a creatinine of 10.   Assessment/Plan: 1. Renal-  No PMhx-  crt of 10- no old data.  U/A with 100 of prot and >50 RBC so concern for GN.  Renal function no better over 2 days- mild hyperkalemia and likely uremic sxms.  Transferred to [facility] 3/9 in preparation for [facility] to start dialysis and to get a renal biopsy for diagnostic purposes. Serologies so far-  HIV neg, ASO neg, GBM neg, ANA pos 1:80. Anto MPO markedly positive 94.3, PR3 neg.  Was started on solumedrol 1 gram per day on 3/8- tolerating well.  Given the positive anti MPO- very suspicious for vasculitis-  will go ahead and start treatment with oral cytoxan 125 mg per day in addition to the steroids.  I talked this over with the patient and his sister-  Risk of immunosuppressant medications- making more likely for infections, and possibility of decreased WBC and hair loss discussed with patient-  Will also start bactrim for prophylaxis. Given BUN of 98 and K of 5.6 will need HD support- did brief treatment last night. No needs today.  I also d/w pt the fact that his kidney function was poor at presentation-  Will hope that we will see some response to meds but we may not.  The biopsy can give [place] more  information in terms of renal recovery - was done today  2. HTN/vol-   BP not bad, volume status OK-  Keep even with HD-  No BP meds 3. Anemia-  Check iron stores and consider ESA 4. Secondary hyperparathyroidism- check pth at some point   [place]: Basic Metabolic Panel: Recent Labs  Lab 03/11/19 0604 03/12/19 0803 03/13/19 0337  NA 139 141 140  K 5.8* 5.6* 4.6  CL 115* 111 108  CO2 15* 17* 20*  GLUCOSE 140* 127* 151*  BUN 91* 98* 83*  CREATININE 11.19* 10.64* 7.79*  CALCIUM 8.5* 8.5* 7.4*   Liver Function Tests: Recent Labs  Lab 03/10/19 0754 03/11/19 0604  AST 10* 10*  ALT 16 12  ALKPHOS 78 60  BILITOT 0.6 0.3  PROT 7.5 6.4*  ALBUMIN 3.4* 2.9*   No results for input(s): LIPASE, AMYLASE in the last 168 hours. No results for input(s): AMMONIA in the last 168 hours. CBC: Recent Labs  Lab 03/10/19 0755 03/10/19 0755 03/11/19 0604 03/12/19 0803 03/13/19 0337  WBC 9.6   < > 10.2 16.8* 14.1*  HGB 11.6*   < > 9.2* 9.2* 9.0*  HCT 36.0*   < > 29.1* 28.3* 27.5*  MCV 89.8  --  92.1 90.1 88.4  PLT 244   < > 203 221 187   < > = values in this interval not displayed.   Cardiac Enzymes: Recent Labs  Lab 03/10/19 1637  CKTOTAL 45*   CBG: Recent Labs  Lab 03/12/19 1116 03/12/19 1841 03/12/19 2128 03/13/19 0657 03/13/19 1056  GLUCAP 107* 100* 239* 135* 140*    Iron Studies: No results for input(s): IRON, TIBC, TRANSFERRIN, FERRITIN in the last 72 hours. Studies/Results: IR Fluoro Guide CV Line Right  Result Date: 03/12/2019 INDICATION: 59 year old with renal failure and starting hemodialysis. Hemodialysis catheter is needed. EXAM: FLUOROSCOPIC AND ULTRASOUND GUIDED PLACEMENT OF A TUNNELED DIALYSIS CATHETER Physician: Stephan Minister. Anselm Pancoast, MD MEDICATIONS: Ancef 2 g; The antibiotic was administered within an appropriate time interval prior to skin puncture. ANESTHESIA/SEDATION: Versed 1.0 mg IV; Fentanyl 50 mcg IV; Moderate Sedation Time:  20  minutes The patient was continuously monitored during the procedure by the interventional radiology nurse under my direct supervision. FLUOROSCOPY TIME:  Fluoroscopy Time: 48 seconds, 11.9 mGy COMPLICATIONS: None immediate. PROCEDURE: The procedure was explained to the patient. The risks and benefits of the procedure were discussed and the patient's questions were addressed. Informed consent was obtained from the patient. The patient was placed supine on the interventional table. Ultrasound confirmed a patent right internal jugular vein. Ultrasound image was obtained for documentation. The right neck and chest was prepped and draped in a sterile fashion. The right neck was anesthetized with 1% lidocaine. Maximal barrier sterile technique was utilized including caps, mask, sterile gowns, sterile gloves, sterile drape, hand hygiene and skin antiseptic. A small incision was made with #11 blade scalpel. A 21 gauge needle directed into the right internal jugular vein vein with ultrasound guidance. A micropuncture dilator set was placed. A 23 cm tip to cuff Palindrome catheter was selected. The skin below the right clavicle was anesthetized and a small incision was made with an #11 blade scalpel. A subcutaneous tunnel was formed to the vein dermatotomy site. The catheter was brought through the tunnel. The vein dermatotomy site was dilated to accommodate a peel-away sheath. The catheter was placed through the peel-away sheath and directed into the central venous structures. The tip of the catheter was placed at the SVC and right atrium junction with fluoroscopy. Fluoroscopic images were obtained for documentation. Both lumens were found to aspirate and flush well. The proper amount of heparin was flushed in both lumens. The vein dermatotomy site was closed using a single layer of absorbable suture and Dermabond. Gel-Foam placed in the subcutaneous tract. The catheter was secured to the skin using Prolene suture.  IMPRESSION: Successful placement of a right jugular tunneled dialysis catheter using ultrasound and fluoroscopic guidance. Electronically Signed   By: Markus Daft M.D.   On: 03/12/2019 18:06   IR US Guide Vasc Access Right  Result Date: 03/12/2019 INDICATION: 59 year old with renal failure and starting hemodialysis. Hemodialysis catheter is needed. EXAM: FLUOROSCOPIC AND ULTRASOUND GUIDED PLACEMENT OF A TUNNELED DIALYSIS CATHETER Physician: Stephan Minister. Anselm Pancoast, MD MEDICATIONS: Ancef 2 g; The antibiotic was administered within an appropriate time interval prior to skin puncture. ANESTHESIA/SEDATION: Versed 1.0 mg IV; Fentanyl 50 mcg IV; Moderate Sedation Time:  20 minutes The patient was continuously monitored during the procedure by the interventional radiology nurse under my direct supervision. FLUOROSCOPY TIME:  Fluoroscopy Time: 48 seconds, 14.7 mGy COMPLICATIONS: None immediate. PROCEDURE: The procedure was explained to the patient. The risks and benefits of the procedure were  discussed and the patient's questions were addressed. Informed consent was obtained from the patient. The patient was placed supine on the interventional table. Ultrasound confirmed a patent right internal jugular vein. Ultrasound image was obtained for documentation. The right neck and chest was prepped and draped in a sterile fashion. The right neck was anesthetized with 1% lidocaine. Maximal barrier sterile technique was utilized including caps, mask, sterile gowns, sterile gloves, sterile drape, hand hygiene and skin antiseptic. A small incision was made with #11 blade scalpel. A 21 gauge needle directed into the right internal jugular vein vein with ultrasound guidance. A micropuncture dilator set was placed. A 23 cm tip to cuff Palindrome catheter was selected. The skin below the right clavicle was anesthetized and a small incision was made with an #11 blade scalpel. A subcutaneous tunnel was formed to the vein dermatotomy site. The  catheter was brought through the tunnel. The vein dermatotomy site was dilated to accommodate a peel-away sheath. The catheter was placed through the peel-away sheath and directed into the central venous structures. The tip of the catheter was placed at the SVC and right atrium junction with fluoroscopy. Fluoroscopic images were obtained for documentation. Both lumens were found to aspirate and flush well. The proper amount of heparin was flushed in both lumens. The vein dermatotomy site was closed using a single layer of absorbable suture and Dermabond. Gel-Foam placed in the subcutaneous tract. The catheter was secured to the skin using Prolene suture. IMPRESSION: Successful placement of a right jugular tunneled dialysis catheter using ultrasound and fluoroscopic guidance. Electronically Signed   By: Markus Daft M.D.   On: 03/12/2019 18:06   US BIOPSY (KIDNEY)  Result Date: 03/13/2019 INDICATION: Renal failure of uncertain etiology. Please perform ultrasound-guided random renal biopsy for tissue diagnostic purposes. EXAM: ULTRASOUND GUIDED RENAL BIOPSY COMPARISON:  Renal ultrasound-03/10/2019 MEDICATIONS: None. ANESTHESIA/SEDATION: Fentanyl 50 mcg IV; Versed 1 mg IV Total Moderate Sedation time: 10 minutes; The patient was continuously monitored during the procedure by the interventional radiology nurse under my direct supervision. COMPLICATIONS: None immediate. PROCEDURE: Informed written consent was obtained from the patient after a discussion of the risks, benefits and alternatives to treatment. The patient understands and consents the procedure. A timeout was performed prior to the initiation of the procedure. Ultrasound scanning was performed of the bilateral flanks. The inferior pole of the left kidney was selected for biopsy due to location and sonographic window. The procedure was planned. The operative site was prepped and draped in the usual sterile fashion. The overlying soft tissues were  anesthetized with 1% lidocaine with epinephrine. A 17 gauge core needle biopsy device was advanced into the inferior cortex of the left kidney and 3 core biopsies were obtained under direct ultrasound guidance. Images were saved for documentation purposes. The biopsy device was removed and hemostasis was obtained with manual compression. Post procedural scanning was negative for significant post procedural hemorrhage or additional complication. A dressing was placed. The patient tolerated the procedure well without immediate post procedural complication. IMPRESSION: Technically successful ultrasound guided left renal biopsy. Electronically Signed   By: Sandi Mariscal M.D.   On: 03/13/2019 09:36   Medications: Infusions: . sodium chloride 75 mL/hr at 03/13/19 0441  . sodium chloride    . sodium chloride      Scheduled Medications: . Chlorhexidine Gluconate Cloth  6 each Topical Q0600  . cyclophosphamide  125 mg Oral Daily  . insulin aspart  0-9 Units Subcutaneous TID WC  . predniSONE  60  mg Oral Q breakfast  . sulfamethoxazole-trimethoprim  1 tablet Oral Daily    have reviewed scheduled and prn medications.  Physical Exam: General: NAD, alert Heart: some low HR Lungs: mostly clear Abdomen: soft, non tender Extremities: no edema    03/13/2019,11:13 AM  LOS: 2 days

## 2019-03-13 NOTE — Progress Notes (Signed)
PROGRESS NOTE    Gabriel Harris  EQA:[id number] DOB: 1960/09/24 DOA: 03/10/2019 PCP: Patient, No Pcp Per   Brief Narrative: Per HPI: 59 y.o.malewithno significant past medical history except for tobacco use disorder who presented to the [facility] ED with complaints of 2 weeks of worsening dizziness, nausea, tinnitus, diplopia, spinning sensation, associated with 3 days of diarrhea and at least 5 episodes of emesis. In the ED, MRI negative for any intracranial findings but was found to have significantly elevated creatinine greater than 10.  Was started on IV fluid and nephrology was consulted.  Work-up showed concern for possible GN.  Unfortunately creatinine continues to trend up at >11 with oliguria.  Nephrology recommended transfer to [facility] for possible hemodialysis.  3/10 IR has been consulted for placement of line for HD  Subjective: Patient reports she has no more headache dizziness has improved. No acute events overnight. Status post tunneled dialysis catheter placement in the right IJ 3/10 had first dialysis Underwent renal biopsy this morning. Labs with potassium 4.6, creatinine 7.7.  WBC downtrending 14.1.  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  Sediment suggestive of glomerulonephritis, renal ultrasound without hydronephrosis and creatinine did not improve with hydration.  Status post right IJ HD catheter 3/10 and HD first 3/10. n for renal biopsy today as per nephrology.  Initiated on prednisone 60 mg daily, cyclophosphamide 125 mg daily and Bactrim 1 tablet daily.  Continue normal saline gently per nephrology at 75 mils per hour.  Urine output 530.  Monitor intake output, renal function closely.  Hyperkalemia : Improved with Lokelma and dialysis.    Hypertension blood pressure fairly stable..  Vertigo/Diplopia: Unclear etiology MRI brain no acute  finding.  Likely in the setting of uremia severe AKI.  Feels improved after starting dialysis. Continue PT OT fall precaution, monitor orthostatics.  Normocytic anemia: Stable 9 g range..  In the setting of renal failure.  Monitor.  Leukocytosis- wbc up 16k and improving.?  Cause likely reactive/steroid. No fever.  Monitor.  Sinus bradycardia-stable.  Tobacco use: Cessation advised.  DVT prophylaxis:SCD Code Status:FULL Family Communication: plan of care discussed with patient at bedside. Disposition Plan: Patient is from:HOME Anticipated Disposition: to HOME Barriers to discharge or conditions that needs to be met prior to discharge: patient admitted with severe renal failure, acute- needing initiation of dialysis, renal biopsy and pending further stabilization signing off by nephrology.  Remains in the hospital.  Consultants: NEPHRO Procedures:see note Microbiology:see note  Medications: Scheduled Meds: . Chlorhexidine Gluconate Cloth  6 each Topical Q0600  . cyclophosphamide  125 mg Oral Daily  . insulin aspart  0-9 Units Subcutaneous TID WC  . predniSONE  60 mg Oral Q breakfast  . sulfamethoxazole-trimethoprim  1 tablet Oral Daily   Continuous Infusions: . sodium chloride 75 mL/hr at 03/13/19 0441    Antimicrobials: Anti-infectives (From admission, onward)   Start     Dose/Rate Route Frequency Ordered Stop   03/12/19 1536  ceFAZolin (ANCEF) 2-4 GM/100ML-% IVPB    Note to Pharmacy: Rodney Booze   : cabinet override      03/12/19 1536 03/12/19 1606   03/12/19 1300  sulfamethoxazole-trimethoprim (BACTRIM) 400-80 MG per tablet 1 tablet     1 tablet Oral Daily 03/12/19 1259     03/12/19 0800  ceFAZolin (ANCEF) IVPB 2g/100 mL premix     2 g 200 mL/hr over 30 Minutes Intravenous  Once 03/11/19 1615 03/12/19 1610       Objective: Vitals: Today's Vitals   03/12/19 1800 03/12/19 1821 03/12/19 2129 03/13/19 0452  BP: 132/62 130/66 136/81 (!) 149/80  Pulse: (!) 48 (!)  50 63 (!) 56  Resp:  18 18 18   Temp:  98.4 F (36.9 C) 98.6 F (37 C) 97.8 F (36.6 C)  TempSrc:  Oral Oral Oral  SpO2:  98% 98% 95%  Weight:  76.2 kg    PainSc:  0-No pain 0-No pain     Intake/Output Summary (Last 24 hours) at 03/13/2019 0742 Last data filed at 03/13/2019 0715 Gross per 24 hour  Intake 1473.33 ml  Output 670 ml  Net 803.33 ml   Filed Weights   03/11/19 0456 03/12/19 1615 03/12/19 1821  Weight: 69.1 kg 75.7 kg 76.2 kg   Weight change:    Intake/Output from previous day: 03/10 0701 - 03/11 0700 In: 1473.3 [P.O.:300; I.V.:1073.3; IV Piggyback:100] Out: 530 [Urine:530] Intake/Output this shift: Total I/O In: -  Out: 140 [Urine:140]  Examination:  General exam: AAOx3 , pleasant, not in distress,  HEENT:Oral mucosa moist, Ear/Nose WNL grossly,dentition normal. Respiratory system: bilaterally clear breath sounds, no wheezing or crackles, no use of accessory muscles.   Cardiovascular system: S1 & S2 +, regular, No JVD. Gastrointestinal system: Abdomen soft, NT,ND, BS+. Nervous System:Alert, awake, moving extremities and grossly nonfocal Extremities: No edema, distal peripheral pulses palpable.  Skin: No rashes,no icterus. MSK: Normal muscle bulk,tone, power.  Data Reviewed: I have personally reviewed following labs and imaging studies CBC: Recent Labs  Lab 03/10/19 0755 03/11/19 0604 03/12/19 0803 03/13/19 0337  WBC 9.6 10.2 16.8* 14.1*  HGB 11.6* 9.2* 9.2* 9.0*  HCT 36.0* 29.1* 28.3* 27.5*  MCV 89.8 92.1 90.1 88.4  PLT 244 203 221 326   Basic Metabolic Panel: Recent Labs  Lab 03/10/19 1446 03/10/19 2034 03/11/19 0604 03/12/19 0803 03/13/19 0337  NA 138 137 139 141 140  K 6.2* 6.1* 5.8* 5.6* 4.6  CL 112* 112* 115* 111 108  CO2 19* 16* 15* 17* 20*  GLUCOSE 143* 246* 140* 127* 151*  BUN 85* 85* 91* 98* 83*  CREATININE 10.58* 10.60* 11.19* 10.64* 7.79*  CALCIUM 8.2* 7.8* 8.5* 8.5* 7.4*   GFR: CrCl cannot be calculated (Unknown ideal  weight.). Liver Function Tests: Recent Labs  Lab 03/10/19 0754 03/11/19 0604  AST 10* 10*  ALT 16 12  ALKPHOS 78 60  BILITOT 0.6 0.3  PROT 7.5 6.4*  ALBUMIN 3.4* 2.9*   No results for input(s): LIPASE, AMYLASE in the last 168 hours. No results for input(s): AMMONIA in the last 168 hours. Coagulation Profile: Recent Labs  Lab 03/12/19 0838  INR 1.1   Cardiac Enzymes: Recent Labs  Lab 03/10/19 1637  CKTOTAL 45*   BNP (last 3 results) No results for input(s): PROBNP in the last 8760 hours. HbA1C: Recent Labs    03/11/19 0604  HGBA1C 5.8*   CBG: Recent Labs  Lab 03/12/19 0722 03/12/19 1116 03/12/19 1841 03/12/19 2128 03/13/19 0657  GLUCAP 109* 107* 100* 239* 135*   Lipid Profile: No results for input(s): CHOL, HDL, LDLCALC, TRIG, CHOLHDL, LDLDIRECT in the last 72 hours. Thyroid Function Tests: Recent Labs    03/10/19 1602  TSH 3.147   Anemia Panel: No results for input(s): VITAMINB12, FOLATE, FERRITIN, TIBC, IRON, RETICCTPCT in the last 72 hours. Sepsis Labs: No results for input(s): PROCALCITON, LATICACIDVEN in the last 168 hours.  Recent Results (from the past 240  hour(s))  SARS CORONAVIRUS 2 (TAT 6-24 HRS) Nasopharyngeal Nasopharyngeal Swab     Status: None   Collection Time: 03/10/19  9:42 AM   Specimen: Nasopharyngeal Swab  Result Value Ref Range Status   SARS Coronavirus 2 NEGATIVE NEGATIVE Final    Comment: (NOTE) SARS-CoV-2 target nucleic acids are NOT DETECTED. The SARS-CoV-2 RNA is generally detectable in upper and lower respiratory specimens during the acute phase of infection. Negative results do not preclude SARS-CoV-2 infection, do not rule out co-infections with other pathogens, and should not be used as the sole basis for treatment or other patient management decisions. Negative results must be combined with clinical observations, patient history, and epidemiological information. The expected result is Negative. Fact Sheet for  Patients: SugarRoll.be Fact Sheet for Healthcare Providers: https://www.woods-mathews.com/ This test is not yet approved or cleared by the [place] FDA and  has been authorized for detection and/or diagnosis of SARS-CoV-2 by FDA under an Emergency Use Authorization (EUA). This EUA will remain  in effect (meaning this test can be used) for the duration of the COVID-19 declaration under Section 56 4(b)(1) of the Act, 21 U.S.C. section 360bbb-3(b)(1), unless the authorization is terminated or revoked sooner. Performed at [facility], [place] [address], [place], [place] [address]       Radiology Studies: IR Fluoro Guide CV Line Right  Result Date: 03/12/2019 INDICATION: 59 year old with renal failure and starting hemodialysis. Hemodialysis catheter is needed. EXAM: FLUOROSCOPIC AND ULTRASOUND GUIDED PLACEMENT OF A TUNNELED DIALYSIS CATHETER Physician: Stephan Minister. Anselm Pancoast, MD MEDICATIONS: Ancef 2 g; The antibiotic was administered within an appropriate time interval prior to skin puncture. ANESTHESIA/SEDATION: Versed 1.0 mg IV; Fentanyl 50 mcg IV; Moderate Sedation Time:  20 minutes The patient was continuously monitored during the procedure by the interventional radiology nurse under my direct supervision. FLUOROSCOPY TIME:  Fluoroscopy Time: 48 seconds, 46.9 mGy COMPLICATIONS: None immediate. PROCEDURE: The procedure was explained to the patient. The risks and benefits of the procedure were discussed and the patient's questions were addressed. Informed consent was obtained from the patient. The patient was placed supine on the interventional table. Ultrasound confirmed a patent right internal jugular vein. Ultrasound image was obtained for documentation. The right neck and chest was prepped and draped in a sterile fashion. The right neck was anesthetized with 1% lidocaine. Maximal barrier sterile technique was utilized including caps, mask, sterile gowns,  sterile gloves, sterile drape, hand hygiene and skin antiseptic. A small incision was made with #11 blade scalpel. A 21 gauge needle directed into the right internal jugular vein vein with ultrasound guidance. A micropuncture dilator set was placed. A 23 cm tip to cuff Palindrome catheter was selected. The skin below the right clavicle was anesthetized and a small incision was made with an #11 blade scalpel. A subcutaneous tunnel was formed to the vein dermatotomy site. The catheter was brought through the tunnel. The vein dermatotomy site was dilated to accommodate a peel-away sheath. The catheter was placed through the peel-away sheath and directed into the central venous structures. The tip of the catheter was placed at the SVC and right atrium junction with fluoroscopy. Fluoroscopic images were obtained for documentation. Both lumens were found to aspirate and flush well. The proper amount of heparin was flushed in both lumens. The vein dermatotomy site was closed using a single layer of absorbable suture and Dermabond. Gel-Foam placed in the subcutaneous tract. The catheter was secured to the skin using Prolene suture. IMPRESSION: Successful placement of a  right jugular tunneled dialysis catheter using ultrasound and fluoroscopic guidance. Electronically Signed   By: Markus Daft M.D.   On: 03/12/2019 18:06   IR US Guide Vasc Access Right  Result Date: 03/12/2019 INDICATION: 59 year old with renal failure and starting hemodialysis. Hemodialysis catheter is needed. EXAM: FLUOROSCOPIC AND ULTRASOUND GUIDED PLACEMENT OF A TUNNELED DIALYSIS CATHETER Physician: Stephan Minister. Anselm Pancoast, MD MEDICATIONS: Ancef 2 g; The antibiotic was administered within an appropriate time interval prior to skin puncture. ANESTHESIA/SEDATION: Versed 1.0 mg IV; Fentanyl 50 mcg IV; Moderate Sedation Time:  20 minutes The patient was continuously monitored during the procedure by the interventional radiology nurse under my direct supervision.  FLUOROSCOPY TIME:  Fluoroscopy Time: 48 seconds, 48.1 mGy COMPLICATIONS: None immediate. PROCEDURE: The procedure was explained to the patient. The risks and benefits of the procedure were discussed and the patient's questions were addressed. Informed consent was obtained from the patient. The patient was placed supine on the interventional table. Ultrasound confirmed a patent right internal jugular vein. Ultrasound image was obtained for documentation. The right neck and chest was prepped and draped in a sterile fashion. The right neck was anesthetized with 1% lidocaine. Maximal barrier sterile technique was utilized including caps, mask, sterile gowns, sterile gloves, sterile drape, hand hygiene and skin antiseptic. A small incision was made with #11 blade scalpel. A 21 gauge needle directed into the right internal jugular vein vein with ultrasound guidance. A micropuncture dilator set was placed. A 23 cm tip to cuff Palindrome catheter was selected. The skin below the right clavicle was anesthetized and a small incision was made with an #11 blade scalpel. A subcutaneous tunnel was formed to the vein dermatotomy site. The catheter was brought through the tunnel. The vein dermatotomy site was dilated to accommodate a peel-away sheath. The catheter was placed through the peel-away sheath and directed into the central venous structures. The tip of the catheter was placed at the SVC and right atrium junction with fluoroscopy. Fluoroscopic images were obtained for documentation. Both lumens were found to aspirate and flush well. The proper amount of heparin was flushed in both lumens. The vein dermatotomy site was closed using a single layer of absorbable suture and Dermabond. Gel-Foam placed in the subcutaneous tract. The catheter was secured to the skin using Prolene suture. IMPRESSION: Successful placement of a right jugular tunneled dialysis catheter using ultrasound and fluoroscopic guidance. Electronically Signed    By: Markus Daft M.D.   On: 03/12/2019 18:06     LOS: 2 days   Time spent: More than 50% of that time was spent in counseling and/or coordination of care.  Antonieta Pert, MD [facility]  03/13/2019, 7:42 AM

## 2019-03-14 LAB — CBC
HCT: 31.5 % — ABNORMAL LOW (ref 39.0–52.0)
Hemoglobin: 10 g/dL — ABNORMAL LOW (ref 13.0–17.0)
MCH: 29.2 pg (ref 26.0–34.0)
MCHC: 31.7 g/dL (ref 30.0–36.0)
MCV: 91.8 fL (ref 80.0–100.0)
Platelets: 211 10*3/uL (ref 150–400)
RBC: 3.43 MIL/uL — ABNORMAL LOW (ref 4.22–5.81)
RDW: 13.5 % (ref 11.5–15.5)
WBC: 15.1 10*3/uL — ABNORMAL HIGH (ref 4.0–10.5)
nRBC: 0 % (ref 0.0–0.2)

## 2019-03-14 LAB — BASIC METABOLIC PANEL
Anion gap: 12 (ref 5–15)
BUN: 103 mg/dL — ABNORMAL HIGH (ref 6–20)
CO2: 20 mmol/L — ABNORMAL LOW (ref 22–32)
Calcium: 8 mg/dL — ABNORMAL LOW (ref 8.9–10.3)
Chloride: 109 mmol/L (ref 98–111)
Creatinine, Ser: 8.34 mg/dL — ABNORMAL HIGH (ref 0.61–1.24)
GFR calc Af Amer: 7 mL/min — ABNORMAL LOW (ref >60)
GFR calc non Af Amer: 6 mL/min — ABNORMAL LOW (ref >60)
Glucose, Bld: 128 mg/dL — ABNORMAL HIGH (ref 70–99)
Potassium: 4.5 mmol/L (ref 3.5–5.1)
Sodium: 141 mmol/L (ref 135–145)

## 2019-03-14 LAB — GLUCOSE, CAPILLARY
Glucose-Capillary: 112 mg/dL — ABNORMAL HIGH (ref 70–99)
Glucose-Capillary: 93 mg/dL (ref 70–99)
Glucose-Capillary: 117 mg/dL — ABNORMAL HIGH (ref 70–99)
Glucose-Capillary: 113 mg/dL — ABNORMAL HIGH (ref 70–99)

## 2019-03-14 LAB — IRON AND TIBC
Iron: 120 ug/dL (ref 45–182)
Saturation Ratios: 52 % — ABNORMAL HIGH (ref 17.9–39.5)
TIBC: 232 ug/dL — ABNORMAL LOW (ref 250–450)
UIBC: 112 ug/dL

## 2019-03-14 LAB — FERRITIN: Ferritin: 233 ng/mL (ref 24–336)

## 2019-03-14 MED ORDER — CHLORHEXIDINE GLUCONATE CLOTH 2 % EX PADS
6.0000 | MEDICATED_PAD | Freq: Every day | CUTANEOUS | Status: DC
  Administered 2019-03-18 – 2019-04-04 (×15): 6 via TOPICAL

## 2019-03-14 MED ORDER — ZOLPIDEM TARTRATE 5 MG PO TABS
5.0000 mg | ORAL_TABLET | Freq: Every evening | ORAL | Status: DC | PRN
  Administered 2019-03-15 – 2019-04-02 (×17): 5 mg via ORAL
  Filled 2019-03-14 (×18): qty 1

## 2019-03-14 MED ORDER — HEPARIN SODIUM (PORCINE) 1000 UNIT/ML DIALYSIS: 20.0000 [IU]/kg | INTRAMUSCULAR | Status: DC | PRN

## 2019-03-14 NOTE — Progress Notes (Addendum)
Subjective Gabriel Harris is resting in bed in NAD and without any overnight events. He endorses pain at site of biopsy, nausea, fatigue, dizziness and increased thirst today. Yesterday s/p HD #1 he felt much better than today.  He denies SOB, cough, CP, palpitations, emesis, or diarrhea.   Objective  Physical Exam  Constitutional: He is oriented to person, place, and time and well-developed, well-nourished, and in no distress. He appears lethargic.  Cardiovascular: Normal rate, regular rhythm, S1 normal, S2 normal and normal heart sounds. Exam reveals no gallop and no friction rub.  No murmur heard. Pulmonary/Chest: Effort normal and breath sounds normal. No respiratory distress. He has no wheezes. He has no rales.  Abdominal: Soft. Bowel sounds are normal. He exhibits no distension. There is no abdominal tenderness.  Musculoskeletal:        General: No tenderness or edema.  Neurological: He is oriented to person, place, and time. He appears lethargic.  Skin: Skin is warm and dry.    Intake/Output Summary (Last 24 hours) at 03/14/2019 1115 Last data filed at 03/14/2019 0900 Gross per 24 hour  Intake 2654.36 ml  Output 805 ml  Net 1849.36 ml   Blood pressure 140/80, pulse 60, temperature 98.5 F (36.9 C), temperature source Oral, resp. rate 18, weight 78 kg, SpO2 95 %.  Results for orders placed or performed during the hospital encounter of 03/10/19 (from the past 24 hour(s))  Hepatitis B surface antigen     Status: None   Collection Time: 03/13/19 12:08 PM  Result Value Ref Range   Hepatitis B Surface Ag NON REACTIVE NON REACTIVE  Hepatitis B surface antibody     Status: None   Collection Time: 03/13/19 12:08 PM  Result Value Ref Range   Hep B S Ab NON REACTIVE NON REACTIVE  Hepatitis B core antibody, total     Status: None   Collection Time: 03/13/19 12:08 PM  Result Value Ref Range   Hep B Core Total Ab NON REACTIVE NON REACTIVE  Glucose, capillary     Status: Abnormal   Collection Time: 03/13/19  4:29 PM  Result Value Ref Range   Glucose-Capillary 157 (H) 70 - 99 mg/dL  Glucose, capillary     Status: Abnormal   Collection Time: 03/13/19  8:56 PM  Result Value Ref Range   Glucose-Capillary 121 (H) 70 - 99 mg/dL  Glucose, capillary     Status: Abnormal   Collection Time: 03/14/19  6:45 AM  Result Value Ref Range   Glucose-Capillary 112 (H) 70 - 99 mg/dL  Basic metabolic panel     Status: Abnormal   Collection Time: 03/14/19  8:26 AM  Result Value Ref Range   Sodium 141 135 - 145 mmol/L   Potassium 4.5 3.5 - 5.1 mmol/L   Chloride 109 98 - 111 mmol/L   CO2 20 (L) 22 - 32 mmol/L   Glucose, Bld 128 (H) 70 - 99 mg/dL   BUN 103 (H) 6 - 20 mg/dL   Creatinine, Ser 8.34 (H) 0.61 - 1.24 mg/dL   Calcium 8.0 (L) 8.9 - 10.3 mg/dL   GFR calc non Af Amer 6 (L) >60 mL/min   GFR calc Af Amer 7 (L) >60 mL/min   Anion gap 12 5 - 15  CBC     Status: Abnormal   Collection Time: 03/14/19  8:26 AM  Result Value Ref Range   WBC 15.1 (H) 4.0 - 10.5 K/uL   RBC 3.43 (L) 4.22 - 5.81 MIL/uL  Hemoglobin 10.0 (L) 13.0 - 17.0 g/dL   HCT 31.5 (L) 39.0 - 52.0 %   MCV 91.8 80.0 - 100.0 fL   MCH 29.2 26.0 - 34.0 pg   MCHC 31.7 30.0 - 36.0 g/dL   RDW 13.5 11.5 - 15.5 %   Platelets 211 150 - 400 K/uL   nRBC 0.0 0.0 - 0.2 %  Iron and TIBC     Status: Abnormal   Collection Time: 03/14/19  8:26 AM  Result Value Ref Range   Iron 120 45 - 182 ug/dL   TIBC 232 (L) 250 - 450 ug/dL   Saturation Ratios 52 (H) 17.9 - 39.5 %   UIBC 112 ug/dL  Ferritin     Status: None   Collection Time: 03/14/19  8:26 AM  Result Value Ref Range   Ferritin 233 24 - 336 ng/mL   Assessment & Plan #AKI No PMhx-  crt of 10- no old data. U/A with 100 of prot and >50 RBC so concern for GN. Renal function no better over 2 days- mild hyperkalemia and likely uremic sxms.  Transferred to [facility] 3/9 in preparation for [facility] to start dialysis and to get a renal biopsy for diagnostic purposes. Serologies so far-   HIV neg, ASO neg, GBM neg, ANA pos 1:80. Anto MPO markedly positive 94.3, PR3 neg.    Given the positive anti MPO- very suspicious for vasculitis. BUN of 98 and K of 5.6>>HD support initially on  3/10.  Will hope that we will see some response to meds but we may not.  Awaiting biopsy results (collected 3/11) for prognostic purposes - cytoxan 125 mg per day -solumedrol 1G daily (initiated 3/8) -prednisone 60mg  -prophylactic bactrim 400-80mg  daily  Since pt feels lousy and BUN did increase from yest to today will go ahead and do HD, then do PRN  #HTN/volume status BP not problematic thus far and adequate volume status- Keep even with HD-  No BP meds #Anemia  iron stores OK and consider ESA if hgb drops further  -Hgb 10.0 (3/12)    Component Value Date/Time   IRON 120 03/14/2019 0826   TIBC 232 (L) 03/14/2019 0826   FERRITIN 233 03/14/2019 0826   IRONPCTSAT 52 (H) 03/14/2019 0826   #Secondary hyperparathyroidism -corrected calcium 9.4 (3/9) -PTH pending  Patient seen and examined, agree with above note with above modifications.  Made nearly a liter of urine BUT BUN and crt rose and he feels lousy- probably uremic.  Will do another HD today then PRN.  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  Await renal biopsy results Corliss Parish, MD 03/14/2019

## 2019-03-14 NOTE — Progress Notes (Signed)
PROGRESS NOTE    Gabriel Harris  TOI:[id number] DOB: [birth year] DOA: 03/10/2019 PCP: Patient, No Pcp Per   Brief Narrative: Per HPI: 59 y.o.malewithno significant past medical history except for tobacco use disorder who presented to the [facility] ED with complaints of 2 weeks of worsening dizziness, nausea, tinnitus, diplopia, spinning sensation, associated with 3 days of diarrhea and at least 5 episodes of emesis. In the ED, MRI negative for any intracranial findings but was found to have significantly elevated creatinine greater than 10.  Was started on IV fluid and nephrology was consulted.  Work-up showed concern for possible GN.  Unfortunately creatinine continues to trend up at >11 with oliguria.  Nephrology recommended transfer to [facility] for possible hemodialysis.  3/10 IR has been consulted for placement of line for HD  Subjective:  No new complaints. 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  Sediment suggestive of glomerulonephritis, renal ultrasound without hydronephrosis and creatinine did not improve with hydration.  Status post right IJ HD catheter 3/10 and HD first 3/10, 2 hrs only tolerated. S/p renal biopsy 3/11 and initiated on prednisone 60 mg daily, cyclophosphamide 125 mg daily and Bactrim 1 tablet daily.  BUN creatinine continues to worsen, for dialysis again today, continue normal saline gently per nephrology at 75 mils per hour 945 mL, picking up. Monitor intake output, renal function closely. Recent Labs  Lab 03/10/19 2034 03/11/19 0604  03/12/19 0803 03/13/19 0337 03/14/19 0826  BUN 85* 91* 98* 83* 103*  CREATININE 10.60* 11.19* 10.64* 7.79* 8.34*   Hyperkalemia : Improved with Lokelma and dialysis.    Hypertension blood pressure fairly stable..  Vertigo/Diplopia: Unclear etiology MRI brain no acute finding.  Likely in the setting of uremia severe AKI.  Symptoms improving.  Continue conservative management continue dialysis treatment as #1.encourage ambulation.   Normocytic anemia: Stable 9 g range..  In the setting of renal failure.  Monitor.  Leukocytosis-? Cause likely reactive/steroid. No fever.  Monitor. Recent Labs  Lab 03/10/19 0755 03/11/19 0604 03/12/19 0803 03/13/19 0337 03/14/19 0826  WBC 9.6 10.2 16.8* 14.1* 15.1*    Sinus bradycardia-stable.  Tobacco use: Cessation advised.  DVT prophylaxis:SCD Code Status:FULL Family Communication: plan of care discussed with patient at bedside. Disposition Plan: Patient is from:HOME Anticipated Disposition: to HOME Barriers to discharge or conditions that needs to be met prior to discharge: patient admitted with severe renal failure, acute- needing initiation of dialysis, renal biopsy and pending further stabilization signing off by nephrology.  Remains in the hospital.  Consultants: NEPHRO Procedures:see note Microbiology:see note  Medications: Scheduled Meds: . Chlorhexidine Gluconate Cloth  6 each Topical Q0600  . cyclophosphamide  125 mg Oral Daily  . insulin aspart  0-9 Units Subcutaneous TID WC  . predniSONE  60 mg Oral Q breakfast  . sulfamethoxazole-trimethoprim  1 tablet Oral Daily  . traZODone  50 mg Oral Once   Continuous Infusions: . sodium chloride 75 mL/hr at 03/14/19 0851  . sodium chloride    . sodium chloride      Antimicrobials: Anti-infectives (From admission, onward)   Start  Dose/Rate Route Frequency Ordered Stop   03/12/19 1536  ceFAZolin (ANCEF) 2-4 GM/100ML-% IVPB    Note to Pharmacy: Rodney Booze   : cabinet  override      03/12/19 1536 03/12/19 1606   03/12/19 1300  sulfamethoxazole-trimethoprim (BACTRIM) 400-80 MG per tablet 1 tablet     1 tablet Oral Daily 03/12/19 1259     03/12/19 0800  ceFAZolin (ANCEF) IVPB 2g/100 mL premix     2 g 200 mL/hr over 30 Minutes Intravenous  Once 03/11/19 1615 03/12/19 1610       Objective: Vitals: Today's Vitals   03/13/19 0901 03/13/19 1632 03/13/19 1900 03/13/19 2100  BP:  134/85 132/78   Pulse:  (!) 55 (!) 55   Resp:  18 16   Temp:  98 F (36.7 C) 98 F (36.7 C)   TempSrc:  Oral Oral   SpO2:  97% 96%   Weight:   78 kg   PainSc: 0-No pain   0-No pain    Intake/Output Summary (Last 24 hours) at 03/14/2019 0901 Last data filed at 03/14/2019 0600 Gross per 24 hour  Intake 2673.47 ml  Output 805 ml  Net 1868.47 ml   Filed Weights   03/12/19 1615 03/12/19 1821 03/13/19 1900  Weight: 75.7 kg 76.2 kg 78 kg   Weight change: 2.3 kg   Intake/Output from previous day: 03/11 0701 - 03/12 0700 In: 2673.5 [P.O.:980; I.V.:1693.5] Out: 945 [Urine:945] Intake/Output this shift: No intake/output data recorded.  Examination:  General exam: AAOx3 , not in acute distress, pleasant, on room air.  HEENT:Oral mucosa moist, Ear/Nose WNL grossly,dentition normal. Respiratory system: bilaterally clear breath sounds, no wheezing or crackles, no use of accessory muscles.   Cardiovascular system: S1 & S2 +, regular, No JVD. Gastrointestinal system: Abdomen soft, NT,ND, BS+. Nervous System:Alert, awake, moving extremities and grossly nonfocal. Extremities: No edema, distal peripheral pulses palpable.  Skin: No rashes,no icterus. MSK: Normal muscle bulk,tone, power. Biopsy site w/ small bruise and Band-Aid in place.  Data Reviewed: I have personally reviewed following labs and imaging studies CBC: Recent Labs  Lab 03/10/19 0755 03/11/19 0604 03/12/19 0803 03/13/19 0337 03/14/19 0826  WBC 9.6 10.2 16.8* 14.1* 15.1*  HGB 11.6* 9.2* 9.2* 9.0* 10.0*   HCT 36.0* 29.1* 28.3* 27.5* 31.5*  MCV 89.8 92.1 90.1 88.4 91.8  PLT 244 203 221 187 604   Basic Metabolic Panel: Recent Labs  Lab 03/10/19 1446 03/10/19 2034 03/11/19 0604 03/12/19 0803 03/13/19 0337  NA 138 137 139 141 140  K 6.2* 6.1* 5.8* 5.6* 4.6  CL 112* 112* 115* 111 108  CO2 19* 16* 15* 17* 20*  GLUCOSE 143* 246* 140* 127* 151*  BUN 85* 85* 91* 98* 83*  CREATININE 10.58* 10.60* 11.19* 10.64* 7.79*  CALCIUM 8.2* 7.8* 8.5* 8.5* 7.4*   GFR: CrCl cannot be calculated (Unknown ideal weight.). Liver Function Tests: Recent Labs  Lab 03/10/19 0754 03/11/19 0604  AST 10* 10*  ALT 16 12  ALKPHOS 78 60  BILITOT 0.6 0.3  PROT 7.5 6.4*  ALBUMIN 3.4* 2.9*   No results for input(s): LIPASE, AMYLASE in the last 168 hours. No results for input(s): AMMONIA in the last 168 hours. Coagulation Profile: Recent Labs  Lab 03/12/19 0838  INR 1.1   Cardiac Enzymes: Recent Labs  Lab 03/10/19 1637  CKTOTAL 45*   BNP (last 3 results) No results for input(s): PROBNP in the last 8760 hours. HbA1C: No results for input(s): HGBA1C in the last 72  hours. CBG: Recent Labs  Lab 03/12/19 2128 03/13/19 0657 03/13/19 1056 03/13/19 1629 03/13/19 2056  GLUCAP 239* 135* 140* 157* 121*   Lipid Profile: No results for input(s): CHOL, HDL, LDLCALC, TRIG, CHOLHDL, LDLDIRECT in the last 72 hours. Thyroid Function Tests: No results for input(s): TSH, T4TOTAL, FREET4, T3FREE, THYROIDAB in the last 72 hours. Anemia Panel: No results for input(s): VITAMINB12, FOLATE, FERRITIN, TIBC, IRON, RETICCTPCT in the last 72 hours. Sepsis Labs: No results for input(s): PROCALCITON, LATICACIDVEN in the last 168 hours.  Recent Results (from the past 240 hour(s))  SARS CORONAVIRUS 2 (TAT 6-24 HRS) Nasopharyngeal Nasopharyngeal Swab     Status: None   Collection Time: 03/10/19  9:42 AM   Specimen: Nasopharyngeal Swab  Result Value Ref Range Status   SARS Coronavirus 2 NEGATIVE NEGATIVE Final     Comment: (NOTE) SARS-CoV-2 target nucleic acids are NOT DETECTED. The SARS-CoV-2 RNA is generally detectable in upper and lower respiratory specimens during the acute phase of infection. Negative results do not preclude SARS-CoV-2 infection, do not rule out co-infections with other pathogens, and should not be used as the sole basis for treatment or other patient management decisions. Negative results must be combined with clinical observations, patient history, and epidemiological information. The expected result is Negative. Fact Sheet for Patients: SugarRoll.be Fact Sheet for Healthcare Providers: https://www.woods-mathews.com/ This test is not yet approved or cleared by the [place] FDA and  has been authorized for detection and/or diagnosis of SARS-CoV-2 by FDA under an Emergency Use Authorization (EUA). This EUA will remain  in effect (meaning this test can be used) for the duration of the COVID-19 declaration under Section 56 4(b)(1) of the Act, 21 U.S.C. section 360bbb-3(b)(1), unless the authorization is terminated or revoked sooner. Performed at [facility], [place] [address] [place], [place] [address]       Radiology Studies: IR Fluoro Guide CV Line Right  Result Date: 03/12/2019 INDICATION: 59 year old with renal failure and starting hemodialysis. Hemodialysis catheter is needed. EXAM: FLUOROSCOPIC AND ULTRASOUND GUIDED PLACEMENT OF A TUNNELED DIALYSIS CATHETER Physician: Stephan Minister. Anselm Pancoast, MD MEDICATIONS: Ancef 2 g; The antibiotic was administered within an appropriate time interval prior to skin puncture. ANESTHESIA/SEDATION: Versed 1.0 mg IV; Fentanyl 50 mcg IV; Moderate Sedation Time:  20 minutes The patient was continuously monitored during the procedure by the interventional radiology nurse under my direct supervision. FLUOROSCOPY TIME:  Fluoroscopy Time: 48 seconds, 56.3 mGy COMPLICATIONS: None immediate. PROCEDURE: The  procedure was explained to the patient. The risks and benefits of the procedure were discussed and the patient's questions were addressed. Informed consent was obtained from the patient. The patient was placed supine on the interventional table. Ultrasound confirmed a patent right internal jugular vein. Ultrasound image was obtained for documentation. The right neck and chest was prepped and draped in a sterile fashion. The right neck was anesthetized with 1% lidocaine. Maximal barrier sterile technique was utilized including caps, mask, sterile gowns, sterile gloves, sterile drape, hand hygiene and skin antiseptic. A small incision was made with #11 blade scalpel. A 21 gauge needle directed into the right internal jugular vein vein with ultrasound guidance. A micropuncture dilator set was placed. A 23 cm tip to cuff Palindrome catheter was selected. The skin below the right clavicle was anesthetized and a small incision was made with an #11 blade scalpel. A subcutaneous tunnel was formed to the vein dermatotomy site. The catheter was brought through the tunnel. The vein dermatotomy site was dilated to  accommodate a peel-away sheath. The catheter was placed through the peel-away sheath and directed into the central venous structures. The tip of the catheter was placed at the SVC and right atrium junction with fluoroscopy. Fluoroscopic images were obtained for documentation. Both lumens were found to aspirate and flush well. The proper amount of heparin was flushed in both lumens. The vein dermatotomy site was closed using a single layer of absorbable suture and Dermabond. Gel-Foam placed in the subcutaneous tract. The catheter was secured to the skin using Prolene suture. IMPRESSION: Successful placement of a right jugular tunneled dialysis catheter using ultrasound and fluoroscopic guidance. Electronically Signed   By: Markus Daft M.D.   On: 03/12/2019 18:06   IR US Guide Vasc Access Right  Result Date:  03/12/2019 INDICATION: 59 year old with renal failure and starting hemodialysis. Hemodialysis catheter is needed. EXAM: FLUOROSCOPIC AND ULTRASOUND GUIDED PLACEMENT OF A TUNNELED DIALYSIS CATHETER Physician: Stephan Minister. Anselm Pancoast, MD MEDICATIONS: Ancef 2 g; The antibiotic was administered within an appropriate time interval prior to skin puncture. ANESTHESIA/SEDATION: Versed 1.0 mg IV; Fentanyl 50 mcg IV; Moderate Sedation Time:  20 minutes The patient was continuously monitored during the procedure by the interventional radiology nurse under my direct supervision. FLUOROSCOPY TIME:  Fluoroscopy Time: 48 seconds, 14.7 mGy COMPLICATIONS: None immediate. PROCEDURE: The procedure was explained to the patient. The risks and benefits of the procedure were discussed and the patient's questions were addressed. Informed consent was obtained from the patient. The patient was placed supine on the interventional table. Ultrasound confirmed a patent right internal jugular vein. Ultrasound image was obtained for documentation. The right neck and chest was prepped and draped in a sterile fashion. The right neck was anesthetized with 1% lidocaine. Maximal barrier sterile technique was utilized including caps, mask, sterile gowns, sterile gloves, sterile drape, hand hygiene and skin antiseptic. A small incision was made with #11 blade scalpel. A 21 gauge needle directed into the right internal jugular vein vein with ultrasound guidance. A micropuncture dilator set was placed. A 23 cm tip to cuff Palindrome catheter was selected. The skin below the right clavicle was anesthetized and a small incision was made with an #11 blade scalpel. A subcutaneous tunnel was formed to the vein dermatotomy site. The catheter was brought through the tunnel. The vein dermatotomy site was dilated to accommodate a peel-away sheath. The catheter was placed through the peel-away sheath and directed into the central venous structures. The tip of the catheter was  placed at the SVC and right atrium junction with fluoroscopy. Fluoroscopic images were obtained for documentation. Both lumens were found to aspirate and flush well. The proper amount of heparin was flushed in both lumens. The vein dermatotomy site was closed using a single layer of absorbable suture and Dermabond. Gel-Foam placed in the subcutaneous tract. The catheter was secured to the skin using Prolene suture. IMPRESSION: Successful placement of a right jugular tunneled dialysis catheter using ultrasound and fluoroscopic guidance. Electronically Signed   By: Markus Daft M.D.   On: 03/12/2019 18:06   US BIOPSY (KIDNEY)  Result Date: 03/13/2019 INDICATION: Renal failure of uncertain etiology. Please perform ultrasound-guided random renal biopsy for tissue diagnostic purposes. EXAM: ULTRASOUND GUIDED RENAL BIOPSY COMPARISON:  Renal ultrasound-03/10/2019 MEDICATIONS: None. ANESTHESIA/SEDATION: Fentanyl 50 mcg IV; Versed 1 mg IV Total Moderate Sedation time: 10 minutes; The patient was continuously monitored during the procedure by the interventional radiology nurse under my direct supervision. COMPLICATIONS: None immediate. PROCEDURE: Informed written consent was obtained from the patient  after a discussion of the risks, benefits and alternatives to treatment. The patient understands and consents the procedure. A timeout was performed prior to the initiation of the procedure. Ultrasound scanning was performed of the bilateral flanks. The inferior pole of the left kidney was selected for biopsy due to location and sonographic window. The procedure was planned. The operative site was prepped and draped in the usual sterile fashion. The overlying soft tissues were anesthetized with 1% lidocaine with epinephrine. A 17 gauge core needle biopsy device was advanced into the inferior cortex of the left kidney and 3 core biopsies were obtained under direct ultrasound guidance. Images were saved for documentation  purposes. The biopsy device was removed and hemostasis was obtained with manual compression. Post procedural scanning was negative for significant post procedural hemorrhage or additional complication. A dressing was placed. The patient tolerated the procedure well without immediate post procedural complication. IMPRESSION: Technically successful ultrasound guided left renal biopsy. Electronically Signed   By: Sandi Mariscal M.D.   On: 03/13/2019 09:36     LOS: 3 days   Time spent: More than 50% of that time was spent in counseling and/or coordination of care.  Antonieta Pert, MD [facility]  03/14/2019, 9:01 AM

## 2019-03-14 NOTE — TOC Initial Note (Addendum)
Transition of Care [facility]) - Initial/Assessment Note    Patient Details  Name: Gabriel Harris MRN: [id number] Date of Birth: 1960/04/17  Transition of Care [facility]) CM/SW Contact:    Sable Feil, LCSW Phone Number: 03/14/2019, 1:15 PM  Clinical Narrative: Was advised that patient wanted to speak with a case worker regarding having no insurance. Visited with patient and began conversation regarding insurance and was informed by [name] that he has no where to go at discharge. CSW began suggestions with [facility] and [place], however [name] became irritated and stated that he wished he had not come to the hospital. CSW attempted to discuss this statement with him, however patient stated to [place], "I'm through with you". CSW exited room.    Signing off patient does not want to continue conversation with CSW.          Expected Discharge Plan: (Homesless shelter resources discussed with patient) Barriers to Discharge: Other (comment)(Patient is homeless and will be discharged today)   Patient Goals and CMS Choice  Patient is homeless   Choice offered to / list presented to : NA  Expected Discharge Plan and Services Expected Discharge Plan: (Homesless shelter resources discussed with patient) In-house Referral: Clinical Social Work(CSW was informed that patient needed to talk with a case worker)                                            Prior Living Arrangements/Services   Lives with:: Other (Comment)(Patient homeless) Patient language and need for interpreter reviewed:: No Do you feel safe going back to the place where you live?: (Patient is homeless)      Need for Family Participation in Patient Care: No (Comment) Care giver support system in place?: No (comment)   Criminal Activity/Legal Involvement Pertinent to Current Situation/Hospitalization: No - Comment as needed  Activities of Daily Living Home Assistive Devices/Equipment: None ADL  Screening (condition at time of admission) Patient's cognitive ability adequate to safely complete daily activities?: Yes Is the patient deaf or have difficulty hearing?: No Does the patient have difficulty seeing, even when wearing glasses/contacts?: No Does the patient have difficulty concentrating, remembering, or making decisions?: No Patient able to express need for assistance with ADLs?: Yes Does the patient have difficulty dressing or bathing?: No Independently performs ADLs?: Yes (appropriate for developmental age) Does the patient have difficulty walking or climbing stairs?: No Weakness of Legs: None Weakness of Arms/Hands: None  Permission Sought/Granted   Permission granted to share information with : No              Emotional Assessment Appearance:: Appears stated age Attitude/Demeanor/Rapport: Angry(Patient was frustrated as CSW could not assist him with housing)   Orientation: : Oriented to Self, Oriented to Place, Oriented to  Time, Oriented to Situation Alcohol / Substance Use: Tobacco Use, Alcohol Use, Illicit Drugs(Per H&P, patient reports that he quit smoking, has previous alcohol use and does not use illicit drugs) Psych Involvement: No (comment)  Admission diagnosis:  Dehydration [E86.0] Dizziness [R42] Vertigo [R42] AKI (acute kidney injury) ([place]) [N17.9] Patient Active Problem List   Diagnosis Date Noted  . AKI (acute kidney injury) ([place]) 03/10/2019  . Hyperkalemia 03/10/2019  . Normocytic anemia 03/10/2019  . Hypoalbuminemia 03/10/2019  . Sinus bradycardia 03/10/2019  . Tobacco use 03/10/2019  . Vertigo 03/10/2019  . Diplopia 03/10/2019   PCP:  Patient, No Pcp Per Pharmacy:   [facility] [place], [place] - [place] [address] Phone: [phone] Fax: [phone]     Social Determinants of Health (SDOH) Interventions  Patient not agreeable to  initial resources given and ended conversation.    Readmission Risk Interventions No flowsheet data found.

## 2019-03-15 LAB — BASIC METABOLIC PANEL
Anion gap: 11 (ref 5–15)
BUN: 64 mg/dL — ABNORMAL HIGH (ref 6–20)
CO2: 25 mmol/L (ref 22–32)
Calcium: 7.7 mg/dL — ABNORMAL LOW (ref 8.9–10.3)
Chloride: 105 mmol/L (ref 98–111)
Creatinine, Ser: 5.74 mg/dL — ABNORMAL HIGH (ref 0.61–1.24)
GFR calc Af Amer: 12 mL/min — ABNORMAL LOW (ref >60)
GFR calc non Af Amer: 10 mL/min — ABNORMAL LOW (ref >60)
Glucose, Bld: 98 mg/dL (ref 70–99)
Potassium: 4.2 mmol/L (ref 3.5–5.1)
Sodium: 141 mmol/L (ref 135–145)

## 2019-03-15 LAB — CBC
HCT: 27.1 % — ABNORMAL LOW (ref 39.0–52.0)
Hemoglobin: 8.9 g/dL — ABNORMAL LOW (ref 13.0–17.0)
MCH: 28.8 pg (ref 26.0–34.0)
MCHC: 32.8 g/dL (ref 30.0–36.0)
MCV: 87.7 fL (ref 80.0–100.0)
Platelets: 178 10*3/uL (ref 150–400)
RBC: 3.09 MIL/uL — ABNORMAL LOW (ref 4.22–5.81)
RDW: 13.1 % (ref 11.5–15.5)
WBC: 13.3 10*3/uL — ABNORMAL HIGH (ref 4.0–10.5)
nRBC: 0 % (ref 0.0–0.2)

## 2019-03-15 LAB — GLUCOSE, CAPILLARY
Glucose-Capillary: 91 mg/dL (ref 70–99)
Glucose-Capillary: 91 mg/dL (ref 70–99)
Glucose-Capillary: 106 mg/dL — ABNORMAL HIGH (ref 70–99)
Glucose-Capillary: 208 mg/dL — ABNORMAL HIGH (ref 70–99)

## 2019-03-15 LAB — PARATHYROID HORMONE, INTACT (NO CA): PTH: 40 pg/mL (ref 15–65)

## 2019-03-15 NOTE — Progress Notes (Signed)
Subjective:  HD yesterday afternoon for clearance-  No volume removal-  1050 of UOP - labs reflective of HD yest- feels better   Objective Vital signs in last 24 hours: Vitals:   03/14/19 2048 03/15/19 0500 03/15/19 0621 03/15/19 0852  BP: (!) 141/72  (!) 162/89 128/74  Pulse: (!) 50  (!) 50 66  Resp: 18  16 14   Temp: 98 F (36.7 C)  98 F (36.7 C) 98.3 F (36.8 C)  TempSrc: Oral  Oral Oral  SpO2: 96%  96% 94%  Weight: 78 kg 78 kg    Height:   5\' 2"  (1.575 m)    Weight change: 1 kg  Intake/Output Summary (Last 24 hours) at 03/15/2019 1111 Last data filed at 03/15/2019 0900 Gross per 24 hour  Intake 1500 ml  Output 950 ml  Net 550 ml    Assessment/ Plan: Pt is a 59 y.o. yo male homeless smoker who was admitted on 03/10/2019 with N/V and dizziness- found to have a crt of 10- no old data  Assessment/Plan: 1. Renal-  AKI vs CKD.  Had hematuria- work up showed anti MPO markedly positive.  S/p renal biopsy on 3/12-  Got a preliminary report.  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  Felt there was salvageable kidney tissue to attempt treatment- final report pending.   Received bolus steroids for 3 days now on pred 60 dialy.  Started oral cytoxan on 3/10.  Required HD on 3/10 and 3/12.  Nonoliguric.  No needs for HD today -  Follow daily for needs  2. HTN/vol- tank is full-  Doesn't need more fluids.  No BP meds yet-  Has been dizzy - BP variable-  Anxiety is a factor as well  3. Anemia- iron stores OK, will add on ESA    Louis Meckel    Labs: Basic Metabolic Panel: Recent Labs  Lab 03/13/19 0337 03/14/19 0826 03/15/19 0511  NA 140 141 141  K 4.6 4.5 4.2  CL 108 109 105  CO2 20* 20* 25  GLUCOSE 151* 128* 98  BUN 83* 103* 64*  CREATININE 7.79* 8.34* 5.74*  CALCIUM 7.4* 8.0* 7.7*   Liver Function Tests: Recent Labs  Lab 03/10/19 0754 03/11/19 0604  AST 10* 10*  ALT 16 12  ALKPHOS 78 60  BILITOT 0.6  0.3  PROT 7.5 6.4*  ALBUMIN 3.4* 2.9*   No results for input(s): LIPASE, AMYLASE in the last 168 hours. No results for input(s): AMMONIA in the last 168 hours. CBC: Recent Labs  Lab 03/11/19 0604 03/11/19 0604 03/12/19 0803 03/12/19 0803 03/13/19 0337 03/14/19 0826 03/15/19 0511  WBC 10.2   < > 16.8*   < > 14.1* 15.1* 13.3*  HGB 9.2*   < > 9.2*   < > 9.0* 10.0* 8.9*  HCT 29.1*   < > 28.3*   < > 27.5* 31.5* 27.1*  MCV 92.1  --  90.1  --  88.4 91.8 87.7  PLT 203   < > 221   < > 187 211 178   < > = values in this interval not displayed.   Cardiac Enzymes: Recent Labs  Lab 03/10/19 1637  CKTOTAL 45*   CBG: Recent Labs  Lab 03/14/19 0645 03/14/19 1120 03/14/19 1709 03/14/19 2048 03/15/19 0717  GLUCAP 112* 93 117* 113* 91    Iron Studies:  Recent Labs    03/14/19 0826  IRON 120  TIBC 232*  FERRITIN 233   Studies/Results: No results found. Medications: Infusions: . sodium chloride 75 mL/hr at 03/14/19 0851    Scheduled Medications: . Chlorhexidine Gluconate Cloth  6 each Topical Q0600  . Chlorhexidine Gluconate Cloth  6 each Topical Q0600  . cyclophosphamide  125 mg Oral Daily  . insulin aspart  0-9 Units Subcutaneous TID WC  . predniSONE  60 mg Oral Q breakfast  . sulfamethoxazole-trimethoprim  1 tablet Oral Daily  . traZODone  50 mg Oral Once    have reviewed scheduled and prn medications.  Physical Exam: General: NAD Heart: RRR Lungs: mostly clear Abdomen: soft, non tender Extremities: no edema Dialysis Access: TDC     03/15/2019,11:11 AM  LOS: 4 days

## 2019-03-15 NOTE — Progress Notes (Signed)
PROGRESS NOTE    Gabriel Harris  XVQ:[id number] DOB: Sep 20, 1960 DOA: 03/10/2019 PCP: Patient, No Pcp Per   Brief Narrative: Per HPI: 59 y.o.malewithno significant past medical history except for tobacco use disorder who presented to the [facility] ED with complaints of 2 weeks of worsening dizziness, nausea, tinnitus, diplopia, spinning sensation, associated with 3 days of diarrhea and at least 5 episodes of emesis. In the ED, MRI negative for any intracranial findings but was found to have significantly elevated creatinine greater than 10.  Was started on IV fluid and nephrology was consulted.  Work-up showed concern for possible GN.  Unfortunately creatinine continues to trend up at >11 with oliguria.  Nephrology recommended transfer to [facility] for possible hemodialysis.  3/10 IR has been consulted for placement of line for HD. 3/11-had renal biopsy. 3/12- HD session.  Subjective: Seen this morning. Resting in bed denies any complaint.  Reports she is having good bowel movements and feels well.  Some dizziness present. Heart rate in 50s, saturating 90% room air, blood pressure 130s to 160s.  Afebrile.  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  Sediment suggestive of glomerulonephritis, renal ultrasound without hydronephrosis and creatinine did not improve with hydration.  Status post right IJ HD catheter 3/10 and HD first 3/10, 2 hrs only tolerated. S/p renal biopsy 3/11 and initiated on prednisone 60 mg daily, cyclophosphamide 125 mg daily and Bactrim 1 tablet daily for vasculitis. BUN creatinine continues to worsen-patient started on dialysis, nephrology following closely. renal biopsy report pending.   Recent Labs  Lab 03/11/19 0604 03/12/19 0803 03/13/19 0337 03/14/19 0826 03/15/19 0511  BUN 91* 98* 83* 103* 64*  CREATININE 11.19* 10.64* 7.79* 8.34* 5.74*    Hyperkalemia : Improved with Lokelma and dialysis.    Hypertension blood pressure fairly stable.  Monitor and if remains high despite dialysis sessions can start oral meds  Vertigo/Diplopia: Unclear etiology MRI brain no acute finding.  Likely in the setting of uremia severe AKI.  Symptoms improving.  Continue conservative management continue dialysis treatment as #1.encourage ambulation.   Normocytic anemia: Stable 9 g range..  In the setting of renal failure.  Monitor.  Leukocytosis-? Cause likely reactive/steroid. No fever.  Monitor. Recent Labs  Lab 03/11/19 0604 03/12/19 0803 03/13/19 0337 03/14/19 0826 03/15/19 0511  WBC 10.2 16.8* 14.1* 15.1* 13.3*    Sinus bradycardia-stable.  Tobacco use: Cessation advised.  DVT prophylaxis:SCD Code Status:FULL Family Communication: plan of care discussed with patient at bedside. Disposition Plan: Patient is from:HOMELESS Anticipated Disposition: TBD/homeless shelter? Barriers to discharge or conditions that needs to be met prior to discharge: patient admitted with severe acute renal failure/hyperkalemia- needing initiation of dialysis, renal biopsy and pending further stabilization signing off by nephrology.  Will be difficult disposition given lack of insurance and homelessness.Remains in the hospital.  Social worker following as patient is homeless and uninsured. Discussed with nephrologist.  Consultants: NEPHRO Procedures:see note Microbiology:see note  Medications: Scheduled Meds: . Chlorhexidine Gluconate Cloth  6 each Topical Q0600  . Chlorhexidine Gluconate Cloth  6 each Topical Q0600  . cyclophosphamide  125 mg Oral Daily  . insulin aspart  0-9 Units Subcutaneous TID WC  . predniSONE  60 mg Oral Q breakfast  . sulfamethoxazole-trimethoprim  1 tablet Oral Daily  . traZODone  50 mg Oral Once   Continuous Infusions: . sodium chloride 75 mL/hr at 03/14/19 [phone]  Antimicrobials: Anti-infectives (From admission,  onward)   Start     Dose/Rate Route Frequency Ordered Stop   03/12/19 1536  ceFAZolin (ANCEF) 2-4 GM/100ML-% IVPB    Note to Pharmacy: Rodney Booze   : cabinet override      03/12/19 1536 03/12/19 1606   03/12/19 1300  sulfamethoxazole-trimethoprim (BACTRIM) 400-80 MG per tablet 1 tablet     1 tablet Oral Daily 03/12/19 1259     03/12/19 0800  ceFAZolin (ANCEF) IVPB 2g/100 mL premix     2 g 200 mL/hr over 30 Minutes Intravenous  Once 03/11/19 1615 03/12/19 1610       Objective: Vitals: Today's Vitals   03/14/19 1930 03/14/19 2048 03/15/19 0500 03/15/19 0621  BP:  (!) 141/72  (!) 162/89  Pulse:  (!) 50  (!) 50  Resp:  18  16  Temp:  98 F (36.7 C)  98 F (36.7 C)  TempSrc:  Oral  Oral  SpO2:  96%  96%  Weight:  78 kg 78 kg   Height:    _0  (1.575 m)  PainSc: 0-No pain   0-No pain    Intake/Output Summary (Last 24 hours) at 03/15/2019 0741 Last data filed at 03/15/2019 0625 Gross per 24 hour  Intake 1820.89 ml  Output 1050 ml  Net 770.89 ml   Filed Weights   03/14/19 1613 03/14/19 2048 03/15/19 0500  Weight: 78.4 kg 78 kg 78 kg   Weight change: 1 kg   Intake/Output from previous day: 03/12 0701 - 03/13 0700 In: 1820.9 [P.O.:720; I.V.:1100.9] Out: 1050 [Urine:1050] Intake/Output this shift: No intake/output data recorded.  Examination:  General exam: Alert awake oriented x3, not in acute distress. HEENT:Oral mucosa moist, Ear/Nose WNL grossly,dentition normal. Respiratory system: bilaterally clear breath sounds, no wheezing or crackles, no use of accessory muscles.   Cardiovascular system: S1 & S2 +, regular, No JVD. Gastrointestinal system: Abdomen soft, NT,ND, BS+. Nervous System:Alert, awake, moving extremities and grossly nonfocal. Extremities: No edema, distal peripheral pulses palpable.  Skin: No rashes,no icterus. MSK: Normal muscle bulk,tone, power. Right IJ catheter present.   Data Reviewed: I have personally reviewed following labs and imaging  studies CBC: Recent Labs  Lab 03/11/19 0604 03/12/19 0803 03/13/19 0337 03/14/19 0826 03/15/19 0511  WBC 10.2 16.8* 14.1* 15.1* 13.3*  HGB 9.2* 9.2* 9.0* 10.0* 8.9*  HCT 29.1* 28.3* 27.5* 31.5* 27.1*  MCV 92.1 90.1 88.4 91.8 87.7  PLT 203 221 187 211 400   Basic Metabolic Panel: Recent Labs  Lab 03/11/19 0604 03/12/19 0803 03/13/19 0337 03/14/19 0826 03/15/19 0511  NA 139 141 140 141 141  K 5.8* 5.6* 4.6 4.5 4.2  CL 115* 111 108 109 105  CO2 15* 17* 20* 20* 25  GLUCOSE 140* 127* 151* 128* 98  BUN 91* 98* 83* 103* 64*  CREATININE 11.19* 10.64* 7.79* 8.34* 5.74*  CALCIUM 8.5* 8.5* 7.4* 8.0* 7.7*   GFR: Estimated Creatinine Clearance: 12.7 mL/min (A) (by C-G formula based on SCr of 5.74 mg/dL (H)). Liver Function Tests: Recent Labs  Lab 03/10/19 0754 03/11/19 0604  AST 10* 10*  ALT 16 12  ALKPHOS 78 60  BILITOT 0.6 0.3  PROT 7.5 6.4*  ALBUMIN 3.4* 2.9*   No results for input(s): LIPASE, AMYLASE in the last 168 hours. No results for input(s): AMMONIA in the last 168 hours. Coagulation Profile: Recent Labs  Lab 03/12/19 0838  INR 1.1   Cardiac Enzymes: Recent Labs  Lab 03/10/19 1637  CKTOTAL 45*  BNP (last 3 results) No results for input(s): PROBNP in the last 8760 hours. HbA1C: No results for input(s): HGBA1C in the last 72 hours. CBG: Recent Labs  Lab 03/14/19 0645 03/14/19 1120 03/14/19 1709 03/14/19 2048 03/15/19 0717  GLUCAP 112* 93 117* 113* 91   Lipid Profile: No results for input(s): CHOL, HDL, LDLCALC, TRIG, CHOLHDL, LDLDIRECT in the last 72 hours. Thyroid Function Tests: No results for input(s): TSH, T4TOTAL, FREET4, T3FREE, THYROIDAB in the last 72 hours. Anemia Panel: Recent Labs    03/14/19 0826  FERRITIN 233  TIBC 232*  IRON 120   Sepsis Labs: No results for input(s): PROCALCITON, LATICACIDVEN in the last 168 hours.  Recent Results (from the past 240 hour(s))  SARS CORONAVIRUS 2 (TAT 6-24 HRS) Nasopharyngeal  Nasopharyngeal Swab     Status: None   Collection Time: 03/10/19  9:42 AM   Specimen: Nasopharyngeal Swab  Result Value Ref Range Status   SARS Coronavirus 2 NEGATIVE NEGATIVE Final    Comment: (NOTE) SARS-CoV-2 target nucleic acids are NOT DETECTED. The SARS-CoV-2 RNA is generally detectable in upper and lower respiratory specimens during the acute phase of infection. Negative results do not preclude SARS-CoV-2 infection, do not rule out co-infections with other pathogens, and should not be used as the sole basis for treatment or other patient management decisions. Negative results must be combined with clinical observations, patient history, and epidemiological information. The expected result is Negative. Fact Sheet for Patients: SugarRoll.be Fact Sheet for Healthcare Providers: https://www.woods-mathews.com/ This test is not yet approved or cleared by the [place] FDA and  has been authorized for detection and/or diagnosis of SARS-CoV-2 by FDA under an Emergency Use Authorization (EUA). This EUA will remain  in effect (meaning this test can be used) for the duration of the COVID-19 declaration under Section 56 4(b)(1) of the Act, 21 U.S.C. section 360bbb-3(b)(1), unless the authorization is terminated or revoked sooner. Performed at [facility], [place] [address] [place], [place] [address]       Radiology Studies: US BIOPSY (KIDNEY)  Result Date: 03/13/2019 INDICATION: Renal failure of uncertain etiology. Please perform ultrasound-guided random renal biopsy for tissue diagnostic purposes. EXAM: ULTRASOUND GUIDED RENAL BIOPSY COMPARISON:  Renal ultrasound-03/10/2019 MEDICATIONS: None. ANESTHESIA/SEDATION: Fentanyl 50 mcg IV; Versed 1 mg IV Total Moderate Sedation time: 10 minutes; The patient was continuously monitored during the procedure by the interventional radiology nurse under my direct supervision. COMPLICATIONS: None  immediate. PROCEDURE: Informed written consent was obtained from the patient after a discussion of the risks, benefits and alternatives to treatment. The patient understands and consents the procedure. A timeout was performed prior to the initiation of the procedure. Ultrasound scanning was performed of the bilateral flanks. The inferior pole of the left kidney was selected for biopsy due to location and sonographic window. The procedure was planned. The operative site was prepped and draped in the usual sterile fashion. The overlying soft tissues were anesthetized with 1% lidocaine with epinephrine. A 17 gauge core needle biopsy device was advanced into the inferior cortex of the left kidney and 3 core biopsies were obtained under direct ultrasound guidance. Images were saved for documentation purposes. The biopsy device was removed and hemostasis was obtained with manual compression. Post procedural scanning was negative for significant post procedural hemorrhage or additional complication. A dressing was placed. The patient tolerated the procedure well without immediate post procedural complication. IMPRESSION: Technically successful ultrasound guided left renal biopsy. Electronically Signed   By: Eldridge Abrahams.D.  On: 03/13/2019 09:36     LOS: 4 days   Time spent: More than 50% of that time was spent in counseling and/or coordination of care.  Antonieta Pert, MD [facility]  03/15/2019, 7:41 AM

## 2019-03-16 LAB — GLUCOSE, CAPILLARY
Glucose-Capillary: 87 mg/dL (ref 70–99)
Glucose-Capillary: 78 mg/dL (ref 70–99)
Glucose-Capillary: 154 mg/dL — ABNORMAL HIGH (ref 70–99)
Glucose-Capillary: 166 mg/dL — ABNORMAL HIGH (ref 70–99)

## 2019-03-16 LAB — RENAL FUNCTION PANEL
Albumin: 2.3 g/dL — ABNORMAL LOW (ref 3.5–5.0)
Anion gap: 11 (ref 5–15)
BUN: 82 mg/dL — ABNORMAL HIGH (ref 6–20)
CO2: 23 mmol/L (ref 22–32)
Calcium: 7.4 mg/dL — ABNORMAL LOW (ref 8.9–10.3)
Chloride: 107 mmol/L (ref 98–111)
Creatinine, Ser: 6.49 mg/dL — ABNORMAL HIGH (ref 0.61–1.24)
GFR calc Af Amer: 10 mL/min — ABNORMAL LOW (ref >60)
GFR calc non Af Amer: 9 mL/min — ABNORMAL LOW (ref >60)
Glucose, Bld: 86 mg/dL (ref 70–99)
Phosphorus: 5.5 mg/dL — ABNORMAL HIGH (ref 2.5–4.6)
Potassium: 3.7 mmol/L (ref 3.5–5.1)
Sodium: 141 mmol/L (ref 135–145)

## 2019-03-16 MED ORDER — DARBEPOETIN ALFA 100 MCG/0.5ML IJ SOSY
100.0000 ug | PREFILLED_SYRINGE | INTRAMUSCULAR | Status: DC
  Administered 2019-03-16 – 2019-03-30 (×3): 100 ug via SUBCUTANEOUS
  Filled 2019-03-16 (×4): qty 0.5

## 2019-03-16 NOTE — Progress Notes (Signed)
Subjective:   925 of UOP - no labs resulted this AM  Objective Vital signs in last 24 hours: Vitals:   03/15/19 0852 03/15/19 1756 03/15/19 2059 03/16/19 0530  BP: 128/74 (!) 143/86 (!) 141/86 (!) 142/79  Pulse: 66 73 65 (!) 56  Resp: 14 16 18 18   Temp: 98.3 F (36.8 C) 98.8 F (37.1 C) 98.6 F (37 C) 98.9 F (37.2 C)  TempSrc: Oral Oral Oral Oral  SpO2: 94% 97% 94% 95%  Weight:   78 kg   Height:       Weight change: -0.989 kg  Intake/Output Summary (Last 24 hours) at 03/16/2019 1044 Last data filed at 03/16/2019 0530 Gross per 24 hour  Intake 720 ml  Output 725 ml  Net -5 ml    Assessment/ Plan: Pt is a 59 y.o. yo male homeless smoker who was admitted on 03/10/2019 with N/V and dizziness- found to have a crt of 10- no old data  Assessment/Plan: 1. Renal-  AKI vs CKD.  Had hematuria- work up showed anti MPO markedly positive.  S/p renal biopsy on 3/12-  Got a preliminary report.  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  Felt there was salvageable kidney tissue to attempt treatment- final report pending.   Received bolus steroids for 3 days now on pred 60 dialy.  Started oral cytoxan 125 mg daily on 3/10.  Required HD on 3/10 and 3/12.  Nonoliguric.  No labs today, will order for today and daily -  Follow daily for HD needs  2. HTN/vol- tank is full-  Doesn't need more fluids.  No BP meds yet-  Has been dizzy - BP variable-  Anxiety is a factor as well - no change 3. Anemia- iron stores OK, will add on ESA    Gabriel Harris    Labs: Basic Metabolic Panel: Recent Labs  Lab 03/13/19 0337 03/14/19 0826 03/15/19 0511  NA 140 141 141  K 4.6 4.5 4.2  CL 108 109 105  CO2 20* 20* 25  GLUCOSE 151* 128* 98  BUN 83* 103* 64*  CREATININE 7.79* 8.34* 5.74*  CALCIUM 7.4* 8.0* 7.7*   Liver Function Tests: Recent Labs  Lab 03/10/19 0754 03/11/19 0604  AST 10* 10*  ALT 16 12  ALKPHOS 78 60  BILITOT 0.6 0.3   PROT 7.5 6.4*  ALBUMIN 3.4* 2.9*   No results for input(s): LIPASE, AMYLASE in the last 168 hours. No results for input(s): AMMONIA in the last 168 hours. CBC: Recent Labs  Lab 03/11/19 0604 03/11/19 0604 03/12/19 0803 03/12/19 0803 03/13/19 0337 03/14/19 0826 03/15/19 0511  WBC 10.2   < > 16.8*   < > 14.1* 15.1* 13.3*  HGB 9.2*   < > 9.2*   < > 9.0* 10.0* 8.9*  HCT 29.1*   < > 28.3*   < > 27.5* 31.5* 27.1*  MCV 92.1  --  90.1  --  88.4 91.8 87.7  PLT 203   < > 221   < > 187 211 178   < > = values in this interval not displayed.   Cardiac Enzymes: Recent Labs  Lab 03/10/19 1637  CKTOTAL 45*   CBG: Recent Labs  Lab 03/15/19 0717 03/15/19 1119 03/15/19 1659 03/15/19 2059 03/16/19 0715  GLUCAP 91 91 106* 208* 87    Iron Studies:  Recent Labs    03/14/19 0826  IRON 120  TIBC 232*  FERRITIN 233  Studies/Results: No results found. Medications: Infusions:   Scheduled Medications: . Chlorhexidine Gluconate Cloth  6 each Topical Q0600  . Chlorhexidine Gluconate Cloth  6 each Topical Q0600  . cyclophosphamide  125 mg Oral Daily  . insulin aspart  0-9 Units Subcutaneous TID WC  . predniSONE  60 mg Oral Q breakfast  . sulfamethoxazole-trimethoprim  1 tablet Oral Daily  . traZODone  50 mg Oral Once    have reviewed scheduled and prn medications.  Physical Exam: General: NAD Heart: RRR Lungs: mostly clear Abdomen: soft, non tender Extremities: no edema Dialysis Access: TDC     03/16/2019,10:44 AM  LOS: 5 days

## 2019-03-16 NOTE — Progress Notes (Signed)
PROGRESS NOTE    Chaden Doom  QVZ:[id number] DOB: May 13, 1960 DOA: 03/10/2019 PCP: Patient, No Pcp Per   Brief Narrative: Per HPI: 59 y.o.malewithno significant past medical history except for tobacco use disorder who presented to the [facility] ED with complaints of 2 weeks of worsening dizziness, nausea, tinnitus, diplopia, spinning sensation, associated with 3 days of diarrhea and at least 5 episodes of emesis. In the ED, MRI negative for any intracranial findings but was found to have significantly elevated creatinine greater than 10.  Was started on IV fluid and nephrology was consulted.  Work-up showed concern for possible GN.  Unfortunately creatinine continues to trend up at >11 with oliguria.  Nephrology recommended transfer to [facility] for possible hemodialysis.  3/10 IR has been consulted for placement of line for HD. 3/11-had renal biopsy. 3/12- HD session for clearance no volume removal.  Subjective:  C/o Some back pain Tele wires not letting him sleep- wants them out. No episode of fever overnight.  Blood pressure is stable, blood sugar ranging 87-208  Assessment & Plan:  Severe AKI with oliguria  Vs CKD with metabolic acidosis: Had hematuria and work-up showed anti-MPO markedly positive,no significant prior medical problems, hba1c 5.8, positive smoker.  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 S/p bolus days for 30 days and now on prednisone 60 mg, cyclophosphamide 125 mg daily and Bactrim 1 tablet 3/10. Started on HD w/  right IJ HD catheter- HD 3/10, 3/12.  Cont current plan as per nephro Recent Labs  Lab 03/11/19 0604 03/12/19 0803 03/13/19 0337 03/14/19 0826 03/15/19 0511  BUN 91* 98* 83* 103* 64*  CREATININE 11.19* 10.64* 7.79* 8.34* 5.74*   Hyperkalemia : Improved with Lokelma and dialysis.    Hypertension blood pressure fairly stable.  Monitor and if remains  high despite dialysis sessions can start oral meds  Vertigo/Diplopia: Unclear etiology MRI brain no acute finding.  Likely in the setting of uremia severe AKI.  Symptoms improving.  Continue conservative management continue dialysis treatment as #1.encourage ambulation.   Normocytic anemia: Stable 9 g range. In the setting of renal failure.  Monitor.  Leukocytosis-? Cause likely reactive/steroid. No fever.  Monitor. Recent Labs  Lab 03/11/19 0604 03/12/19 0803 03/13/19 0337 03/14/19 0826 03/15/19 0511  WBC 10.2 16.8* 14.1* 15.1* 13.3*   Sinus bradycardia-stable.  Tobacco use: Cessation advised.  DVT prophylaxis:SCD Code Status:FULL Family Communication: plan of care discussed with patient at bedside. Disposition Plan: Patient is from:HOMELESS Anticipated Disposition: TBD/homeless shelter? Barriers to discharge or conditions that needs to be met prior to discharge: patient admitted with severe acute renal failure/hyperkalemia- needing initiation of dialysis, renal biopsy and pending further stabilization signing off by nephrology.  Will be difficult disposition given lack of insurance and homelessness.Remains in the hospital.  Social worker following as patient is homeless and uninsured. Discussed with nephrologist.  Consultants: NEPHRO Procedures:see note Microbiology:see note  Medications: Scheduled Meds: . Chlorhexidine Gluconate Cloth  6 each Topical Q0600  . Chlorhexidine Gluconate Cloth  6 each Topical Q0600  . cyclophosphamide  125 mg Oral Daily  . insulin aspart  0-9 Units Subcutaneous TID WC  . predniSONE  60 mg Oral Q breakfast  . sulfamethoxazole-trimethoprim  1 tablet Oral Daily  . traZODone  50 mg Oral Once   Continuous Infusions:   Antimicrobials: Anti-infectives (From admission, onward)   Start     Dose/Rate Route Frequency Ordered Stop   03/12/19 1536  ceFAZolin (ANCEF) 2-4 GM/100ML-% IVPB    Note to Pharmacy: Rodney Booze   : cabinet override       03/12/19 1536 03/12/19 1606   03/12/19 1300  sulfamethoxazole-trimethoprim (BACTRIM) 400-80 MG per tablet 1 tablet     1 tablet Oral Daily 03/12/19 1259     03/12/19 0800  ceFAZolin (ANCEF) IVPB 2g/100 mL premix     2 g 200 mL/hr over 30 Minutes Intravenous  Once 03/11/19 1615 03/12/19 1610       Objective: Vitals: Today's Vitals   03/15/19 1756 03/15/19 2030 03/15/19 2059 03/16/19 0530  BP: (!) 143/86  (!) 141/86 (!) 142/79  Pulse: 73  65 (!) 56  Resp: 16  18 18   Temp: 98.8 F (37.1 C)  98.6 F (37 C) 98.9 F (37.2 C)  TempSrc: Oral  Oral Oral  SpO2: 97%  94% 95%  Weight:   78 kg   Height:      PainSc:  0-No pain      Intake/Output Summary (Last 24 hours) at 03/16/2019 0746 Last data filed at 03/16/2019 0530 Gross per 24 hour  Intake 720 ml  Output 925 ml  Net -205 ml   Filed Weights   03/14/19 2048 03/15/19 0500 03/15/19 2059  Weight: 78 kg 78 kg 78 kg   Weight change: -0.989 kg   Intake/Output from previous day: 03/13 0701 - 03/14 0700 In: 720 [P.O.:720] Out: 925 [Urine:925] Intake/Output this shift: No intake/output data recorded.  Examination:  General exam: Alert awake oriented x3, NAD. ON RA HEENT:Oral mucosa moist, Ear/Nose WNL grossly,dentition normal. Respiratory system: B/L CLEAR no wheezing or crackles, no use of accessory muscles.   Cardiovascular system: S1 & S2 +, regular, No JVD. Gastrointestinal system: Abdomen soft, NT,ND, BS+. Nervous System:Alert, awake, moving extremities and grossly nonfocal. Extremities: No edema, distal peripheral pulses palpable.  Skin: No rashes,no icterus. MSK: Normal muscle bulk,tone, power. Rt Ij HD Cath+   Data Reviewed: I have personally reviewed following labs and imaging studies CBC: Recent Labs  Lab 03/11/19 0604 03/12/19 0803 03/13/19 0337 03/14/19 0826 03/15/19 0511  WBC 10.2 16.8* 14.1* 15.1* 13.3*  HGB 9.2* 9.2* 9.0* 10.0* 8.9*  HCT 29.1* 28.3* 27.5* 31.5* 27.1*  MCV 92.1 90.1 88.4 91.8 87.7   PLT 203 221 187 211 811   Basic Metabolic Panel: Recent Labs  Lab 03/11/19 0604 03/12/19 0803 03/13/19 0337 03/14/19 0826 03/15/19 0511  NA 139 141 140 141 141  K 5.8* 5.6* 4.6 4.5 4.2  CL 115* 111 108 109 105  CO2 15* 17* 20* 20* 25  GLUCOSE 140* 127* 151* 128* 98  BUN 91* 98* 83* 103* 64*  CREATININE 11.19* 10.64* 7.79* 8.34* 5.74*  CALCIUM 8.5* 8.5* 7.4* 8.0* 7.7*   GFR: Estimated Creatinine Clearance: 12.7 mL/min (A) (by C-G formula based on SCr of 5.74 mg/dL (H)). Liver Function Tests: Recent Labs  Lab 03/10/19 0754 03/11/19 0604  AST 10* 10*  ALT 16 12  ALKPHOS 78 60  BILITOT 0.6 0.3  PROT 7.5 6.4*  ALBUMIN 3.4* 2.9*   No results for input(s): LIPASE, AMYLASE in the last 168 hours. No results for input(s): AMMONIA in the last 168 hours. Coagulation Profile: Recent Labs  Lab 03/12/19 0838  INR 1.1   Cardiac Enzymes: Recent Labs  Lab 03/10/19 1637  CKTOTAL 45*   BNP (last 3 results) No results for input(s): PROBNP in the last 8760 hours. HbA1C: No results for input(s): HGBA1C in the last 72 hours. CBG:  Recent Labs  Lab 03/15/19 0717 03/15/19 1119 03/15/19 1659 03/15/19 2059 03/16/19 0715  GLUCAP 91 91 106* 208* 87   Lipid Profile: No results for input(s): CHOL, HDL, LDLCALC, TRIG, CHOLHDL, LDLDIRECT in the last 72 hours. Thyroid Function Tests: No results for input(s): TSH, T4TOTAL, FREET4, T3FREE, THYROIDAB in the last 72 hours. Anemia Panel: Recent Labs    03/14/19 0826  FERRITIN 233  TIBC 232*  IRON 120   Sepsis Labs: No results for input(s): PROCALCITON, LATICACIDVEN in the last 168 hours.  Recent Results (from the past 240 hour(s))  SARS CORONAVIRUS 2 (TAT 6-24 HRS) Nasopharyngeal Nasopharyngeal Swab     Status: None   Collection Time: 03/10/19  9:42 AM   Specimen: Nasopharyngeal Swab  Result Value Ref Range Status   SARS Coronavirus 2 NEGATIVE NEGATIVE Final    Comment: (NOTE) SARS-CoV-2 target nucleic acids are NOT  DETECTED. The SARS-CoV-2 RNA is generally detectable in upper and lower respiratory specimens during the acute phase of infection. Negative results do not preclude SARS-CoV-2 infection, do not rule out co-infections with other pathogens, and should not be used as the sole basis for treatment or other patient management decisions. Negative results must be combined with clinical observations, patient history, and epidemiological information. The expected result is Negative. Fact Sheet for Patients: SugarRoll.be Fact Sheet for Healthcare Providers: https://www.woods-mathews.com/ This test is not yet approved or cleared by the [place] FDA and  has been authorized for detection and/or diagnosis of SARS-CoV-2 by FDA under an Emergency Use Authorization (EUA). This EUA will remain  in effect (meaning this test can be used) for the duration of the COVID-19 declaration under Section 56 4(b)(1) of the Act, 21 U.S.C. section 360bbb-3(b)(1), unless the authorization is terminated or revoked sooner. Performed at [facility], [place] [address] [place], [place] [address]       Radiology Studies: No results found.   LOS: 5 days   Time spent: More than 50% of that time was spent in counseling and/or coordination of care.  Antonieta Pert, MD [facility]  03/16/2019, 7:46 AM

## 2019-03-17 LAB — RENAL FUNCTION PANEL
Albumin: 2.1 g/dL — ABNORMAL LOW (ref 3.5–5.0)
Anion gap: 11 (ref 5–15)
BUN: 90 mg/dL — ABNORMAL HIGH (ref 6–20)
CO2: 22 mmol/L (ref 22–32)
Calcium: 7.4 mg/dL — ABNORMAL LOW (ref 8.9–10.3)
Chloride: 108 mmol/L (ref 98–111)
Creatinine, Ser: 6.97 mg/dL — ABNORMAL HIGH (ref 0.61–1.24)
GFR calc Af Amer: 9 mL/min — ABNORMAL LOW (ref >60)
GFR calc non Af Amer: 8 mL/min — ABNORMAL LOW (ref >60)
Glucose, Bld: 102 mg/dL — ABNORMAL HIGH (ref 70–99)
Phosphorus: 6.1 mg/dL — ABNORMAL HIGH (ref 2.5–4.6)
Potassium: 3.9 mmol/L (ref 3.5–5.1)
Sodium: 141 mmol/L (ref 135–145)

## 2019-03-17 LAB — GLUCOSE, CAPILLARY
Glucose-Capillary: 84 mg/dL (ref 70–99)
Glucose-Capillary: 93 mg/dL (ref 70–99)
Glucose-Capillary: 136 mg/dL — ABNORMAL HIGH (ref 70–99)
Glucose-Capillary: 123 mg/dL — ABNORMAL HIGH (ref 70–99)

## 2019-03-17 MED ORDER — HEPARIN SODIUM (PORCINE) 5000 UNIT/ML IJ SOLN
5000.0000 [IU] | Freq: Three times a day (TID) | INTRAMUSCULAR | Status: DC
  Administered 2019-03-17 – 2019-04-02 (×46): 5000 [IU] via SUBCUTANEOUS
  Filled 2019-03-17 (×43): qty 1

## 2019-03-17 NOTE — Progress Notes (Signed)
  [facility] Progress Note    Assessment/ Plan:   Pt is a 59 y.o. yo male homeless smoker who was admitted on 03/10/2019 with N/V and dizziness- found to have a crt of 10- no old data   Assessment/Plan: 1. Renal-  AKI vs CKD.  Had hematuria- work up showed anti MPO markedly positive.  S/p renal biopsy on 3/12-  Got a preliminary report.  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  Felt there was salvageable kidney tissue to attempt treatment- final report pending.   Received bolus steroids for 3 days now on pred 60 daily.  Started oral cytoxan 125 mg daily on 3/10.  Required HD on 3/10 and 3/12.  Nonoliguric.  no HD needed today, will reassess tomorrow for HD needs.  Hep and HIV negative, getting quantiferon gold   2. HTN/vol- tank is full-  Doesn't need more fluids.  No BP meds yet-  Has been dizzy - BP variable-  Anxiety is a factor as well - no change 3. Anemia- iron stores OK, will add on ESA  Subjective:    UOP good, Cr still uptrending.  Updated pt at bedside and sister over the phone   Objective:   BP 140/80 (BP Location: Right Arm)   Pulse (!) 59   Temp 97.9 F (36.6 C) (Oral)   Resp 18   Ht 5\' 2"  (1.575 m)   Wt 78.1 kg   SpO2 98%   BMI 31.49 kg/m   Intake/Output Summary (Last 24 hours) at 03/17/2019 1141 Last data filed at 03/17/2019 0943 Gross per 24 hour  Intake 1260 ml  Output 800 ml  Net 460 ml   Weight change: 0.089 kg  Physical Exam: Gen: NAD, sitting in bed CVS: RRR no m/r/g Resp: bilateral wheezing Abd: soft nontender Ext: no LE edema ACCESS: R IJ TDC  Imaging: No results found.  Labs: BMET Recent Labs  Lab 03/11/19 0604 03/12/19 0803 03/13/19 0337 03/14/19 0826 03/15/19 0511 03/16/19 1056 03/17/19 0410  NA 139 141 140 141 141 141 141  K 5.8* 5.6* 4.6 4.5 4.2 3.7 3.9  CL 115* 111 108 109 105 107 108  CO2 15* 17* 20* 20* 25 23 22   GLUCOSE 140* 127* 151* 128* 98 86 102*   BUN 91* 98* 83* 103* 64* 82* 90*  CREATININE 11.19* 10.64* 7.79* 8.34* 5.74* 6.49* 6.97*  CALCIUM 8.5* 8.5* 7.4* 8.0* 7.7* 7.4* 7.4*  PHOS  --   --   --   --   --  5.5* 6.1*   CBC Recent Labs  Lab 03/12/19 0803 03/13/19 0337 03/14/19 0826 03/15/19 0511  WBC 16.8* 14.1* 15.1* 13.3*  HGB 9.2* 9.0* 10.0* 8.9*  HCT 28.3* 27.5* 31.5* 27.1*  MCV 90.1 88.4 91.8 87.7  PLT 221 187 211 178    Medications:    . Chlorhexidine Gluconate Cloth  6 each Topical Q0600  . Chlorhexidine Gluconate Cloth  6 each Topical Q0600  . cyclophosphamide  125 mg Oral Daily  . darbepoetin (ARANESP) injection - NON-DIALYSIS  100 mcg Subcutaneous Q Sun-1800  . insulin aspart  0-9 Units Subcutaneous TID WC  . predniSONE  60 mg Oral Q breakfast  . sulfamethoxazole-trimethoprim  1 tablet Oral Daily  . traZODone  50 mg Oral Once      Madelon Lips, MD 03/17/2019, 11:41 AM

## 2019-03-17 NOTE — Plan of Care (Signed)
  Problem: Health Behavior/Discharge Planning: Goal: Ability to manage health-related needs will improve Outcome: Progressing   

## 2019-03-17 NOTE — Progress Notes (Signed)
PROGRESS NOTE    Jefferson Fullam  TGY:[id number] DOB: 08/29/1960 DOA: 03/10/2019 PCP: Patient, No Pcp Per   Brief Narrative: Per HPI: 59 y.o.malewithno significant past medical history except for tobacco use disorder who presented to the [facility] ED with complaints of 2 weeks of worsening dizziness, nausea, tinnitus, diplopia, spinning sensation, associated with 3 days of diarrhea and at least 5 episodes of emesis. In the ED, MRI negative for any intracranial findings but was found to have significantly elevated creatinine greater than 10.  Was started on IV fluid and nephrology was consulted.  Work-up showed concern for possible GN.  Unfortunately creatinine continues to trend up at >11 with oliguria.  Nephrology recommended transfer to [facility] for possible hemodialysis.  3/10 IR has been consulted for placement of line for HD. 3/11-had renal biopsy. 3/12- HD session for clearance no volume removal.  Subjective:  Denies any new complaints.  No nausea vomiting. No acute events overnight.  Afebrile T-max 98, doing well on room air.  Labs  K 3.9, BUN/creatinine 90/6.9 7 Blood pressure 130s to 160s  Assessment & Plan:  Severe AKI with oliguria  Vs CKD with metabolic acidosis: Had hematuria and work-up showed anti-MPO markedly positive,no significant prior medical problems, hba1c 5.8, positive smoker.  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 S/p bolus steroid and now on prednisone 60 mg, cyclophosphamide 125 mg daily and Bactrim 1 tablet 3/10. Started on HD w/  right IJ HD catheter- HD 3/10, 3/12.  Urine output 1000 ml/24 hr. Cont current plan as per nephro-discussed with Dr. Hollie Salk this morning Recent Labs  Lab 03/13/19 0337 03/14/19 0826 03/15/19 0511 03/16/19 1056 03/17/19 0410  BUN 83* 103* 64* 82* 90*  CREATININE 7.79* 8.34* 5.74* 6.49* 6.97*   Hyperkalemia : Improved with Lokelma  and dialysis.  Monitor K  Hypertension blood pressure fairly stable.  Monitor and if remains high despite dialysis sessions can start oral meds  Vertigo/Diplopia: Unclear etiology MRI brain no acute finding.  Likely in the setting of uremia severe AKI.  Symptoms improving.  Continue conservative management continue dialysis treatment as #1.encourage ambulation.   Normocytic anemia: Stable. In the setting of renal failure.  Monitor.  Leukocytosis-?Cause likely reactive/steroid. No fever.  Monitor. Recent Labs  Lab 03/11/19 0604 03/12/19 0803 03/13/19 0337 03/14/19 0826 03/15/19 0511  WBC 10.2 16.8* 14.1* 15.1* 13.3*   Sinus bradycardia-stable.  Tobacco use: Cessation advised.  DVT prophylaxis:SCD Code Status:FULL Family Communication: plan of care discussed with patient at bedside. Disposition Plan: Patient is from:HOMELESS Anticipated Disposition: TBD/homeless shelter? Barriers to discharge or conditions that needs to be met prior to discharge: patient admitted with severe acute renal failure/hyperkalemia- needing initiation of dialysis, renal biopsy and pending further stabilization signing off by nephrology.  Will be difficult disposition given lack of insurance and homelessness.Remains in the hospital.  Social worker following as patient is homeless and uninsured. Discussed with nephrologist.  Consultants: NEPHRO Procedures:see note Microbiology:see note  Medications: Scheduled Meds: . Chlorhexidine Gluconate Cloth  6 each Topical Q0600  . Chlorhexidine Gluconate Cloth  6 each Topical Q0600  . cyclophosphamide  125 mg Oral Daily  . darbepoetin (ARANESP) injection - NON-DIALYSIS  100 mcg Subcutaneous Q Sun-1800  . insulin aspart  0-9 Units Subcutaneous TID WC  . predniSONE  60 mg Oral Q breakfast  . sulfamethoxazole-trimethoprim  1 tablet Oral Daily  . traZODone  50 mg Oral Once   Continuous  Infusions:   Antimicrobials: Anti-infectives (From admission, onward)    Start     Dose/Rate Route Frequency Ordered Stop   03/12/19 1536  ceFAZolin (ANCEF) 2-4 GM/100ML-% IVPB    Note to Pharmacy: Rodney Booze   : cabinet override      03/12/19 1536 03/12/19 1606   03/12/19 1300  sulfamethoxazole-trimethoprim (BACTRIM) 400-80 MG per tablet 1 tablet     1 tablet Oral Daily 03/12/19 1259     03/12/19 0800  ceFAZolin (ANCEF) IVPB 2g/100 mL premix     2 g 200 mL/hr over 30 Minutes Intravenous  Once 03/11/19 1615 03/12/19 1610       Objective: Vitals: Today's Vitals   03/16/19 2112 03/16/19 2121 03/17/19 0446 03/17/19 0500  BP: 138/82  (!) 161/96   Pulse: 62  (!) 55   Resp: 18  18   Temp: 98 F (36.7 C)  98 F (36.7 C)   TempSrc: Oral  Oral   SpO2: 97%  98%   Weight: 78.1 kg   78.1 kg  Height:      PainSc:  0-No pain      Intake/Output Summary (Last 24 hours) at 03/17/2019 0733 Last data filed at 03/17/2019 0446 Gross per 24 hour  Intake 1260 ml  Output 1000 ml  Net 260 ml   Filed Weights   03/15/19 2059 03/16/19 2112 03/17/19 0500  Weight: 78 kg 78.1 kg 78.1 kg   Weight change: 0.089 kg   Intake/Output from previous day: 03/14 0701 - 03/15 0700 In: 1260 [P.O.:1260] Out: 1000 [Urine:1000] Intake/Output this shift: No intake/output data recorded.  Examination:  General exam: AAOx3 , NAD, weak appearing. HEENT:Oral mucosa moist, Ear/Nose WNL grossly, dentition normal. Respiratory system: bilaterally clear without use of accessory muscle Cardiovascular system: S1 & S2 +, No JVD,. Gastrointestinal system: Abdomen soft, NT,ND, BS+ Nervous System:Alert, awake, moving extremities and grossly nonfocal Extremities: No edema, distal peripheral pulses palpable.  Skin: No rashes,no icterus. MSK: Normal muscle bulk,tone, power Right IJ HD catheter present  Data Reviewed: I have personally reviewed following labs and imaging studies CBC: Recent Labs  Lab 03/11/19 0604 03/12/19 0803 03/13/19 0337 03/14/19 0826 03/15/19 0511  WBC 10.2  16.8* 14.1* 15.1* 13.3*  HGB 9.2* 9.2* 9.0* 10.0* 8.9*  HCT 29.1* 28.3* 27.5* 31.5* 27.1*  MCV 92.1 90.1 88.4 91.8 87.7  PLT 203 221 187 211 664   Basic Metabolic Panel: Recent Labs  Lab 03/13/19 0337 03/14/19 0826 03/15/19 0511 03/16/19 1056 03/17/19 0410  NA 140 141 141 141 141  K 4.6 4.5 4.2 3.7 3.9  CL 108 109 105 107 108  CO2 20* 20* 25 23 22   GLUCOSE 151* 128* 98 86 102*  BUN 83* 103* 64* 82* 90*  CREATININE 7.79* 8.34* 5.74* 6.49* 6.97*  CALCIUM 7.4* 8.0* 7.7* 7.4* 7.4*  PHOS  --   --   --  5.5* 6.1*   GFR: Estimated Creatinine Clearance: 10.5 mL/min (A) (by C-G formula based on SCr of 6.97 mg/dL (H)). Liver Function Tests: Recent Labs  Lab 03/10/19 0754 03/11/19 0604 03/16/19 1056 03/17/19 0410  AST 10* 10*  --   --   ALT 16 12  --   --   ALKPHOS 78 60  --   --   BILITOT 0.6 0.3  --   --   PROT 7.5 6.4*  --   --   ALBUMIN 3.4* 2.9* 2.3* 2.1*   No results for input(s): LIPASE, AMYLASE in the last 168 hours.  No results for input(s): AMMONIA in the last 168 hours. Coagulation Profile: Recent Labs  Lab 03/12/19 0838  INR 1.1   Cardiac Enzymes: Recent Labs  Lab 03/10/19 1637  CKTOTAL 45*   BNP (last 3 results) No results for input(s): PROBNP in the last 8760 hours. HbA1C: No results for input(s): HGBA1C in the last 72 hours. CBG: Recent Labs  Lab 03/16/19 0715 03/16/19 1121 03/16/19 1629 03/16/19 2112 03/17/19 0708  GLUCAP 87 78 154* 166* 84   Lipid Profile: No results for input(s): CHOL, HDL, LDLCALC, TRIG, CHOLHDL, LDLDIRECT in the last 72 hours. Thyroid Function Tests: No results for input(s): TSH, T4TOTAL, FREET4, T3FREE, THYROIDAB in the last 72 hours. Anemia Panel: Recent Labs    03/14/19 0826  FERRITIN 233  TIBC 232*  IRON 120   Sepsis Labs: No results for input(s): PROCALCITON, LATICACIDVEN in the last 168 hours.  Recent Results (from the past 240 hour(s))  SARS CORONAVIRUS 2 (TAT 6-24 HRS) Nasopharyngeal Nasopharyngeal  Swab     Status: None   Collection Time: 03/10/19  9:42 AM   Specimen: Nasopharyngeal Swab  Result Value Ref Range Status   SARS Coronavirus 2 NEGATIVE NEGATIVE Final    Comment: (NOTE) SARS-CoV-2 target nucleic acids are NOT DETECTED. The SARS-CoV-2 RNA is generally detectable in upper and lower respiratory specimens during the acute phase of infection. Negative results do not preclude SARS-CoV-2 infection, do not rule out co-infections with other pathogens, and should not be used as the sole basis for treatment or other patient management decisions. Negative results must be combined with clinical observations, patient history, and epidemiological information. The expected result is Negative. Fact Sheet for Patients: SugarRoll.be Fact Sheet for Healthcare Providers: https://www.woods-mathews.com/ This test is not yet approved or cleared by the [place] FDA and  has been authorized for detection and/or diagnosis of SARS-CoV-2 by FDA under an Emergency Use Authorization (EUA). This EUA will remain  in effect (meaning this test can be used) for the duration of the COVID-19 declaration under Section 56 4(b)(1) of the Act, 21 U.S.C. section 360bbb-3(b)(1), unless the authorization is terminated or revoked sooner. Performed at [facility], [place] [address], [place], [place] [address]       Radiology Studies: No results found.   LOS: 6 days   Time spent: More than 50% of that time was spent in counseling and/or coordination of care.  Antonieta Pert, MD [facility]  03/17/2019, 7:33 AM

## 2019-03-18 ENCOUNTER — Inpatient Hospital Stay (HOSPITAL_COMMUNITY): Payer: Medicaid Other

## 2019-03-18 LAB — GLUCOSE, CAPILLARY
Glucose-Capillary: 86 mg/dL (ref 70–99)
Glucose-Capillary: 108 mg/dL — ABNORMAL HIGH (ref 70–99)
Glucose-Capillary: 191 mg/dL — ABNORMAL HIGH (ref 70–99)
Glucose-Capillary: 153 mg/dL — ABNORMAL HIGH (ref 70–99)

## 2019-03-18 LAB — RENAL FUNCTION PANEL
Albumin: 2.1 g/dL — ABNORMAL LOW (ref 3.5–5.0)
Anion gap: 12 (ref 5–15)
BUN: 100 mg/dL — ABNORMAL HIGH (ref 6–20)
CO2: 22 mmol/L (ref 22–32)
Calcium: 7.7 mg/dL — ABNORMAL LOW (ref 8.9–10.3)
Chloride: 107 mmol/L (ref 98–111)
Creatinine, Ser: 7.18 mg/dL — ABNORMAL HIGH (ref 0.61–1.24)
GFR calc Af Amer: 9 mL/min — ABNORMAL LOW (ref >60)
GFR calc non Af Amer: 8 mL/min — ABNORMAL LOW (ref >60)
Glucose, Bld: 94 mg/dL (ref 70–99)
Phosphorus: 5.8 mg/dL — ABNORMAL HIGH (ref 2.5–4.6)
Potassium: 4.3 mmol/L (ref 3.5–5.1)
Sodium: 141 mmol/L (ref 135–145)

## 2019-03-18 IMAGING — CT CT CHEST W/O CM
2 of 4 series · 15 of 36 positions shown, 18 images · non-contrast
Comparison: None.

CLINICAL DATA: History of COPD assessing for lung involvement of
ZEUS LEE.

EXAM:
CT CHEST WITHOUT CONTRAST
TECHNIQUE: Multidetector CT imaging of the chest was performed following the
standard protocol without IV contrast.

[Series 4: thorax 2.0 · axial · 0.78mm/px · z∈[+1108,+1414]mm · 12 of 172 slices shown, 15 images]
[im 10/172  mediastinal]
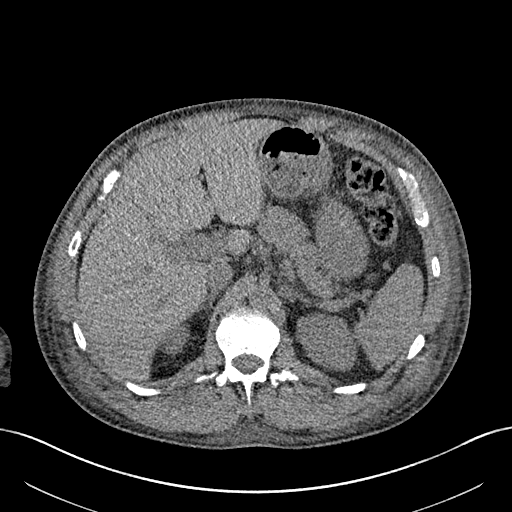
[im 10/172  lung]
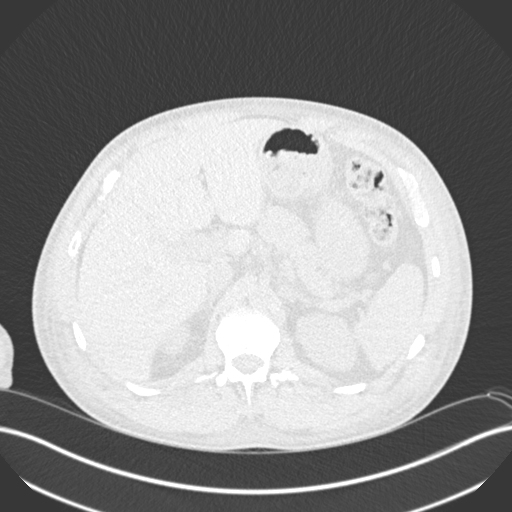
[im 28/172  lung]
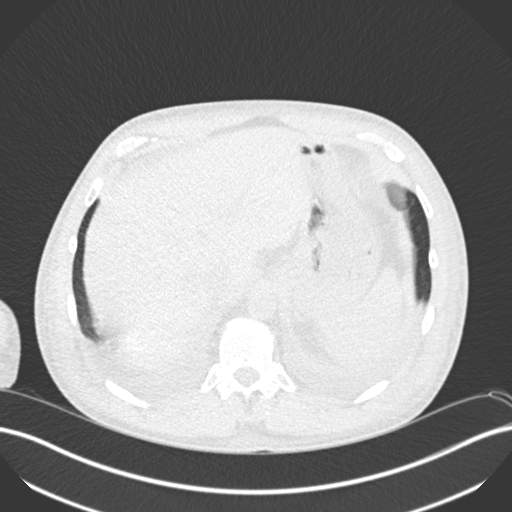
[im 37/172  lung]
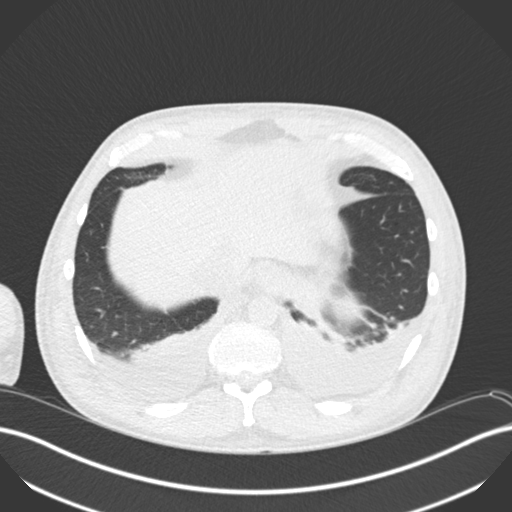
[im 55/172  lung]
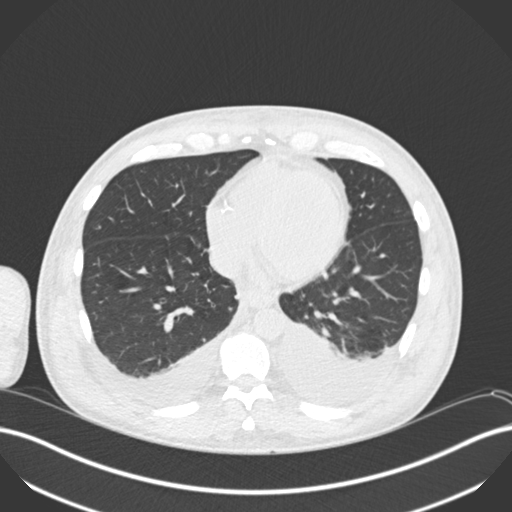
[im 64/172  mediastinal]
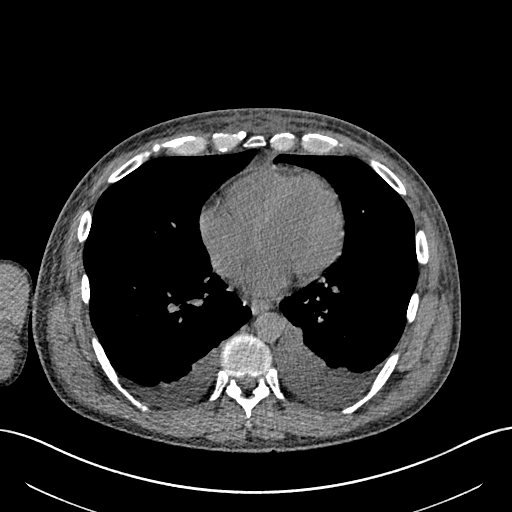
[im 64/172  lung]
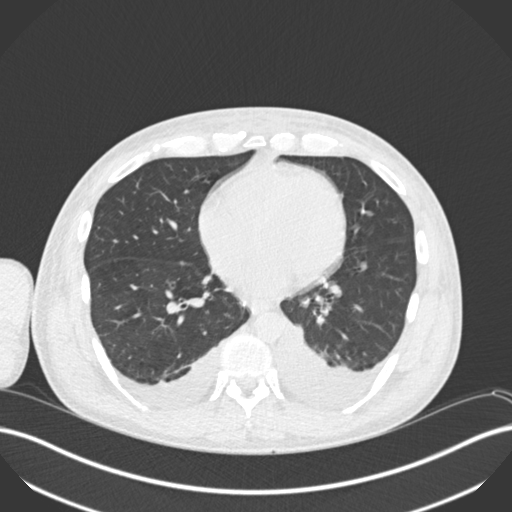
[im 82/172  lung]
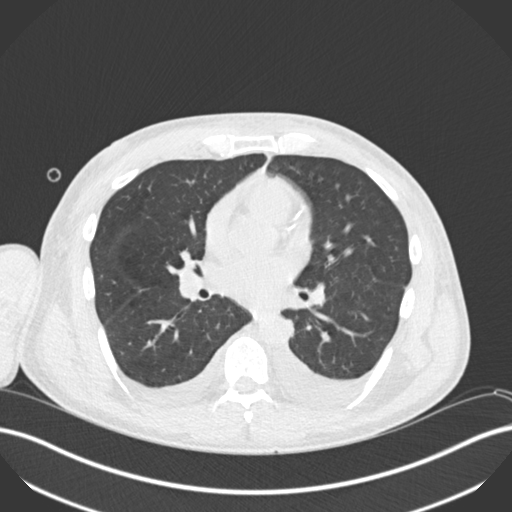
[im 91/172  lung]
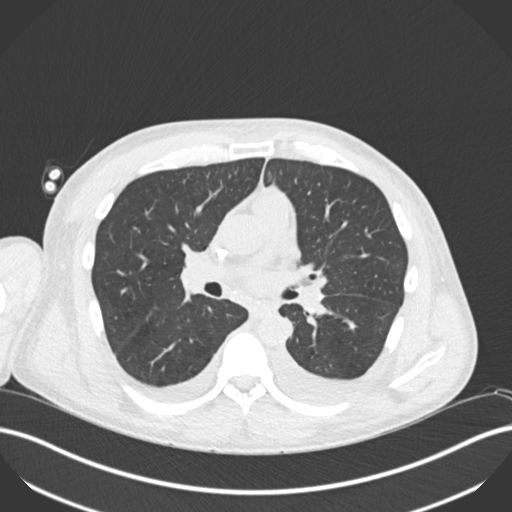
[im 109/172  lung]
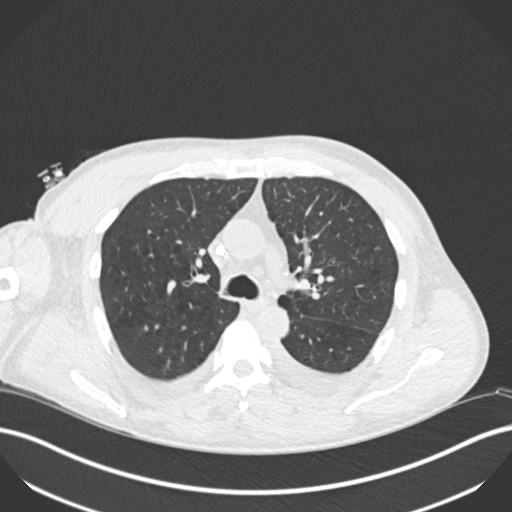
[im 118/172  mediastinal]
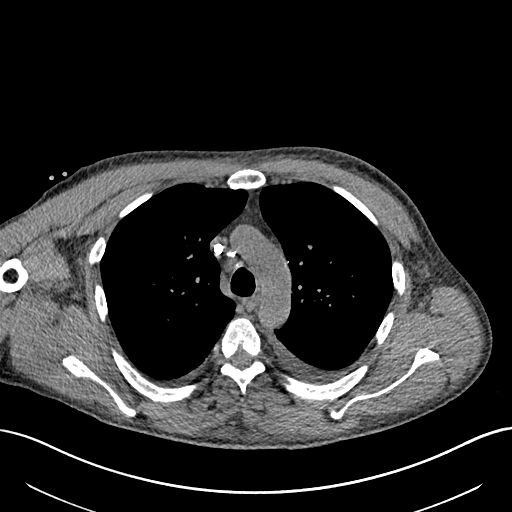
[im 118/172  lung]
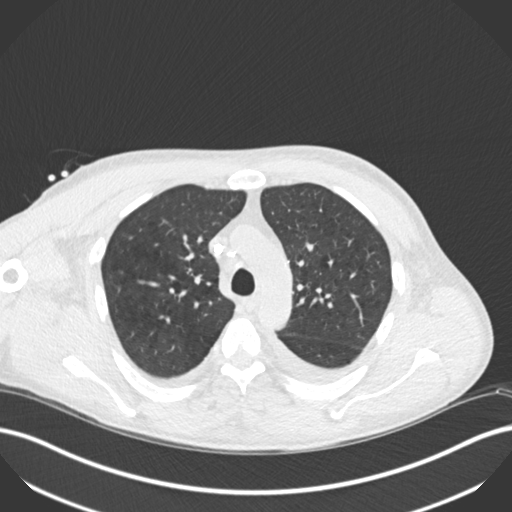
[im 136/172  lung]
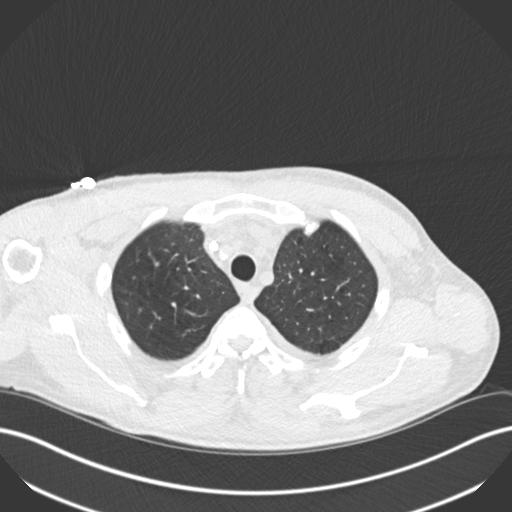
[im 145/172  lung]
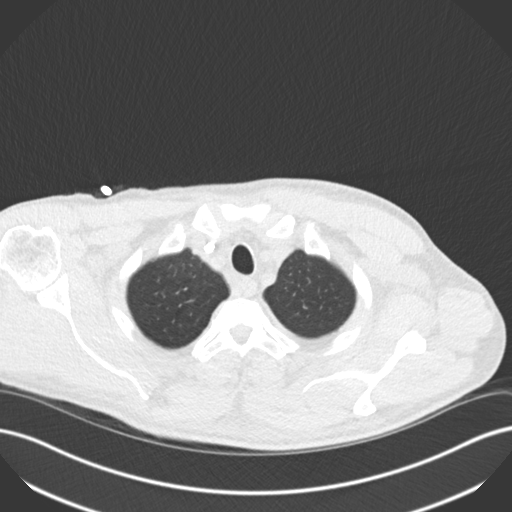
[im 163/172  lung]
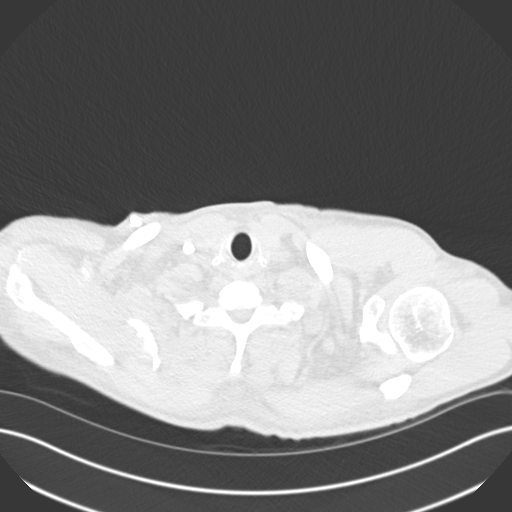

[Series 6: coronal · coronal · 0.67mm/px · 3 of 101 slices shown]
[im 21/101  lung]
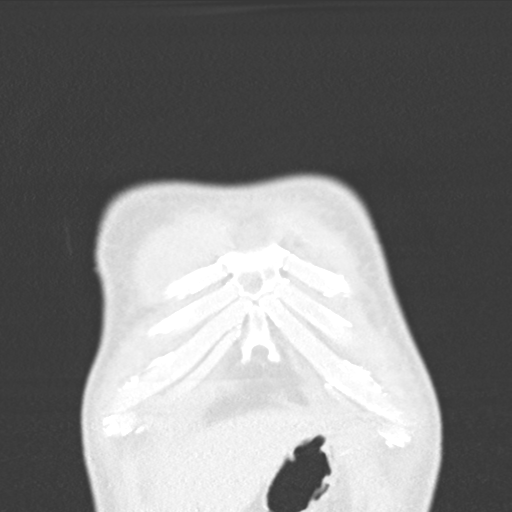
[im 41/101  lung]
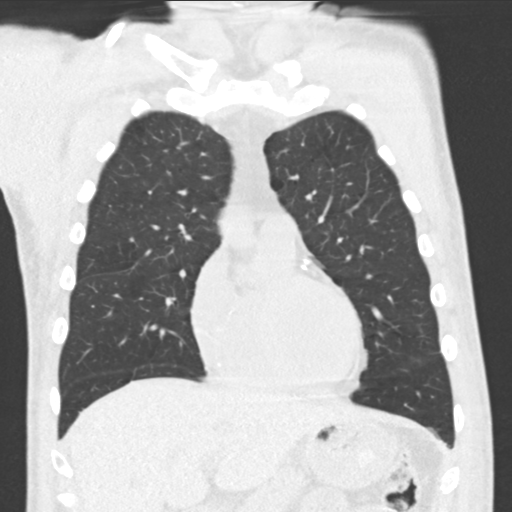
[im 61/101  lung]
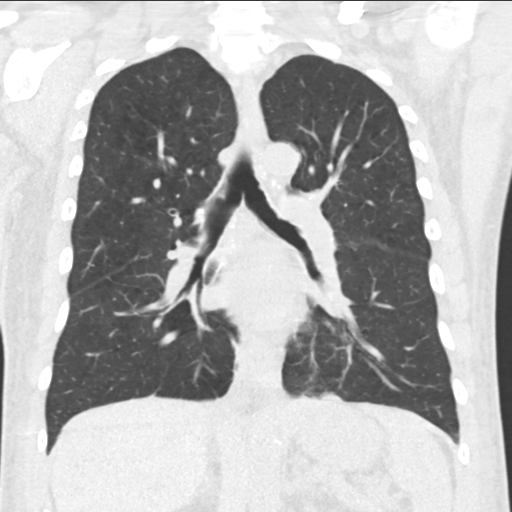

[15 of 36 positions shown; findings below may reference images not displayed]

FINDINGS: Cardiovascular: A right-sided venous catheter is in place. No
significant vascular findings. Normal heart size. A trace amount of
pleural fluid is seen. Marked severity coronary artery calcification
is seen.

Mediastinum/Nodes: Multiple small partially calcified pretracheal
and bilateral hilar lymph nodes are seen.

Lungs/Pleura: Mild atelectasis is seen within the bilateral lung
bases.

There are small bilateral pleural effusions.

No pneumothorax is identified.

Upper Abdomen: No acute abnormality.

Musculoskeletal: No chest wall mass or suspicious bone lesions
identified.
IMPRESSION: 1. Small bilateral pleural effusions.
2. Mild bibasilar atelectasis.
3. Small partially calcified pretracheal and bilateral hilar lymph
nodes.

## 2019-03-18 MED ORDER — HEPARIN SODIUM (PORCINE) 1000 UNIT/ML IJ SOLN
INTRAMUSCULAR | Status: AC
  Filled 2019-03-18: qty 4

## 2019-03-18 NOTE — Progress Notes (Signed)
[facility] Progress Note    Assessment/ Plan:   Pt is a 59 y.o. yo male homeless smoker who was admitted on 03/10/2019 with N/V and dizziness- found to have a crt of 10- no old data   Assessment/Plan: 1. Renal-  AKI vs CKD.  Had hematuria- work up showed anti MPO markedly positive.  S/p renal biopsy on 3/12-  Got a preliminary report.  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  Felt there was salvageable kidney tissue to attempt treatment- final report pending as of 03/18/19.  Received bolus steroids for 3 days now on pred 60 daily.  Started oral cytoxan 125 mg daily on 3/10.  Required HD on 3/10 and 3/12.  Nonoliguric.  HD today for clearance 3/16.  Hep and HIV negative, getting quantiferon gold.  Will also check CT chest w/o contrast given possible pulm/ renal syndrome and smoking history    2. HTN/vol- tank is full-  Doesn't need more fluids.  No BP meds yet-  Has been dizzy - BP variable-  Anxiety is a factor as well - no change 3. Anemia- iron stores OK, will add on ESA  Subjective:    UOP good, Cr still uptrending.     Objective:   BP (!) 146/86 (BP Location: Right Arm)   Pulse (!) 57   Temp 97.8 F (36.6 C) (Oral)   Resp 20   Ht 5\' 2"  (1.575 m)   Wt 77.6 kg   SpO2 99%   BMI 31.29 kg/m   Intake/Output Summary (Last 24 hours) at 03/18/2019 1134 Last data filed at 03/18/2019 0900 Gross per 24 hour  Intake 1260 ml  Output 660 ml  Net 600 ml   Weight change:   Physical Exam: Gen: NAD, sitting in bed CVS: RRR no m/r/g Resp: bilateral wheezing Abd: soft nontender Ext: no LE edema ACCESS: R IJ TDC  Imaging: No results found.  Labs: BMET Recent Labs  Lab 03/12/19 0803 03/13/19 0337 03/14/19 0826 03/15/19 0511 03/16/19 1056 03/17/19 0410 03/18/19 0413  NA 141 140 141 141 141 141 141  K 5.6* 4.6 4.5 4.2 3.7 3.9 4.3  CL 111 108 109 105 107  108 107  CO2 17* 20* 20* 25 23 22 22   GLUCOSE 127* 151* 128* 98 86 102* 94  BUN 98* 83* 103* 64* 82* 90* 100*  CREATININE 10.64* 7.79* 8.34* 5.74* 6.49* 6.97* 7.18*  CALCIUM 8.5* 7.4* 8.0* 7.7* 7.4* 7.4* 7.7*  PHOS  --   --   --   --  5.5* 6.1* 5.8*   CBC Recent Labs  Lab 03/12/19 0803 03/13/19 0337 03/14/19 0826 03/15/19 0511  WBC 16.8* 14.1* 15.1* 13.3*  HGB 9.2* 9.0* 10.0* 8.9*  HCT 28.3* 27.5* 31.5* 27.1*  MCV 90.1 88.4 91.8 87.7  PLT 221 187 211 178    Medications:    . Chlorhexidine Gluconate Cloth  6 each Topical Q0600  . Chlorhexidine Gluconate Cloth  6 each Topical Q0600  . cyclophosphamide  125 mg Oral Daily  . darbepoetin (ARANESP) injection - NON-DIALYSIS  100 mcg Subcutaneous Q Sun-1800  . heparin injection (subcutaneous)  5,000 Units Subcutaneous Q8H  . insulin aspart  0-9 Units Subcutaneous TID WC  . predniSONE  60 mg Oral Q breakfast  . sulfamethoxazole-trimethoprim  1 tablet Oral Daily  . traZODone  50 mg Oral Once      Madelon Lips, MD  03/18/2019, 11:34 AM

## 2019-03-18 NOTE — Progress Notes (Signed)
PROGRESS NOTE    Gabriel Harris  QZR:[id number] DOB: 1960/07/21 DOA: 03/10/2019 PCP: Patient, No Pcp Per   Brief Narrative: Per HPI: 59 y.o.malewithno significant past medical history except for tobacco use disorder who presented to the [facility] ED with complaints of 2 weeks of worsening dizziness, nausea, tinnitus, diplopia, spinning sensation, associated with 3 days of diarrhea and at least 5 episodes of emesis. In the ED, MRI negative for any intracranial findings but was found to have significantly elevated creatinine greater than 10.  Was started on IV fluid and nephrology was consulted.  Work-up showed concern for possible GN.  Unfortunately creatinine continues to trend up at >11 with oliguria.  Nephrology recommended transfer to [facility] for possible hemodialysis.  3/10 IR has been consulted for placement of line for HD. 3/11-had renal biopsy. 3/12- HD session for clearance no volume removal.  Subjective:  No new complaints.  Resting comfortably. For dialysis today. Urine output 600 mL today.  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 S/p bolus steroid and now on prednisone 60 mg, cyclophosphamide 125 mg daily and Bactrim 1 tablet 3/10. Started on HD w/  right IJ HD catheter- HD 3/10, 3/12 and HD 3/16.Cont current plan as per nephro-hoping for renal recovery if not will need outpatient dialysis set up. Recent Labs  Lab 03/14/19 0826 03/15/19 0511 03/16/19 1056 03/17/19 0410 03/18/19 0413  BUN 103* 64* 82* 90* 100*  CREATININE 8.34* 5.74* 6.49* 6.97* 7.18*   Hyperkalemia : Improved with Lokelma and dialysis.  Monitor K  Hypertension blood pressure fairly stable.  Monitor  and if remains high despite dialysis sessions can start oral meds  Vertigo/Diplopia: Unclear etiology MRI brain no acute finding.  Likely in the setting of uremia severe AKI.  Symptoms improving.  Continue conservative management continue dialysis treatment as #1.encourage ambulation.   Normocytic anemia: Stable. In the setting of renal failure.  Added ESA.  Monitor.  Leukocytosis-?Cause likely reactive/steroid. No fever.  Monitor. Recent Labs  Lab 03/12/19 0803 03/13/19 0337 03/14/19 0826 03/15/19 0511  WBC 16.8* 14.1* 15.1* 13.3*   Sinus bradycardia-stable.  Tobacco use: Cessation advised.  DVT prophylaxis:SCD Code Status:FULL Family Communication: plan of care discussed with patient at bedside. Disposition Plan: Patient is from:HOMELESS Anticipated Disposition: TBD/homeless shelter? Barriers to discharge or conditions that needs to be met prior to discharge: patient admitted with severe acute renal failure/hyperkalemia- needing initiation of dialysis, renal biopsy and pending further stabilization signing off by nephrology.  Will be difficult disposition given lack of insurance and homelessness.Remains in the hospital.  Social worker following as patient is homeless and uninsured.  Consultants: NEPHRO Procedures:see note Microbiology:see note  Medications: Scheduled Meds: . Chlorhexidine Gluconate Cloth  6 each Topical Q0600  . Chlorhexidine Gluconate Cloth  6 each Topical Q0600  . cyclophosphamide  125 mg Oral Daily  . darbepoetin (ARANESP) injection - NON-DIALYSIS  100 mcg Subcutaneous Q Sun-1800  . heparin injection (subcutaneous)  5,000 Units Subcutaneous Q8H  . insulin aspart  0-9 Units Subcutaneous TID WC  . predniSONE  60 mg Oral Q breakfast  . sulfamethoxazole-trimethoprim  1 tablet Oral Daily  . traZODone  50 mg Oral Once   Continuous Infusions:   Antimicrobials: Anti-infectives (From admission, onward)   Start  Dose/Rate Route Frequency Ordered Stop    03/12/19 1536  ceFAZolin (ANCEF) 2-4 GM/100ML-% IVPB    Note to Pharmacy: Rodney Booze   : cabinet override      03/12/19 1536 03/12/19 1606   03/12/19 1300  sulfamethoxazole-trimethoprim (BACTRIM) 400-80 MG per tablet 1 tablet     1 tablet Oral Daily 03/12/19 1259     03/12/19 0800  ceFAZolin (ANCEF) IVPB 2g/100 mL premix     2 g 200 mL/hr over 30 Minutes Intravenous  Once 03/11/19 1615 03/12/19 1610       Objective: Vitals: Today's Vitals   03/18/19 0900 03/18/19 1030 03/18/19 1035 03/18/19 1100  BP:  135/84 (!) 147/82 (!) 146/86  Pulse:  67 66 (!) 57  Resp:  _0 Temp:  97.8 F (36.6 C)    TempSrc:  Oral    SpO2:  99%    Weight:  77.6 kg    Height:      PainSc: Asleep 0-No pain      Intake/Output Summary (Last 24 hours) at 03/18/2019 1139 Last data filed at 03/18/2019 0900 Gross per 24 hour  Intake 1260 ml  Output 660 ml  Net 600 ml   Filed Weights   03/16/19 2112 03/17/19 0500 03/18/19 1030  Weight: 78.1 kg 78.1 kg 77.6 kg   Weight change:    Intake/Output from previous day: 03/15 0701 - 03/16 0700 In: 1200 [P.O.:1200] Out: 660 [Urine:660] Intake/Output this shift: Total I/O In: 300 [P.O.:300] Out: -   Examination:  General exam: Alert awake, not in acute distress. HEENT:Oral mucosa moist, Ear/Nose WNL grossly, dentition normal. Respiratory system: Bilaterally clear without crackles and wheezing.   Cardiovascular system: S1 & S2 +, No JVD,. Gastrointestinal system: Abdomen soft, NT,ND, BS+ Nervous System:Alert, awake, moving extremities and grossly nonfocal Extremities: No edema, distal peripheral pulses palpable.  Skin: No rashes,no icterus. MSK: Normal muscle bulk,tone, power Right IJ HD catheter present  Data Reviewed: I have personally reviewed following labs and imaging studies CBC: Recent Labs  Lab 03/12/19 0803 03/13/19 0337 03/14/19 0826 03/15/19 0511  WBC 16.8* 14.1* 15.1* 13.3*  HGB 9.2* 9.0* 10.0* 8.9*  HCT 28.3* 27.5*  31.5* 27.1*  MCV 90.1 88.4 91.8 87.7  PLT 221 187 211 678   Basic Metabolic Panel: Recent Labs  Lab 03/14/19 0826 03/15/19 0511 03/16/19 1056 03/17/19 0410 03/18/19 0413  NA 141 141 141 141 141  K 4.5 4.2 3.7 3.9 4.3  CL 109 105 107 108 107  CO2 20* _1 GLUCOSE 128* 98 86 102* 94  BUN 103* 64* 82* 90* 100*  CREATININE 8.34* 5.74* 6.49* 6.97* 7.18*  CALCIUM 8.0* 7.7* 7.4* 7.4* 7.7*  PHOS  --   --  5.5* 6.1* 5.8*   GFR: Estimated Creatinine Clearance: 10.1 mL/min (A) (by C-G formula based on SCr of 7.18 mg/dL (H)). Liver Function Tests: Recent Labs  Lab 03/16/19 1056 03/17/19 0410 03/18/19 0413  ALBUMIN 2.3* 2.1* 2.1*   No results for input(s): LIPASE, AMYLASE in the last 168 hours. No results for input(s): AMMONIA in the last 168 hours. Coagulation Profile: Recent Labs  Lab 03/12/19 0838  INR 1.1   Cardiac Enzymes: No results for input(s): CKTOTAL, CKMB, CKMBINDEX, TROPONINI in the last 168 hours. BNP (last 3 results) No results for input(s): PROBNP in the last 8760 hours. HbA1C: No results for input(s): HGBA1C in the last 72 hours. CBG: Recent Labs  Lab 03/17/19 0708 03/17/19 1140 03/17/19 1641 03/17/19  2015 03/18/19 0721  GLUCAP 84 93 136* 123* 86   Lipid Profile: No results for input(s): CHOL, HDL, LDLCALC, TRIG, CHOLHDL, LDLDIRECT in the last 72 hours. Thyroid Function Tests: No results for input(s): TSH, T4TOTAL, FREET4, T3FREE, THYROIDAB in the last 72 hours. Anemia Panel: No results for input(s): VITAMINB12, FOLATE, FERRITIN, TIBC, IRON, RETICCTPCT in the last 72 hours. Sepsis Labs: No results for input(s): PROCALCITON, LATICACIDVEN in the last 168 hours.  Recent Results (from the past 240 hour(s))  SARS CORONAVIRUS 2 (TAT 6-24 HRS) Nasopharyngeal Nasopharyngeal Swab     Status: None   Collection Time: 03/10/19  9:42 AM   Specimen: Nasopharyngeal Swab  Result Value Ref Range Status   SARS Coronavirus 2 NEGATIVE NEGATIVE Final     Comment: (NOTE) SARS-CoV-2 target nucleic acids are NOT DETECTED. The SARS-CoV-2 RNA is generally detectable in upper and lower respiratory specimens during the acute phase of infection. Negative results do not preclude SARS-CoV-2 infection, do not rule out co-infections with other pathogens, and should not be used as the sole basis for treatment or other patient management decisions. Negative results must be combined with clinical observations, patient history, and epidemiological information. The expected result is Negative. Fact Sheet for Patients: SugarRoll.be Fact Sheet for Healthcare Providers: https://www.woods-mathews.com/ This test is not yet approved or cleared by the [place] FDA and  has been authorized for detection and/or diagnosis of SARS-CoV-2 by FDA under an Emergency Use Authorization (EUA). This EUA will remain  in effect (meaning this test can be used) for the duration of the COVID-19 declaration under Section 56 4(b)(1) of the Act, 21 U.S.C. section 360bbb-3(b)(1), unless the authorization is terminated or revoked sooner. Performed at [facility], [place] [address], [place], [place] [address]       Radiology Studies: No results found.   LOS: 7 days   Time spent: More than 50% of that time was spent in counseling and/or coordination of care.  Antonieta Pert, MD [facility]  03/18/2019, 11:39 AM

## 2019-03-18 NOTE — Progress Notes (Signed)
Walk in room to give patient insulin and sister brings him a box of bojangles chicken and a boberry biscuit.  Patient asked if he could have the biscuit and I advised him against eating the buscuit.  Reminded him of his renal diet and also the diabetic issues as well.

## 2019-03-19 LAB — QUANTIFERON-TB GOLD PLUS (RQFGPL)
QuantiFERON Mitogen Value: 0.06 IU/mL
QuantiFERON Nil Value: 0.01 IU/mL
QuantiFERON TB1 Ag Value: 0.02 IU/mL
QuantiFERON TB2 Ag Value: 0.01 IU/mL

## 2019-03-19 LAB — RENAL FUNCTION PANEL
Albumin: 2.3 g/dL — ABNORMAL LOW (ref 3.5–5.0)
Anion gap: 11 (ref 5–15)
BUN: 66 mg/dL — ABNORMAL HIGH (ref 6–20)
CO2: 27 mmol/L (ref 22–32)
Calcium: 7.7 mg/dL — ABNORMAL LOW (ref 8.9–10.3)
Chloride: 106 mmol/L (ref 98–111)
Creatinine, Ser: 5.72 mg/dL — ABNORMAL HIGH (ref 0.61–1.24)
GFR calc Af Amer: 12 mL/min — ABNORMAL LOW (ref >60)
GFR calc non Af Amer: 10 mL/min — ABNORMAL LOW (ref >60)
Glucose, Bld: 100 mg/dL — ABNORMAL HIGH (ref 70–99)
Phosphorus: 4.5 mg/dL (ref 2.5–4.6)
Potassium: 4.2 mmol/L (ref 3.5–5.1)
Sodium: 144 mmol/L (ref 135–145)

## 2019-03-19 LAB — GLUCOSE, CAPILLARY
Glucose-Capillary: 123 mg/dL — ABNORMAL HIGH (ref 70–99)
Glucose-Capillary: 80 mg/dL (ref 70–99)
Glucose-Capillary: 100 mg/dL — ABNORMAL HIGH (ref 70–99)

## 2019-03-19 LAB — QUANTIFERON-TB GOLD PLUS: QuantiFERON-TB Gold Plus: UNDETERMINED — AB

## 2019-03-19 NOTE — Plan of Care (Signed)
  Problem: Health Behavior/Discharge Planning: Goal: Ability to manage health-related needs will improve Outcome: Progressing   Problem: Education: Goal: Knowledge of disease and its progression will improve Outcome: Progressing

## 2019-03-19 NOTE — Progress Notes (Signed)
PROGRESS NOTE    Gabriel Harris  ZOX:[id number] DOB: 1960/10/01 DOA: 03/10/2019 PCP: Patient, No Pcp Per   Brief Narrative: Per HPI: 59 y.o.malewithno significant past medical history except for tobacco use disorder who presented to the [facility] ED with complaints of 2 weeks of worsening dizziness, nausea, tinnitus, diplopia, spinning sensation, associated with 3 days of diarrhea and at least 5 episodes of emesis. In the ED, MRI negative for any intracranial findings but was found to have significantly elevated creatinine greater than 10.  Was started on IV fluid and nephrology was consulted.  Work-up showed concern for possible GN.  Unfortunately creatinine continues to trend up at >11 with oliguria.  Nephrology recommended transfer to [facility] for possible hemodialysis.  3/10 IR has been consulted for placement of line for HD. 3/11-had renal biopsy. 3/12- HD session for clearance no volume removal.  Subjective:  Resting on the bed comfortably.  Denies any complaint please Last dialysis 3/16. Urine output 1050 mL but BUN/creatinine still high  Assessment & Plan:  Severe AKI with oliguria  Vs CKD with metabolic acidosis: Had hematuria and work-up showed anti-MPO markedly positive,no significant prior medical problems, hba1c 5.8, positive smoker.  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 S/p bolus steroid and now on prednisone 60 mg, cyclophosphamide 125 mg daily and Bactrim 1 tablet since 3/10. Started on HD w/  right IJ HD catheter- s/p HD 3/10, 3/12, 3/16.CT chest no acute finding.  Nephrology now looking into outpatient AKI HD.  Patient does not seem to be happy with that.  Continue plan as per nephrology.  Recent Labs  Lab 03/15/19 0511 03/16/19 1056 03/17/19 0410 03/18/19 0413 03/19/19 0707  BUN 64* 82* 90* 100* 66*  CREATININE 5.74* 6.49* 6.97* 7.18* 5.72*   Hyperkalemia :  Improved with Lokelma and dialysis.  Monitor K  Hypertension blood pressure fairly stable.  Intermittently high, no BP meds yet-he has been dizzy.    Vertigo/Diplopia: Unclear etiology MRI brain no acute finding.  Likely in the setting of uremia severe AKI.  Symptoms improving.  Continue conservative management continue dialysis treatment as #1.encourage ambulation.   Normocytic anemia: Stable. In the setting of renal failure.  Continue ESA.  Monitor.  Leukocytosis-?Cause likely reactive/steroid. No fever.  Monitor. Recent Labs  Lab 03/13/19 0337 03/14/19 0826 03/15/19 0511  WBC 14.1* 15.1* 13.3*   Sinus bradycardia-stable.  Tobacco use: Cessation advised.  DVT prophylaxis:SCD Code Status:FULL Family Communication: plan of care discussed with patient at bedside. Disposition Plan: Patient is from:HOMELESS Anticipated Disposition: TBD/homeless shelter? Barriers to discharge or conditions that needs to be met prior to discharge: patient admitted with severe acute renal failure/hyperkalemia- needing initiation of dialysis, renal biopsy and pending further stabilization signing off by nephrology.  Will be difficult disposition given lack of insurance and homelessness.looking into outpatient AKI HD. Social worker following as patient is homeless and uninsured.  Consultants: NEPHRO Procedures: Renal biopsy HD catheter placement  CT chest 1. Small bilateral pleural effusions. 2. Mild bibasilar atelectasis. 3. Small partially calcified pretracheal and bilateral hilar lymph Nodes.   Microbiology:see note  Medications: Scheduled Meds: . Chlorhexidine Gluconate Cloth  6 each Topical Q0600  . Chlorhexidine Gluconate Cloth  6 each Topical Q0600  . cyclophosphamide  125 mg Oral Daily  . darbepoetin (ARANESP) injection - NON-DIALYSIS  100 mcg Subcutaneous Q Sun-1800  . heparin injection (subcutaneous)  5,000 Units Subcutaneous Q8H  . insulin aspart  0-9 Units Subcutaneous TID WC  .  predniSONE  60 mg Oral Q breakfast  . sulfamethoxazole-trimethoprim  1 tablet Oral Daily   Continuous Infusions:   Antimicrobials: Anti-infectives (From admission, onward)   Start     Dose/Rate Route Frequency Ordered Stop   03/12/19 1536  ceFAZolin (ANCEF) 2-4 GM/100ML-% IVPB    Note to Pharmacy: Rodney Booze   : cabinet override      03/12/19 1536 03/12/19 1606   03/12/19 1300  sulfamethoxazole-trimethoprim (BACTRIM) 400-80 MG per tablet 1 tablet     1 tablet Oral Daily 03/12/19 1259     03/12/19 0800  ceFAZolin (ANCEF) IVPB 2g/100 mL premix     2 g 200 mL/hr over 30 Minutes Intravenous  Once 03/11/19 1615 03/12/19 1610       Objective: Vitals: Today's Vitals   03/18/19 1710 03/18/19 2107 03/19/19 0536 03/19/19 0911  BP: (!) 166/92 (!) 147/78 (!) 155/93 131/74  Pulse: 65 62  64  Resp: 18 18  18   Temp: 98.5 F (36.9 C) 98.4 F (36.9 C) 97.7 F (36.5 C) 98.2 F (36.8 C)  TempSrc: Oral Oral Oral Oral  SpO2: 96% 96% 97% 96%  Weight:      Height:      PainSc:  0-No pain  0-No pain    Intake/Output Summary (Last 24 hours) at 03/19/2019 1324 Last data filed at 03/19/2019 1200 Gross per 24 hour  Intake 1220 ml  Output 1050 ml  Net 170 ml   Filed Weights   03/17/19 0500 03/18/19 1030 03/18/19 1330  Weight: 78.1 kg 77.6 kg 77.1 kg   Weight change:    Intake/Output from previous day: 03/16 0701 - 03/17 0700 In: 1040 [P.O.:1040] Out: 1250 [Urine:750] Intake/Output this shift: Total I/O In: 480 [P.O.:480] Out: -   Examination:  General exam: AAO X3, NAD, on room air.   HEENT:Oral mucosa moist, Ear/Nose WNL grossly, dentition normal. Respiratory system: Bilaterally clear without crackles and wheezing.   Cardiovascular system: S1 & S2 +, No JVD,. Gastrointestinal system: Abdomen soft, NT,ND, BS+ Nervous System:Alert, awake, moving extremities and grossly nonfocal Extremities: No edema, distal peripheral pulses palpable.  Skin: No rashes,no icterus. MSK:  Normal muscle bulk,tone, power Right IJ HD catheter present with dressing intact  Data Reviewed: I have personally reviewed following labs and imaging studies CBC: Recent Labs  Lab 03/13/19 0337 03/14/19 0826 03/15/19 0511  WBC 14.1* 15.1* 13.3*  HGB 9.0* 10.0* 8.9*  HCT 27.5* 31.5* 27.1*  MCV 88.4 91.8 87.7  PLT 187 211 449   Basic Metabolic Panel: Recent Labs  Lab 03/15/19 0511 03/16/19 1056 03/17/19 0410 03/18/19 0413 03/19/19 0707  NA 141 141 141 141 144  K 4.2 3.7 3.9 4.3 4.2  CL 105 107 108 107 106  CO2 25 23 22 22 27   GLUCOSE 98 86 102* 94 100*  BUN 64* 82* 90* 100* 66*  CREATININE 5.74* 6.49* 6.97* 7.18* 5.72*  CALCIUM 7.7* 7.4* 7.4* 7.7* 7.7*  PHOS  --  5.5* 6.1* 5.8* 4.5   GFR: Estimated Creatinine Clearance: 12.7 mL/min (A) (by C-G formula based on SCr of 5.72 mg/dL (H)). Liver Function Tests: Recent Labs  Lab 03/16/19 1056 03/17/19 0410 03/18/19 0413 03/19/19 0707  ALBUMIN 2.3* 2.1* 2.1* 2.3*   No results for input(s): LIPASE, AMYLASE in the last 168 hours. No results for input(s): AMMONIA in the last 168 hours. Coagulation Profile: No results for input(s): INR, PROTIME in the last 168 hours. Cardiac Enzymes: No results  for input(s): CKTOTAL, CKMB, CKMBINDEX, TROPONINI in the last 168 hours. BNP (last 3 results) No results for input(s): PROBNP in the last 8760 hours. HbA1C: No results for input(s): HGBA1C in the last 72 hours. CBG: Recent Labs  Lab 03/18/19 1435 03/18/19 1627 03/18/19 2109 03/19/19 0744 03/19/19 1126  GLUCAP 108* 191* 153* 123* 80   Lipid Profile: No results for input(s): CHOL, HDL, LDLCALC, TRIG, CHOLHDL, LDLDIRECT in the last 72 hours. Thyroid Function Tests: No results for input(s): TSH, T4TOTAL, FREET4, T3FREE, THYROIDAB in the last 72 hours. Anemia Panel: No results for input(s): VITAMINB12, FOLATE, FERRITIN, TIBC, IRON, RETICCTPCT in the last 72 hours. Sepsis Labs: No results for input(s): PROCALCITON,  LATICACIDVEN in the last 168 hours.  Recent Results (from the past 240 hour(s))  SARS CORONAVIRUS 2 (TAT 6-24 HRS) Nasopharyngeal Nasopharyngeal Swab     Status: None   Collection Time: 03/10/19  9:42 AM   Specimen: Nasopharyngeal Swab  Result Value Ref Range Status   SARS Coronavirus 2 NEGATIVE NEGATIVE Final    Comment: (NOTE) SARS-CoV-2 target nucleic acids are NOT DETECTED. The SARS-CoV-2 RNA is generally detectable in upper and lower respiratory specimens during the acute phase of infection. Negative results do not preclude SARS-CoV-2 infection, do not rule out co-infections with other pathogens, and should not be used as the sole basis for treatment or other patient management decisions. Negative results must be combined with clinical observations, patient history, and epidemiological information. The expected result is Negative. Fact Sheet for Patients: SugarRoll.be Fact Sheet for Healthcare Providers: https://www.woods-mathews.com/ This test is not yet approved or cleared by the [place] FDA and  has been authorized for detection and/or diagnosis of SARS-CoV-2 by FDA under an Emergency Use Authorization (EUA). This EUA will remain  in effect (meaning this test can be used) for the duration of the COVID-19 declaration under Section 56 4(b)(1) of the Act, 21 U.S.C. section 360bbb-3(b)(1), unless the authorization is terminated or revoked sooner. Performed at [facility], [place] [address], [place], [place] [address]       Radiology Studies: CT CHEST WO CONTRAST  Result Date: 03/18/2019 CLINICAL DATA:  History of COPD assessing for lung involvement of ANCA. EXAM: CT CHEST WITHOUT CONTRAST TECHNIQUE: Multidetector CT imaging of the chest was performed following the standard protocol without IV contrast. COMPARISON:  None. FINDINGS: Cardiovascular: A right-sided venous catheter is in place. No significant vascular findings.  Normal heart size. A trace amount of pleural fluid is seen. Marked severity coronary artery calcification is seen. Mediastinum/Nodes: Multiple small partially calcified pretracheal and bilateral hilar lymph nodes are seen. Lungs/Pleura: Mild atelectasis is seen within the bilateral lung bases. There are small bilateral pleural effusions. No pneumothorax is identified. Upper Abdomen: No acute abnormality. Musculoskeletal: No chest wall mass or suspicious bone lesions identified. IMPRESSION: 1. Small bilateral pleural effusions. 2. Mild bibasilar atelectasis. 3. Small partially calcified pretracheal and bilateral hilar lymph nodes. Electronically Signed   By: Virgina Norfolk M.D.   On: 03/18/2019 21:30     LOS: 8 days   Time spent: More than 50% of that time was spent in counseling and/or coordination of care.  Antonieta Pert, MD [facility]  03/19/2019, 1:24 PM

## 2019-03-19 NOTE — Progress Notes (Signed)
[facility] Progress Note    Assessment/ Plan:   Pt is a 59 y.o. yo male homeless smoker who was admitted on 03/10/2019 with N/V and dizziness- found to have a crt of 10- no old data   Assessment/Plan: 1. Renal-  AKI vs CKD.  Had hematuria- work up showed anti MPO markedly positive.  S/p renal biopsy on 3/12-  Got a preliminary report.  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  Felt there was salvageable kidney tissue to attempt treatment- final report pending as of 03/18/19.  Received bolus steroids for 3 days now on pred 60 daily.  Started oral cytoxan 125 mg daily on 3/10.  Required HD on 3/10 and 3/12.  Nonoliguric.  HD for clearance 3/16.  Hep and HIV negative, getting quantiferon gold.  Will also check CT chest w/o contrast--> no evidence of vasculitis, some pleural effusions.  We are starting to look for AKI outpt HD chair- pt not happy about that 2. HTN/vol- tank is full-  Doesn't need more fluids.  No BP meds yet-  Has been dizzy - BP variable-  Anxiety is a factor as well - no change 3. Anemia- iron stores OK, will add on ESA  Subjective:    UOP good, Cr still uptrending.     Objective:   BP 131/74 (BP Location: Right Arm)   Pulse 64   Temp 98.2 F (36.8 C) (Oral)   Resp 18   Ht 5\' 2"  (1.575 m)   Wt 77.1 kg   SpO2 96%   BMI 31.09 kg/m   Intake/Output Summary (Last 24 hours) at 03/19/2019 1124 Last data filed at 03/19/2019 0600 Gross per 24 hour  Intake 740 ml  Output 1050 ml  Net -310 ml   Weight change:   Physical Exam: Gen: NAD, sitting in bed CVS: RRR no m/r/g Resp: bilateral wheezing Abd: soft nontender Ext: no LE edema ACCESS: R IJ [facility]  Imaging: CT CHEST WO CONTRAST  Result Date: 03/18/2019 CLINICAL DATA:  History of COPD assessing for lung involvement of ANCA. EXAM: CT CHEST WITHOUT CONTRAST TECHNIQUE: Multidetector CT imaging of  the chest was performed following the standard protocol without IV contrast. COMPARISON:  None. FINDINGS: Cardiovascular: A right-sided venous catheter is in place. No significant vascular findings. Normal heart size. A trace amount of pleural fluid is seen. Marked severity coronary artery calcification is seen. Mediastinum/Nodes: Multiple small partially calcified pretracheal and bilateral hilar lymph nodes are seen. Lungs/Pleura: Mild atelectasis is seen within the bilateral lung bases. There are small bilateral pleural effusions. No pneumothorax is identified. Upper Abdomen: No acute abnormality. Musculoskeletal: No chest wall mass or suspicious bone lesions identified. IMPRESSION: 1. Small bilateral pleural effusions. 2. Mild bibasilar atelectasis. 3. Small partially calcified pretracheal and bilateral hilar lymph nodes. Electronically Signed   By: Virgina Norfolk M.D.   On: 03/18/2019 21:30    Labs: BMET Recent Labs  Lab 03/13/19 0337 03/14/19 0826 03/15/19 0511 03/16/19 1056 03/17/19 0410 03/18/19 0413 03/19/19 0707  NA 140 141 141 141 141 141 144  K 4.6 4.5 4.2 3.7 3.9 4.3 4.2  CL 108 109 105 107 108 107 106  CO2 20* 20* 25 23 22 22 27   GLUCOSE 151* 128* 98 86 102* 94 100*  BUN 83* 103* 64* 82* 90* 100* 66*  CREATININE 7.79* 8.34* 5.74* 6.49* 6.97* 7.18* 5.72*  CALCIUM 7.4* 8.0* 7.7* 7.4*  7.4* 7.7* 7.7*  PHOS  --   --   --  5.5* 6.1* 5.8* 4.5   CBC Recent Labs  Lab 03/13/19 0337 03/14/19 0826 03/15/19 0511  WBC 14.1* 15.1* 13.3*  HGB 9.0* 10.0* 8.9*  HCT 27.5* 31.5* 27.1*  MCV 88.4 91.8 87.7  PLT 187 211 178    Medications:    . Chlorhexidine Gluconate Cloth  6 each Topical Q0600  . Chlorhexidine Gluconate Cloth  6 each Topical Q0600  . cyclophosphamide  125 mg Oral Daily  . darbepoetin (ARANESP) injection - NON-DIALYSIS  100 mcg Subcutaneous Q Sun-1800  . heparin injection (subcutaneous)  5,000 Units Subcutaneous Q8H  . insulin aspart  0-9 Units Subcutaneous TID  WC  . predniSONE  60 mg Oral Q breakfast  . sulfamethoxazole-trimethoprim  1 tablet Oral Daily      Madelon Lips, MD 03/19/2019, 11:24 AM

## 2019-03-19 NOTE — Plan of Care (Signed)
  Problem: Nutrition: Goal: Adequate nutrition will be maintained Outcome: Progressing   

## 2019-03-20 LAB — RENAL FUNCTION PANEL
Albumin: 2.3 g/dL — ABNORMAL LOW (ref 3.5–5.0)
Anion gap: 13 (ref 5–15)
BUN: 72 mg/dL — ABNORMAL HIGH (ref 6–20)
CO2: 24 mmol/L (ref 22–32)
Calcium: 7.3 mg/dL — ABNORMAL LOW (ref 8.9–10.3)
Chloride: 106 mmol/L (ref 98–111)
Creatinine, Ser: 6.37 mg/dL — ABNORMAL HIGH (ref 0.61–1.24)
GFR calc Af Amer: 10 mL/min — ABNORMAL LOW (ref >60)
GFR calc non Af Amer: 9 mL/min — ABNORMAL LOW (ref >60)
Glucose, Bld: 82 mg/dL (ref 70–99)
Phosphorus: 4.9 mg/dL — ABNORMAL HIGH (ref 2.5–4.6)
Potassium: 3.9 mmol/L (ref 3.5–5.1)
Sodium: 143 mmol/L (ref 135–145)

## 2019-03-20 LAB — CBC
HCT: 26.4 % — ABNORMAL LOW (ref 39.0–52.0)
Hemoglobin: 8.5 g/dL — ABNORMAL LOW (ref 13.0–17.0)
MCH: 29 pg (ref 26.0–34.0)
MCHC: 32.2 g/dL (ref 30.0–36.0)
MCV: 90.1 fL (ref 80.0–100.0)
Platelets: 180 10*3/uL (ref 150–400)
RBC: 2.93 MIL/uL — ABNORMAL LOW (ref 4.22–5.81)
RDW: 12.7 % (ref 11.5–15.5)
WBC: 12.1 10*3/uL — ABNORMAL HIGH (ref 4.0–10.5)
nRBC: 0 % (ref 0.0–0.2)

## 2019-03-20 LAB — GLUCOSE, CAPILLARY
Glucose-Capillary: 127 mg/dL — ABNORMAL HIGH (ref 70–99)
Glucose-Capillary: 102 mg/dL — ABNORMAL HIGH (ref 70–99)
Glucose-Capillary: 168 mg/dL — ABNORMAL HIGH (ref 70–99)
Glucose-Capillary: 145 mg/dL — ABNORMAL HIGH (ref 70–99)

## 2019-03-20 MED ORDER — TUBERCULIN PPD 5 UNIT/0.1ML ID SOLN
5.0000 [IU] | Freq: Once | INTRADERMAL | Status: AC
  Administered 2019-03-20: 5 [IU] via INTRADERMAL
  Filled 2019-03-20: qty 0.1

## 2019-03-20 NOTE — Progress Notes (Signed)
PROGRESS NOTE    Gabriel Harris  EXB:[id number] DOB: 03/11/1960 DOA: 03/10/2019 PCP: Patient, No Pcp Per   Brief Narrative: Per HPI: 59 y.o.malewithno significant past medical history except for tobacco use disorder who presented to the [facility] ED with complaints of 2 weeks of worsening dizziness, nausea, tinnitus, diplopia, spinning sensation, associated with 3 days of diarrhea and at least 5 episodes of emesis. In the ED, MRI negative for any intracranial findings but was found to have significantly elevated creatinine greater than 10.  Was started on IV fluid and nephrology was consulted.  Work-up showed concern for possible GN.  Unfortunately creatinine continues to trend up at >11 with oliguria.  Nephrology recommended transfer to [facility] for possible hemodialysis.  3/10 IR has been consulted for placement of line for HD. 3/11-had renal biopsy. 3/12- HD session for clearance no volume removal.  Subjective:  This morning has no new complaints.  Resting comfortably.  Ambulating in the room. Afebrile overnight.  last dialysis 3/16. UOP charted 1100 ml/214 hr BUN/creatinine still  72/6.3  Assessment & Plan:  Severe AKI with oliguria  Vs CKD with metabolic acidosis: Had hematuria and work-up showed anti-MPO markedly positive,no significant prior medical problems, hba1c 5.8, positive smoker.  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 S/p bolus steroid and now on prednisone 60 mg, cyclophosphamide 125 mg daily and Bactrim 1 tablet since 3/10. Started on HD w/  right IJ HD catheter- s/p HD 3/10, 3/12, 3/16.CT chest no acute finding.  Nephrology now looking into outpatient AKI HD. Recent Labs  Lab 03/16/19 1056 03/17/19 0410 03/18/19 0413 03/19/19 0707 03/20/19 0411  BUN 82* 90* 100* 66* 72*  CREATININE 6.49* 6.97* 7.18* 5.72* 6.37*   Hyperkalemia : Improved with Lokelma and dialysis.   Monitor K  Hypertension blood pressure fairly stable.  Not needing meds.  Monitor.  Vertigo/Diplopia: Overall improved.  Unclear etiology MRI brain no acute finding.  Likely in the setting of uremia severe AKI.   Normocytic anemia: Stable. In the setting of renal failure.  Continue ESA.Hemoglobin 8.5 g.Monitor.  Leukocytosis-?Cause likely reactive/steroid.No fever.Monitor. Recent Labs  Lab 03/14/19 0826 03/15/19 0511 03/20/19 0411  WBC 15.1* 13.3* 12.1*   Sinus bradycardia-stable.  Tobacco NUU:[id number] advised.  DVT prophylaxis:SCD Code Status:FULL Family Communication: plan of care discussed with patient at bedside. Disposition Plan: Patient is from:HOMELESS Anticipated Disposition: TBD/homeless shelter? Barriers to discharge or conditions that needs to be met prior to discharge: patient admitted with severe acute renal failure/hyperkalemia- needing initiation of dialysis, renal biopsy and pending further stabilization signing off by nephrology.  Will be difficult disposition given lack of insurance and homelessness.looking into outpatient AKI HD. Social worker following as patient is homeless and uninsured.  Consultants: NEPHRO  Procedures: Renal biopsy HD catheter placement  CT chest 1. Small bilateral pleural effusions. 2. Mild bibasilar atelectasis. 3. Small partially calcified pretracheal and bilateral hilar lymph Nodes.   Microbiology:see note  Medications: Scheduled Meds: . Chlorhexidine Gluconate Cloth  6 each Topical Q0600  . Chlorhexidine Gluconate Cloth  6 each Topical Q0600  . cyclophosphamide  125 mg Oral Daily  . darbepoetin (ARANESP) injection - NON-DIALYSIS  100 mcg Subcutaneous Q Sun-1800  . heparin injection (subcutaneous)  5,000 Units Subcutaneous Q8H  . insulin aspart  0-9 Units Subcutaneous TID WC  . predniSONE  60 mg Oral Q breakfast  . sulfamethoxazole-trimethoprim  1 tablet Oral Daily   Continuous  Infusions:  Antimicrobials: Anti-infectives (From admission, onward)   Start     Dose/Rate Route Frequency Ordered Stop   03/12/19 1536  ceFAZolin (ANCEF) 2-4 GM/100ML-% IVPB    Note to Pharmacy: Rodney Booze   : cabinet override      03/12/19 1536 03/12/19 1606   03/12/19 1300  sulfamethoxazole-trimethoprim (BACTRIM) 400-80 MG per tablet 1 tablet     1 tablet Oral Daily 03/12/19 1259     03/12/19 0800  ceFAZolin (ANCEF) IVPB 2g/100 mL premix     2 g 200 mL/hr over 30 Minutes Intravenous  Once 03/11/19 1615 03/12/19 1610       Objective: Vitals: Today's Vitals   03/20/19 0450 03/20/19 0459 03/20/19 0810 03/20/19 0932  BP: (!) 146/88   118/70  Pulse: 64   71  Resp: 18   18  Temp: 99.1 F (37.3 C)   98.8 F (37.1 C)  TempSrc: Oral   Oral  SpO2: 97%   98%  Weight:      Height:      PainSc:  4  0-No pain     Intake/Output Summary (Last 24 hours) at 03/20/2019 1126 Last data filed at 03/20/2019 0932 Gross per 24 hour  Intake 1080 ml  Output 1275 ml  Net -195 ml   Filed Weights   03/17/19 0500 03/18/19 1030 03/18/19 1330  Weight: 78.1 kg 77.6 kg 77.1 kg   Weight change:    Intake/Output from previous day: 03/17 0701 - 03/18 0700 In: 1080 [P.O.:1080] Out: 1100 [Urine:1100] Intake/Output this shift: Total I/O In: 240 [P.O.:240] Out: 175 [Urine:175]  Examination:  General exam: Alert awake not in distress.  Ambulating in the room. HEENT:Oral mucosa moist, Ear/Nose WNL grossly, denti  Awake, not in distress, ambulating in the room.tion normal. Respiratory system: Bilaterally clear without crackles and wheezing.  Right chest with dialysis catheter present. Cardiovascular system: S1 & S2 +, No JVD,. Gastrointestinal system: Abdomen soft, NT,ND, BS+ Nervous System:Alert, awake, moving extremities and grossly nonfocal Extremities: No edema, distal peripheral pulses palpable.  Skin: No rashes,no icterus. MSK: Normal muscle bulk,tone, power  Data Reviewed: I  have personally reviewed following labs and imaging studies CBC: Recent Labs  Lab 03/14/19 0826 03/15/19 0511 03/20/19 0411  WBC 15.1* 13.3* 12.1*  HGB 10.0* 8.9* 8.5*  HCT 31.5* 27.1* 26.4*  MCV 91.8 87.7 90.1  PLT 211 178 929   Basic Metabolic Panel: Recent Labs  Lab 03/16/19 1056 03/17/19 0410 03/18/19 0413 03/19/19 0707 03/20/19 0411  NA 141 141 141 144 143  K 3.7 3.9 4.3 4.2 3.9  CL 107 108 107 106 106  CO2 23 22 22 27 24   GLUCOSE 86 102* 94 100* 82  BUN 82* 90* 100* 66* 72*  CREATININE 6.49* 6.97* 7.18* 5.72* 6.37*  CALCIUM 7.4* 7.4* 7.7* 7.7* 7.3*  PHOS 5.5* 6.1* 5.8* 4.5 4.9*   GFR: Estimated Creatinine Clearance: 11.4 mL/min (A) (by C-G formula based on SCr of 6.37 mg/dL (H)). Liver Function Tests: Recent Labs  Lab 03/16/19 1056 03/17/19 0410 03/18/19 0413 03/19/19 0707 03/20/19 0411  ALBUMIN 2.3* 2.1* 2.1* 2.3* 2.3*   No results for input(s): LIPASE, AMYLASE in the last 168 hours. No results for input(s): AMMONIA in the last 168 hours. Coagulation Profile: No results for input(s): INR, PROTIME in the last 168 hours. Cardiac Enzymes: No results for input(s): CKTOTAL, CKMB, CKMBINDEX, TROPONINI in the last 168 hours. BNP (last 3 results) No results for input(s): PROBNP in the last 8760 hours. HbA1C: No results  for input(s): HGBA1C in the last 72 hours. CBG: Recent Labs  Lab 03/18/19 2109 03/19/19 0744 03/19/19 1126 03/19/19 1714 03/20/19 0804  GLUCAP 153* 123* 80 100* 127*   Lipid Profile: No results for input(s): CHOL, HDL, LDLCALC, TRIG, CHOLHDL, LDLDIRECT in the last 72 hours. Thyroid Function Tests: No results for input(s): TSH, T4TOTAL, FREET4, T3FREE, THYROIDAB in the last 72 hours. Anemia Panel: No results for input(s): VITAMINB12, FOLATE, FERRITIN, TIBC, IRON, RETICCTPCT in the last 72 hours. Sepsis Labs: No results for input(s): PROCALCITON, LATICACIDVEN in the last 168 hours.  No results found for this or any previous visit  (from the past 240 hour(s)).    Radiology Studies: CT CHEST WO CONTRAST  Result Date: 03/18/2019 CLINICAL DATA:  History of COPD assessing for lung involvement of ANCA. EXAM: CT CHEST WITHOUT CONTRAST TECHNIQUE: Multidetector CT imaging of the chest was performed following the standard protocol without IV contrast. COMPARISON:  None. FINDINGS: Cardiovascular: A right-sided venous catheter is in place. No significant vascular findings. Normal heart size. A trace amount of pleural fluid is seen. Marked severity coronary artery calcification is seen. Mediastinum/Nodes: Multiple small partially calcified pretracheal and bilateral hilar lymph nodes are seen. Lungs/Pleura: Mild atelectasis is seen within the bilateral lung bases. There are small bilateral pleural effusions. No pneumothorax is identified. Upper Abdomen: No acute abnormality. Musculoskeletal: No chest wall mass or suspicious bone lesions identified. IMPRESSION: 1. Small bilateral pleural effusions. 2. Mild bibasilar atelectasis. 3. Small partially calcified pretracheal and bilateral hilar lymph nodes. Electronically Signed   By: Virgina Norfolk M.D.   On: 03/18/2019 21:30     LOS: 9 days   Time spent: More than 50% of that time was spent in counseling and/or coordination of care.  Antonieta Pert, MD [facility]  03/20/2019, 11:26 AM

## 2019-03-20 NOTE — Progress Notes (Signed)
Renal Navigator following along for possible need for OP HD treatment, however, notes that placement may be difficult due to pending Medicaid application if patient's treatment need is for AKI.  Renal Navigator will continue to follow.  Alphonzo Cruise,  Renal Navigator [phone]

## 2019-03-20 NOTE — Progress Notes (Signed)
[facility] Progress Note    Assessment/ Plan:   Pt is a 59 y.o. yo male homeless smoker who was admitted on 03/10/2019 with N/V and dizziness- found to have a crt of 10- no old data   Assessment/Plan: 1. Renal-  AKI vs CKD.  Had hematuria- work up showed anti MPO markedly positive.  S/p renal biopsy on 3/12-  Got a preliminary report.  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  Felt there was salvageable kidney tissue to attempt treatment- final report pending as of 03/18/19.  Received bolus steroids for 3 days now on pred 60 daily.  Started oral cytoxan 125 mg daily on 3/10.  Required HD on 3/10 and 3/12.  Nonoliguric.  HD for clearance 3/16.  Hep and HIV negative, getting quantiferon gold--> indeterminate (? suboptimal specimen handling).  Will do PPD.  Will also check CT chest w/o contrast--> no evidence of vasculitis, some pleural effusions.  We are starting to look for AKI outpt HD chair- pt not happy about that but rate of rise plateauing so hopeful for some recovery 2. HTN/vol- no BP meds yet 3. Anemia- iron stores OK, added ESA  Subjective:    Rate of rise plateauing.  Pt threw up today.     Objective:   BP 118/70 (BP Location: Right Arm)   Pulse 71   Temp 98.8 F (37.1 C) (Oral)   Resp 18   Ht 5\' 2"  (1.575 m)   Wt 77.1 kg   SpO2 98%   BMI 31.09 kg/m   Intake/Output Summary (Last 24 hours) at 03/20/2019 1135 Last data filed at 03/20/2019 0932 Gross per 24 hour  Intake 1080 ml  Output 1275 ml  Net -195 ml   Weight change:   Physical Exam: Gen: NAD, sitting in bed CVS: RRR no m/r/g Resp: bilateral wheezing Abd: soft nontender Ext: no LE edema ACCESS: R IJ [facility]  Imaging: CT CHEST WO CONTRAST  Result Date: 03/18/2019 CLINICAL DATA:  History of COPD assessing for lung involvement of ANCA. EXAM: CT CHEST WITHOUT CONTRAST TECHNIQUE: Multidetector  CT imaging of the chest was performed following the standard protocol without IV contrast. COMPARISON:  None. FINDINGS: Cardiovascular: A right-sided venous catheter is in place. No significant vascular findings. Normal heart size. A trace amount of pleural fluid is seen. Marked severity coronary artery calcification is seen. Mediastinum/Nodes: Multiple small partially calcified pretracheal and bilateral hilar lymph nodes are seen. Lungs/Pleura: Mild atelectasis is seen within the bilateral lung bases. There are small bilateral pleural effusions. No pneumothorax is identified. Upper Abdomen: No acute abnormality. Musculoskeletal: No chest wall mass or suspicious bone lesions identified. IMPRESSION: 1. Small bilateral pleural effusions. 2. Mild bibasilar atelectasis. 3. Small partially calcified pretracheal and bilateral hilar lymph nodes. Electronically Signed   By: Virgina Norfolk M.D.   On: 03/18/2019 21:30    Labs: BMET Recent Labs  Lab 03/14/19 0826 03/15/19 0511 03/16/19 1056 03/17/19 0410 03/18/19 0413 03/19/19 0707 03/20/19 0411  NA 141 141 141 141 141 144 143  K 4.5 4.2 3.7 3.9 4.3 4.2 3.9  CL 109 105 107 108 107 106 106  CO2 20* 25 23 22 22 27 24   GLUCOSE 128* 98 86 102* 94 100* 82  BUN 103* 64* 82* 90* 100* 66* 72*  CREATININE 8.34* 5.74* 6.49* 6.97* 7.18* 5.72* 6.37*  CALCIUM 8.0* 7.7* 7.4* 7.4* 7.7* 7.7* 7.3*  PHOS  --   --  5.5* 6.1* 5.8* 4.5 4.9*   CBC Recent Labs  Lab 03/14/19 0826 03/15/19 0511 03/20/19 0411  WBC 15.1* 13.3* 12.1*  HGB 10.0* 8.9* 8.5*  HCT 31.5* 27.1* 26.4*  MCV 91.8 87.7 90.1  PLT 211 178 180    Medications:    . Chlorhexidine Gluconate Cloth  6 each Topical Q0600  . Chlorhexidine Gluconate Cloth  6 each Topical Q0600  . cyclophosphamide  125 mg Oral Daily  . darbepoetin (ARANESP) injection - NON-DIALYSIS  100 mcg Subcutaneous Q Sun-1800  . heparin injection (subcutaneous)  5,000 Units Subcutaneous Q8H  . insulin aspart  0-9 Units  Subcutaneous TID WC  . predniSONE  60 mg Oral Q breakfast  . sulfamethoxazole-trimethoprim  1 tablet Oral Daily  . tuberculin  5 Units Intradermal Once      Madelon Lips, MD 03/20/2019, 11:35 AM

## 2019-03-21 LAB — RENAL FUNCTION PANEL
Albumin: 2.5 g/dL — ABNORMAL LOW (ref 3.5–5.0)
Anion gap: 12 (ref 5–15)
BUN: 84 mg/dL — ABNORMAL HIGH (ref 6–20)
CO2: 23 mmol/L (ref 22–32)
Calcium: 7.9 mg/dL — ABNORMAL LOW (ref 8.9–10.3)
Chloride: 106 mmol/L (ref 98–111)
Creatinine, Ser: 7.2 mg/dL — ABNORMAL HIGH (ref 0.61–1.24)
GFR calc Af Amer: 9 mL/min — ABNORMAL LOW (ref >60)
GFR calc non Af Amer: 8 mL/min — ABNORMAL LOW (ref >60)
Glucose, Bld: 102 mg/dL — ABNORMAL HIGH (ref 70–99)
Phosphorus: 5.1 mg/dL — ABNORMAL HIGH (ref 2.5–4.6)
Potassium: 4 mmol/L (ref 3.5–5.1)
Sodium: 141 mmol/L (ref 135–145)

## 2019-03-21 LAB — GLUCOSE, CAPILLARY
Glucose-Capillary: 95 mg/dL (ref 70–99)
Glucose-Capillary: 86 mg/dL (ref 70–99)
Glucose-Capillary: 181 mg/dL — ABNORMAL HIGH (ref 70–99)
Glucose-Capillary: 206 mg/dL — ABNORMAL HIGH (ref 70–99)

## 2019-03-21 NOTE — Progress Notes (Signed)
  [facility] Progress Note    Assessment/ Plan:   Pt is a 59 y.o. yo male homeless smoker who was admitted on 03/10/2019 with N/V and dizziness- found to have a crt of 10- no old data   Assessment/Plan: 1. Renal-  AKI vs CKD.  Had hematuria- work up showed anti MPO markedly positive.  S/p renal biopsy on 3/12-  Got a preliminary report.  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  Felt there was salvageable kidney tissue to attempt treatment- final report pending as of 03/18/19.  Received bolus steroids for 3 days now on pred 60 daily.  Started oral cytoxan 125 mg daily on 3/10.  Required HD on 3/10 and 3/12.  Nonoliguric.  HD for clearance 3/16.  Hep and HIV negative, getting quantiferon gold--> indeterminate (? suboptimal specimen handling).  PPD placed.  Will also check CT chest w/o contrast--> no evidence of vasculitis, some pleural effusions.  We are starting to look for AKI outpt HD chair- pt not happy about that is finally agreeable- insurance is the big barrier now 2. HTN/vol- no BP meds yet 3. Anemia- iron stores OK, added ESA  Subjective:    No real improvement in renal function.  Called [facility] nephropath- no final biopsy yet.   Objective:   BP (!) 150/89 (BP Location: Left Arm)   Pulse 67   Temp 97.8 F (36.6 C) (Oral)   Resp 16   Ht 5\' 2"  (1.575 m)   Wt 78.7 kg   SpO2 98%   BMI 31.73 kg/m   Intake/Output Summary (Last 24 hours) at 03/21/2019 1646 Last data filed at 03/21/2019 1205 Gross per 24 hour  Intake 1080 ml  Output 350 ml  Net 730 ml   Weight change:   Physical Exam: Gen: NAD, sitting in bed CVS: RRR no m/r/g Resp: bilateral wheezing Abd: soft nontender Ext: no LE edema ACCESS: R IJ [facility]  Imaging: No results found.  Labs: BMET Recent Labs  Lab 03/15/19 0511 03/16/19 1056 03/17/19 0410 03/18/19 0413 03/19/19 0707 03/20/19 0411  03/21/19 0635  NA 141 141 141 141 144 143 141  K 4.2 3.7 3.9 4.3 4.2 3.9 4.0  CL 105 107 108 107 106 106 106  CO2 25 23 22 22 27 24 23   GLUCOSE 98 86 102* 94 100* 82 102*  BUN 64* 82* 90* 100* 66* 72* 84*  CREATININE 5.74* 6.49* 6.97* 7.18* 5.72* 6.37* 7.20*  CALCIUM 7.7* 7.4* 7.4* 7.7* 7.7* 7.3* 7.9*  PHOS  --  5.5* 6.1* 5.8* 4.5 4.9* 5.1*   CBC Recent Labs  Lab 03/15/19 0511 03/20/19 0411  WBC 13.3* 12.1*  HGB 8.9* 8.5*  HCT 27.1* 26.4*  MCV 87.7 90.1  PLT 178 180    Medications:    . Chlorhexidine Gluconate Cloth  6 each Topical Q0600  . Chlorhexidine Gluconate Cloth  6 each Topical Q0600  . cyclophosphamide  125 mg Oral Daily  . darbepoetin (ARANESP) injection - NON-DIALYSIS  100 mcg Subcutaneous Q Sun-1800  . heparin injection (subcutaneous)  5,000 Units Subcutaneous Q8H  . insulin aspart  0-9 Units Subcutaneous TID WC  . predniSONE  60 mg Oral Q breakfast  . sulfamethoxazole-trimethoprim  1 tablet Oral Daily  . tuberculin  5 Units Intradermal Once      Madelon Lips, MD 03/21/2019, 4:46 PM

## 2019-03-21 NOTE — Progress Notes (Signed)
PROGRESS NOTE    Gabriel Harris  INO:[id number] DOB: 1960-09-16 DOA: 03/10/2019 PCP: Patient, No Pcp Per   Brief Narrative: 59 y.o.malewithno significant past medical history except for tobacco use disorder who presented to the [facility] ED with complaints of 2 weeks of worsening dizziness, nausea, tinnitus, diplopia, spinning sensation, associated with 3 days of diarrhea and at least 5 episodes of emesis. In the ED, MRI negative for any intracranial findings but was found to have significantly elevated creatinine greater than 10.  Was started on IV fluid and nephrology was consulted.  Work-up showed concern for possible GN.  Unfortunately creatinine continues to trend up at >11 with oliguria.  Nephrology recommended transfer to [facility] for possible hemodialysis.  3/10 IR has been consulted for placement of line for HD. 3/11-had renal biopsy. 3/12- HD session for clearance no volume removal. 3/16-  S/p another HD session  Subjective:  Seen and examined.  Has been ambulating in the hallway.  No complaints. Overnight no acute events, afebrile T-max 99.  Blood pressure stable.  BUN/creatinine continues to trend up.  UOP 870 mL  Assessment & Plan:  Severe AKI with oliguria  Vs CKD with metabolic acidosis: Had hematuria and work-up showed anti-MPO markedly positive,no significant prior medical problems, hba1c 5.8, positive smoker.  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 S/p bolus steroid and now on prednisone 60 mg, cyclophosphamide 125 mg daily and Bactrim 1 tablet since 3/10. Started on HD w/  right IJ HD catheter- s/p HD 3/10, 3/12, 3/16.CT chest no acute finding.  Nephrology now looking into outpatient AKI HD, patient does not seem to be happy with rising BUN/creatinine and HAD, hopefully will have some renal recovery. Recent Labs  Lab 03/17/19 0410 03/18/19 0413 03/19/19 0707  03/20/19 0411 03/21/19 0635  BUN 90* 100* 66* 72* 84*  CREATININE 6.97* 7.18* 5.72* 6.37* 7.20*   Hyperkalemia : Improved with Lokelma and dialysis.  Monitor K  Hypertension blood pressure fairly stable.  Not needing meds.  Monitor.  Vertigo/Diplopia: Overall improved.  Unclear etiology MRI brain no acute finding.  Likely in the setting of uremia severe AKI.   Normocytic anemia: Stable. In the setting of renal failure.  Continue ESA.Hemoglobin 8.5 g.Monitor.  Leukocytosis-?Cause likely reactive/steroid.No fever.Monitor. Recent Labs  Lab 03/14/19 0826 03/15/19 0511 03/20/19 0411  WBC 15.1* 13.3* 12.1*   Sinus bradycardia-stable.  Tobacco SJG:GEZMOQHUT advised. Nutritional status low albumin-   Likely in the setting of nephrotic syndrome and CKD-augment nutrition.  DVT prophylaxis:SCD Code Status:FULL Family Communication: plan of care discussed with patient at bedside. Disposition Plan: Patient is from:HOMELESS Anticipated Disposition: TBD/homeless shelter? Barriers to discharge or conditions that needs to be met prior to discharge: patient admitted with severe acute renal failure/hyperkalemia- needing initiation of dialysis, renal biopsy and pending further stabilization signing off by nephrology.  Will be difficult disposition given lack of insurance and homelessness.pending Medicaid. Looking into outpatient AKI HD.   Consultants: NEPHRO  Procedures: Renal biopsy HD catheter placement  CT chest 1. Small bilateral pleural effusions. 2. Mild bibasilar atelectasis. 3. Small partially calcified pretracheal and bilateral hilar lymph Nodes.   Microbiology:see note  Medications: Scheduled Meds: . Chlorhexidine Gluconate Cloth  6 each Topical Q0600  . Chlorhexidine Gluconate Cloth  6 each Topical Q0600  . cyclophosphamide  125 mg Oral Daily  . darbepoetin (ARANESP) injection - NON-DIALYSIS  100 mcg Subcutaneous Q Sun-1800  . heparin injection (subcutaneous)  5,000  Units Subcutaneous Q8H  . insulin aspart  0-9 Units Subcutaneous TID WC  . predniSONE  60 mg Oral Q breakfast  . sulfamethoxazole-trimethoprim  1 tablet Oral Daily  . tuberculin  5 Units Intradermal Once   Continuous Infusions:   Antimicrobials: Anti-infectives (From admission, onward)   Start     Dose/Rate Route Frequency Ordered Stop   03/12/19 1536  ceFAZolin (ANCEF) 2-4 GM/100ML-% IVPB    Note to Pharmacy: Martin, Stephen   : cabinet override      03/12/19 1536 03/12/19 1606   03/12/19 1300  sulfamethoxazole-trimethoprim (BACTRIM) 400-80 MG per tablet 1 tablet     1 tablet Oral Daily 03/12/19 1259     03/12/19 0800  ceFAZolin (ANCEF) IVPB 2g/100 mL premix     2 g 200 mL/hr over 30 Minutes Intravenous  Once 03/11/19 1615 03/12/19 1610       Objective: Vitals: Today's Vitals   03/20/19 0932 03/20/19 1620 03/20/19 2055 03/21/19 0738  BP: 118/70 (!) 144/83 137/79   Pulse: 71 (!) 58 63   Resp: 18 18 15   Temp: 98.8 F (37.1 C) 98.4 F (36.9 C) 99 F (37.2 C)   TempSrc: Oral Oral Oral   SpO2: 98% 95% 96%   Weight:   78.7 kg   Height:      PainSc:   0-No pain 0-No pain    Intake/Output Summary (Last 24 hours) at 03/21/2019 0824 Last data filed at 03/21/2019 0738 Gross per 24 hour  Intake 1320 ml  Output 870 ml  Net 450 ml   Filed Weights   03/18/19 1030 03/18/19 1330 03/20/19 2055  Weight: 77.6 kg 77.1 kg 78.7 kg   Weight change:    Intake/Output from previous day: 03/18 0701 - 03/19 0700 In: 1080 [P.O.:1080] Out: 870 [Urine:870] Intake/Output this shift: Total I/O In: 240 [P.O.:240] Out: -   Examination:  General exam: AAO X3, NAD, on room air. HEENT:Oral mucosa moist, Ear/Nose WNL grossly, denti  Awake, not in distress, ambulating in the room.tion normal. Respiratory system: Bilaterally clear, right IJ HD catheter present.  Cardiovascular system: S1 & S2 +, No JVD,. Gastrointestinal system: Abdomen soft, NT,ND, BS+ Nervous System:Alert, awake,  moving extremities and grossly nonfocal Extremities: No edema, distal peripheral pulses palpable.  Skin: No rashes,no icterus. MSK: Normal muscle bulk,tone, power  Data Reviewed: I have personally reviewed following labs and imaging studies CBC: Recent Labs  Lab 03/14/19 0826 03/15/19 0511 03/20/19 0411  WBC 15.1* 13.3* 12.1*  HGB 10.0* 8.9* 8.5*  HCT 31.5* 27.1* 26.4*  MCV 91.8 87.7 90.1  PLT 211 178 180   Basic Metabolic Panel: Recent Labs  Lab 03/17/19 0410 03/18/19 0413 03/19/19 0707 03/20/19 0411 03/21/19 0635  NA 141 141 144 143 141  K 3.9 4.3 4.2 3.9 4.0  CL 108 107 106 106 106  CO2 22 22 27 24 23  GLUCOSE 102* 94 100* 82 102*  BUN 90* 100* 66* 72* 84*  CREATININE 6.97* 7.18* 5.72* 6.37* 7.20*  CALCIUM 7.4* 7.7* 7.7* 7.3* 7.9*  PHOS 6.1* 5.8* 4.5 4.9* 5.1*   GFR: Estimated Creatinine Clearance: 10.2 mL/min (A) (by C-G formula based on SCr of 7.2 mg/dL (H)). Liver Function Tests: Recent Labs  Lab 03/17/19 0410 03/18/19 0413 03/19/19 0707 03/20/19 0411 03/21/19 0635  ALBUMIN 2.1* 2.1* 2.3* 2.3* 2.5*   No results for input(s): LIPASE, AMYLASE in the last 168 hours. No results for input(s): AMMONIA in the last 168 hours. Coagulation Profile: No   results for input(s): INR, PROTIME in the last 168 hours. Cardiac Enzymes: No results for input(s): CKTOTAL, CKMB, CKMBINDEX, TROPONINI in the last 168 hours. BNP (last 3 results) No results for input(s): PROBNP in the last 8760 hours. HbA1C: No results for input(s): HGBA1C in the last 72 hours. CBG: Recent Labs  Lab 03/20/19 0804 03/20/19 1134 03/20/19 1719 03/20/19 2057 03/21/19 0645  GLUCAP 127* 102* 168* 145* 95   Lipid Profile: No results for input(s): CHOL, HDL, LDLCALC, TRIG, CHOLHDL, LDLDIRECT in the last 72 hours. Thyroid Function Tests: No results for input(s): TSH, T4TOTAL, FREET4, T3FREE, THYROIDAB in the last 72 hours. Anemia Panel: No results for input(s): VITAMINB12, FOLATE, FERRITIN,  TIBC, IRON, RETICCTPCT in the last 72 hours. Sepsis Labs: No results for input(s): PROCALCITON, LATICACIDVEN in the last 168 hours.  No results found for this or any previous visit (from the past 240 hour(s)).    Radiology Studies: No results found.   LOS: 10 days   Time spent: More than 50% of that time was spent in counseling and/or coordination of care.  Ramesh KC, MD [facility]  03/21/2019, 8:24 AM   

## 2019-03-22 DIAGNOSIS — D649 Anemia, unspecified: Secondary | ICD-10-CM

## 2019-03-22 DIAGNOSIS — R42 Dizziness and giddiness: Secondary | ICD-10-CM

## 2019-03-22 DIAGNOSIS — Z720 Tobacco use: Secondary | ICD-10-CM

## 2019-03-22 DIAGNOSIS — E8809 Other disorders of plasma-protein metabolism, not elsewhere classified: Secondary | ICD-10-CM

## 2019-03-22 DIAGNOSIS — E875 Hyperkalemia: Secondary | ICD-10-CM

## 2019-03-22 DIAGNOSIS — H532 Diplopia: Secondary | ICD-10-CM

## 2019-03-22 LAB — RENAL FUNCTION PANEL
Albumin: 2.4 g/dL — ABNORMAL LOW (ref 3.5–5.0)
Anion gap: 14 (ref 5–15)
BUN: 93 mg/dL — ABNORMAL HIGH (ref 6–20)
CO2: 21 mmol/L — ABNORMAL LOW (ref 22–32)
Calcium: 8 mg/dL — ABNORMAL LOW (ref 8.9–10.3)
Chloride: 105 mmol/L (ref 98–111)
Creatinine, Ser: 7.79 mg/dL — ABNORMAL HIGH (ref 0.61–1.24)
GFR calc Af Amer: 8 mL/min — ABNORMAL LOW (ref >60)
GFR calc non Af Amer: 7 mL/min — ABNORMAL LOW (ref >60)
Glucose, Bld: 95 mg/dL (ref 70–99)
Phosphorus: 5.6 mg/dL — ABNORMAL HIGH (ref 2.5–4.6)
Potassium: 4.2 mmol/L (ref 3.5–5.1)
Sodium: 140 mmol/L (ref 135–145)

## 2019-03-22 LAB — GLUCOSE, CAPILLARY
Glucose-Capillary: 84 mg/dL (ref 70–99)
Glucose-Capillary: 144 mg/dL — ABNORMAL HIGH (ref 70–99)
Glucose-Capillary: 153 mg/dL — ABNORMAL HIGH (ref 70–99)
Glucose-Capillary: 160 mg/dL — ABNORMAL HIGH (ref 70–99)

## 2019-03-22 NOTE — Progress Notes (Signed)
[facility] Progress Note    Assessment/ Plan:   Pt is a 59 y.o. yo male homeless smoker who was admitted on 03/10/2019 with N/V and dizziness- found to have a crt of 10- no old data   Assessment/Plan: 1. Renal-  AKI vs CKD.  Had hematuria- work up showed anti MPO markedly positive.  S/p renal biopsy on 3/12-  Got a preliminary report.  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  Felt there was salvageable kidney tissue to attempt treatment- final report pending as of 03/18/19.  Received bolus steroids for 3 days now on pred 60 daily.  Started oral cytoxan 125 mg daily on 3/10.  Required HD on 3/10 and 3/12.  Nonoliguric.  HD for clearance 3/16.  Hep and HIV negative, getting quantiferon gold--> indeterminate (? suboptimal specimen handling).  PPD placed 3/18.  Will also check CT chest w/o contrast--> no evidence of vasculitis, some pleural effusions.  We are starting to look for AKI outpt HD chair- pt not happy about that is finally agreeable- insurance is the big barrier now.  No indication for HD today- seems like he needs some.  Will plan for Monday.  I called [facility] yesterday, no final bx report yet. 2. HTN/vol- no BP meds yet 3. Anemia- iron stores OK, added ESA  Subjective:    Cr up trending- discussed with pt.  No renal recovery yet- he is amenable to doing dialysis.  Feels OK today.     Objective:   BP 138/85 (BP Location: Right Arm)   Pulse 66   Temp 98.4 F (36.9 C) (Oral)   Resp 18   Ht 5\' 2"  (1.575 m)   Wt 78.6 kg   SpO2 97%   BMI 31.69 kg/m   Intake/Output Summary (Last 24 hours) at 03/22/2019 1054 Last data filed at 03/22/2019 0600 Gross per 24 hour  Intake 660 ml  Output 590 ml  Net 70 ml   Weight change: -0.1 kg  Physical Exam: Gen: NAD, sitting in bed CVS: RRR no m/r/g Resp: bilateral wheezing Abd: soft nontender Ext: no LE  edema ACCESS: R IJ TDC  Imaging: No results found.  Labs: BMET Recent Labs  Lab 03/16/19 1056 03/17/19 0410 03/18/19 0413 03/19/19 0707 03/20/19 0411 03/21/19 0635 03/22/19 0440  NA 141 141 141 144 143 141 140  K 3.7 3.9 4.3 4.2 3.9 4.0 4.2  CL 107 108 107 106 106 106 105  CO2 23 22 22 27 24 23  21*  GLUCOSE 86 102* 94 100* 82 102* 95  BUN 82* 90* 100* 66* 72* 84* 93*  CREATININE 6.49* 6.97* 7.18* 5.72* 6.37* 7.20* 7.79*  CALCIUM 7.4* 7.4* 7.7* 7.7* 7.3* 7.9* 8.0*  PHOS 5.5* 6.1* 5.8* 4.5 4.9* 5.1* 5.6*   CBC Recent Labs  Lab 03/20/19 0411  WBC 12.1*  HGB 8.5*  HCT 26.4*  MCV 90.1  PLT 180    Medications:    . Chlorhexidine Gluconate Cloth  6 each Topical Q0600  . Chlorhexidine Gluconate Cloth  6 each Topical Q0600  . cyclophosphamide  125 mg Oral Daily  . darbepoetin (ARANESP) injection - NON-DIALYSIS  100 mcg Subcutaneous Q Sun-1800  . heparin injection (subcutaneous)  5,000 Units Subcutaneous Q8H  . insulin aspart  0-9 Units Subcutaneous TID WC  . predniSONE  60 mg Oral Q breakfast  . sulfamethoxazole-trimethoprim  1 tablet Oral Daily  .  tuberculin  5 Units Intradermal Once      Madelon Lips, MD 03/22/2019, 10:54 AM

## 2019-03-22 NOTE — Progress Notes (Signed)
PROGRESS NOTE    Gabriel Harris  IWP:[id number] DOB: [birth year] DOA: 03/10/2019 PCP: Patient, No Pcp Per   Brief Narrative:  Per admitting MD: 59 y.o.malewithnosignificant past medical history except for tobacco use disorder who presented to [facility] ED with complaints of 2 weeks of worsening dizziness,nausea, tinnitus, diplopia, spinning sensation, associated with3 days of diarrhea andat least 5 episodes of emesis.In the ED,MRI negative for any intracranial findings but was found to have significantly elevated creatinine greater than 10.Wasstartedon IV fluid and nephrology was consulted. Work-up showed concern for possible GN.Unfortunately creatinine continues to trend up at >11with oliguria.Nephrology recommended transfer to [facility] for possible hemodialysis.   3/10 IR has been consulted for placement of line for HD. 3/11-had renal biopsy. 3/12- HD session for clearance no volume removal. 3/16-  S/p another HD session 3/20 [facility] plans for HD monday   Assessment & Plan:   Principal Problem:   AKI (acute kidney injury) ([place]) Active Problems:   Hyperkalemia   Normocytic anemia   Hypoalbuminemia   Sinus bradycardia   Tobacco use   Vertigo   Diplopia   Severe AKI with oliguria  Vs CKD with metabolic acidosis: Had hematuria and work-up showed anti-MPO markedly positive,no significant prior medical problems, hba1c 5.8, positive smoker.  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 S/p bolus steroid and now on prednisone 60 mg, cyclophosphamide 125 mg daily and Bactrim 1 tablet since 3/10. Started on HD w/  right IJ HD catheter- s/p HD 3/10, 3/12, 3/16.CT chest no acute finding.  Nephrology now looking into outpatient AKI HD,  nephrology with plans for hemodialysis Monday  Hyperkalemia : Improved with Lokelma and dialysis.  Monitoring K  Hypertension blood pressure  fairly stable.  Not needing meds.  Monitor.  Vertigo/Diplopia: No changes, overall improved.  Unclear etiology MRI brain no acute finding.  Likely in the setting of uremia severe AKI.   Normocytic anemia: Stable. In the setting of renal failure.  Continue ESA.Hemoglobin 8.5 g.Monitor.  Leukocytosis-Cause likely reactive/steroid.No fever.Monitor.  Sinus bradycardia-stable.  Tobacco NKN:LZJQBHALP advised.  Nutritional status low albumin-   Likely in the setting of nephrotic syndrome and CKD-augment nutrition.   DVT prophylaxis: SCD/Compression stockings  Code Status: Full    Code Status Orders  (From admission, onward)         Start     Ordered   03/10/19 1007  Full code  Continuous     03/10/19 1009        Code Status History    This patient has a current code status but no historical code status.   Advance Care Planning Activity     Family Communication: Discussed with patient at bedside Disposition Plan:   Patient is homeless uninsured, multiple barriers to discharge.  No current safe discharge plan. Consults called: None Admission status: Inpatient   Consultants:   Nephrology  Procedures:  DG Ankle Complete Right  Result Date: 03/10/2019 CLINICAL DATA:  Progressive pain EXAM: RIGHT ANKLE - COMPLETE 3+ VIEW COMPARISON:  None. FINDINGS: Frontal, oblique, and lateral views were obtained. No evident fracture or joint effusion. No appreciable joint space narrowing or erosion. Ankle mortise appears intact. There are multiple foci of arterial vascular calcification. IMPRESSION: No evident fracture or appreciable arthropathy. Ankle mortise appears intact. There are foci of arterial vascular calcification Electronically Signed   By: Lowella Grip III M.D.   On: 03/10/2019 08:35   CT CHEST WO CONTRAST  Result Date: 03/18/2019 CLINICAL DATA:  History of COPD assessing for lung involvement of ANCA. EXAM: CT CHEST WITHOUT CONTRAST TECHNIQUE: Multidetector CT imaging of  the chest was performed following the standard protocol without IV contrast. COMPARISON:  None. FINDINGS: Cardiovascular: A right-sided venous catheter is in place. No significant vascular findings. Normal heart size. A trace amount of pleural fluid is seen. Marked severity coronary artery calcification is seen. Mediastinum/Nodes: Multiple small partially calcified pretracheal and bilateral hilar lymph nodes are seen. Lungs/Pleura: Mild atelectasis is seen within the bilateral lung bases. There are small bilateral pleural effusions. No pneumothorax is identified. Upper Abdomen: No acute abnormality. Musculoskeletal: No chest wall mass or suspicious bone lesions identified. IMPRESSION: 1. Small bilateral pleural effusions. 2. Mild bibasilar atelectasis. 3. Small partially calcified pretracheal and bilateral hilar lymph nodes. Electronically Signed   By: Virgina Norfolk M.D.   On: 03/18/2019 21:30   MR BRAIN WO CONTRAST  Result Date: 03/10/2019 CLINICAL DATA:  Subacute neuro deficit.  Diarrhea and vomiting EXAM: MRI HEAD WITHOUT CONTRAST TECHNIQUE: Multiplanar, multiecho pulse sequences of the brain and surrounding structures were obtained without intravenous contrast. COMPARISON:  CT head 11/30/2004 FINDINGS: Brain: Ventricle size and cerebral volume normal. Negative for acute infarct. Scattered small white matter hyperintensities bilaterally. Mild hyperintensity in the pons right greater than left. Negative for hemorrhage or mass. No midline shift. Vascular: Normal arterial flow voids Skull and upper cervical spine: No focal skeletal lesion. Sinuses/Orbits: Mild mucosal edema paranasal sinuses and mastoid sinus bilaterally. Negative orbit Other: None IMPRESSION: No acute abnormality. Mild chronic microvascular ischemic change in the white matter. Electronically Signed   By: Franchot Gallo M.D.   On: 03/10/2019 14:04   US Renal  Result Date: 03/10/2019 CLINICAL DATA:  New onset renal failure. EXAM: RENAL /  URINARY TRACT ULTRASOUND COMPLETE COMPARISON:  None. FINDINGS: Right Kidney: Renal measurements: 11.1 x 4.1 x 5.0 cm = volume: 120 mL. Increased parenchymal echogenicity. No mass, stone or hydronephrosis. Left Kidney: Renal measurements: 11.6 x 5.6 x 5.1 cm = volume: 172 mL. Increased parenchymal echogenicity. Medial midpole cyst measuring 1.7 x 1.3 x 1.4 cm. No other masses, no stones and no hydronephrosis. Bladder: Minimally distended.  Otherwise unremarkable. Other: None. IMPRESSION: 1. Bilateral increased renal parenchymal echogenicity consistent with medical renal disease. 2. No hydronephrosis.  No acute finding. 3. 1.7 cm left renal cyst. Electronically Signed   By: Lajean Manes M.D.   On: 03/10/2019 11:38   IR Fluoro Guide CV Line Right  Result Date: 03/12/2019 INDICATION: 59 year old with renal failure and starting hemodialysis. Hemodialysis catheter is needed. EXAM: FLUOROSCOPIC AND ULTRASOUND GUIDED PLACEMENT OF A TUNNELED DIALYSIS CATHETER Physician: Stephan Minister. Anselm Pancoast, MD MEDICATIONS: Ancef 2 g; The antibiotic was administered within an appropriate time interval prior to skin puncture. ANESTHESIA/SEDATION: Versed 1.0 mg IV; Fentanyl 50 mcg IV; Moderate Sedation Time:  20 minutes The patient was continuously monitored during the procedure by the interventional radiology nurse under my direct supervision. FLUOROSCOPY TIME:  Fluoroscopy Time: 48 seconds, 45.8 mGy COMPLICATIONS: None immediate. PROCEDURE: The procedure was explained to the patient. The risks and benefits of the procedure were discussed and the patient's questions were addressed. Informed consent was obtained from the patient. The patient was placed supine on the interventional table. Ultrasound confirmed a patent right internal jugular vein. Ultrasound image was obtained for documentation. The right neck and chest was prepped and draped in a sterile fashion. The right neck was anesthetized with 1% lidocaine. Maximal barrier sterile technique  was utilized including caps, mask, sterile gowns, sterile gloves, sterile drape, hand hygiene and skin antiseptic. A small incision was made with #11 blade scalpel. A 21 gauge needle directed into the right internal jugular vein vein with ultrasound guidance. A micropuncture dilator set was placed. A 23 cm tip to cuff Palindrome catheter was selected. The skin below the right clavicle was anesthetized and a small incision was made with an #11 blade scalpel. A subcutaneous tunnel was formed to the vein dermatotomy site. The catheter was brought through the tunnel. The vein dermatotomy site was dilated to accommodate a peel-away sheath. The catheter was placed through the peel-away sheath and directed into the central venous structures. The tip of the catheter was placed at the SVC and right atrium junction with fluoroscopy. Fluoroscopic images were obtained for documentation. Both lumens were found to aspirate and flush well. The proper amount of heparin was flushed in both lumens. The vein dermatotomy site was closed using a single layer of absorbable suture and Dermabond. Gel-Foam placed in the subcutaneous tract. The catheter was secured to the skin using Prolene suture. IMPRESSION: Successful placement of a right jugular tunneled dialysis catheter using ultrasound and fluoroscopic guidance. Electronically Signed   By: Markus Daft M.D.   On: 03/12/2019 18:06   IR US Guide Vasc Access Right  Result Date: 03/12/2019 INDICATION: 59 year old with renal failure and starting hemodialysis. Hemodialysis catheter is needed. EXAM: FLUOROSCOPIC AND ULTRASOUND GUIDED PLACEMENT OF A TUNNELED DIALYSIS CATHETER Physician: Stephan Minister. Anselm Pancoast, MD MEDICATIONS: Ancef 2 g; The antibiotic was administered within an appropriate time interval prior to skin puncture. ANESTHESIA/SEDATION: Versed 1.0 mg IV; Fentanyl 50 mcg IV; Moderate Sedation Time:  20 minutes The patient was continuously monitored during the procedure by the interventional  radiology nurse under my direct supervision. FLUOROSCOPY TIME:  Fluoroscopy Time: 48 seconds, 97.6 mGy COMPLICATIONS: None immediate. PROCEDURE: The procedure was explained to the patient. The risks and benefits of the procedure were discussed and the patient's questions were addressed. Informed consent was obtained from the patient. The patient was placed supine on the interventional table. Ultrasound confirmed a patent right internal jugular vein. Ultrasound image was obtained for documentation. The right neck and chest was prepped and draped in a sterile fashion. The right neck was anesthetized with 1% lidocaine. Maximal barrier sterile technique was utilized including caps, mask, sterile gowns, sterile gloves, sterile drape, hand hygiene and skin antiseptic. A small incision was made with #11 blade scalpel. A 21 gauge needle directed into the right internal jugular vein vein with ultrasound guidance. A micropuncture dilator set was placed. A 23 cm tip to cuff Palindrome catheter was selected. The skin below the right clavicle was anesthetized and a small incision was made with an #11 blade scalpel. A subcutaneous tunnel was formed to the vein dermatotomy site. The catheter was brought through the tunnel. The vein dermatotomy site was dilated to accommodate a peel-away sheath. The catheter was placed through the peel-away sheath and directed into the central venous structures. The tip of the catheter was placed at the SVC and right atrium junction with fluoroscopy. Fluoroscopic images were obtained for documentation. Both lumens were found to aspirate and flush well. The proper amount of heparin was flushed in both lumens. The vein dermatotomy site was closed using a single layer of absorbable suture and Dermabond. Gel-Foam placed in the subcutaneous tract. The catheter was secured to the skin using Prolene suture. IMPRESSION: Successful placement of a right jugular tunneled dialysis catheter using  ultrasound and  fluoroscopic guidance. Electronically Signed   By: Markus Daft M.D.   On: 03/12/2019 18:06   US BIOPSY (KIDNEY)  Result Date: 03/13/2019 INDICATION: Renal failure of uncertain etiology. Please perform ultrasound-guided random renal biopsy for tissue diagnostic purposes. EXAM: ULTRASOUND GUIDED RENAL BIOPSY COMPARISON:  Renal ultrasound-03/10/2019 MEDICATIONS: None. ANESTHESIA/SEDATION: Fentanyl 50 mcg IV; Versed 1 mg IV Total Moderate Sedation time: 10 minutes; The patient was continuously monitored during the procedure by the interventional radiology nurse under my direct supervision. COMPLICATIONS: None immediate. PROCEDURE: Informed written consent was obtained from the patient after a discussion of the risks, benefits and alternatives to treatment. The patient understands and consents the procedure. A timeout was performed prior to the initiation of the procedure. Ultrasound scanning was performed of the bilateral flanks. The inferior pole of the left kidney was selected for biopsy due to location and sonographic window. The procedure was planned. The operative site was prepped and draped in the usual sterile fashion. The overlying soft tissues were anesthetized with 1% lidocaine with epinephrine. A 17 gauge core needle biopsy device was advanced into the inferior cortex of the left kidney and 3 core biopsies were obtained under direct ultrasound guidance. Images were saved for documentation purposes. The biopsy device was removed and hemostasis was obtained with manual compression. Post procedural scanning was negative for significant post procedural hemorrhage or additional complication. A dressing was placed. The patient tolerated the procedure well without immediate post procedural complication. IMPRESSION: Technically successful ultrasound guided left renal biopsy. Electronically Signed   By: Sandi Mariscal M.D.   On: 03/13/2019 09:36     Antimicrobials:   Bactrim 3/10   Subjective: No acute  events overnight Awake alert oriented answering questions appropriately no evidence of uremia  Objective: Vitals:   03/21/19 2026 03/22/19 0500 03/22/19 0504 03/22/19 0916  BP: (!) 174/99  (!) 161/92 138/85  Pulse: 67  (!) 56 66  Resp: 18   18  Temp: 98.6 F (37 C)  97.8 F (36.6 C) 98.4 F (36.9 C)  TempSrc: Oral  Oral Oral  SpO2: 97%  98% 97%  Weight: 78.6 kg 78.6 kg    Height:        Intake/Output Summary (Last 24 hours) at 03/22/2019 1343 Last data filed at 03/22/2019 0600 Gross per 24 hour  Intake 420 ml  Output 490 ml  Net -70 ml   Filed Weights   03/20/19 2055 03/21/19 2026 03/22/19 0500  Weight: 78.7 kg 78.6 kg 78.6 kg    Examination:  General exam: Appears calm and comfortable  Respiratory system: Clear to auscultation. Respiratory effort normal. Cardiovascular system: S1 & S2 heard, RRR. No JVD, murmurs, rubs, gallops or clicks. No pedal edema. Gastrointestinal system: Abdomen is nondistended, soft and nontender. No organomegaly or masses felt. Normal bowel sounds heard. Central nervous system: Alert and oriented. No focal neurological deficits. Extremities: Warm well perfused, trace bilateral lower extremity edema Skin: No rashes, lesions or ulcers Psychiatry: Judgement and insight appear normal. Mood & affect appropriate.     Data Reviewed: I have personally reviewed following labs and imaging studies  CBC: Recent Labs  Lab 03/20/19 0411  WBC 12.1*  HGB 8.5*  HCT 26.4*  MCV 90.1  PLT 540   Basic Metabolic Panel: Recent Labs  Lab 03/18/19 0413 03/19/19 0707 03/20/19 0411 03/21/19 0635 03/22/19 0440  NA 141 144 143 141 140  K 4.3 4.2 3.9 4.0 4.2  CL 107 106 106 106 105  CO2 22  27 24 23  21*  GLUCOSE 94 100* 82 102* 95  BUN 100* 66* 72* 84* 93*  CREATININE 7.18* 5.72* 6.37* 7.20* 7.79*  CALCIUM 7.7* 7.7* 7.3* 7.9* 8.0*  PHOS 5.8* 4.5 4.9* 5.1* 5.6*   GFR: Estimated Creatinine Clearance: 9.4 mL/min (A) (by C-G formula based on SCr of  7.79 mg/dL (H)). Liver Function Tests: Recent Labs  Lab 03/18/19 0413 03/19/19 0707 03/20/19 0411 03/21/19 0635 03/22/19 0440  ALBUMIN 2.1* 2.3* 2.3* 2.5* 2.4*   No results for input(s): LIPASE, AMYLASE in the last 168 hours. No results for input(s): AMMONIA in the last 168 hours. Coagulation Profile: No results for input(s): INR, PROTIME in the last 168 hours. Cardiac Enzymes: No results for input(s): CKTOTAL, CKMB, CKMBINDEX, TROPONINI in the last 168 hours. BNP (last 3 results) No results for input(s): PROBNP in the last 8760 hours. HbA1C: No results for input(s): HGBA1C in the last 72 hours. CBG: Recent Labs  Lab 03/21/19 1131 03/21/19 1649 03/21/19 2023 03/22/19 0639 03/22/19 1303  GLUCAP 86 181* 206* 84 144*   Lipid Profile: No results for input(s): CHOL, HDL, LDLCALC, TRIG, CHOLHDL, LDLDIRECT in the last 72 hours. Thyroid Function Tests: No results for input(s): TSH, T4TOTAL, FREET4, T3FREE, THYROIDAB in the last 72 hours. Anemia Panel: No results for input(s): VITAMINB12, FOLATE, FERRITIN, TIBC, IRON, RETICCTPCT in the last 72 hours. Sepsis Labs: No results for input(s): PROCALCITON, LATICACIDVEN in the last 168 hours.  No results found for this or any previous visit (from the past 240 hour(s)).       Radiology Studies: No results found.      Scheduled Meds: . Chlorhexidine Gluconate Cloth  6 each Topical Q0600  . Chlorhexidine Gluconate Cloth  6 each Topical Q0600  . cyclophosphamide  125 mg Oral Daily  . darbepoetin (ARANESP) injection - NON-DIALYSIS  100 mcg Subcutaneous Q Sun-1800  . heparin injection (subcutaneous)  5,000 Units Subcutaneous Q8H  . insulin aspart  0-9 Units Subcutaneous TID WC  . predniSONE  60 mg Oral Q breakfast  . sulfamethoxazole-trimethoprim  1 tablet Oral Daily  . tuberculin  5 Units Intradermal Once   Continuous Infusions:   LOS: 11 days    Time spent: 35 min    Nicolette Bang, MD [facility]  If 7PM-7AM, please contact night-coverage  03/22/2019, 1:43 PM

## 2019-03-23 LAB — CBC
HCT: 25.6 % — ABNORMAL LOW (ref 39.0–52.0)
Hemoglobin: 8.4 g/dL — ABNORMAL LOW (ref 13.0–17.0)
MCH: 29.4 pg (ref 26.0–34.0)
MCHC: 32.8 g/dL (ref 30.0–36.0)
MCV: 89.5 fL (ref 80.0–100.0)
Platelets: 224 10*3/uL (ref 150–400)
RBC: 2.86 MIL/uL — ABNORMAL LOW (ref 4.22–5.81)
RDW: 13.1 % (ref 11.5–15.5)
WBC: 15.3 10*3/uL — ABNORMAL HIGH (ref 4.0–10.5)
nRBC: 0.2 % (ref 0.0–0.2)

## 2019-03-23 LAB — RENAL FUNCTION PANEL
Albumin: 2.4 g/dL — ABNORMAL LOW (ref 3.5–5.0)
Anion gap: 12 (ref 5–15)
BUN: 93 mg/dL — ABNORMAL HIGH (ref 6–20)
CO2: 21 mmol/L — ABNORMAL LOW (ref 22–32)
Calcium: 7.9 mg/dL — ABNORMAL LOW (ref 8.9–10.3)
Chloride: 108 mmol/L (ref 98–111)
Creatinine, Ser: 7.63 mg/dL — ABNORMAL HIGH (ref 0.61–1.24)
GFR calc Af Amer: 8 mL/min — ABNORMAL LOW (ref >60)
GFR calc non Af Amer: 7 mL/min — ABNORMAL LOW (ref >60)
Glucose, Bld: 120 mg/dL — ABNORMAL HIGH (ref 70–99)
Phosphorus: 5.8 mg/dL — ABNORMAL HIGH (ref 2.5–4.6)
Potassium: 4.5 mmol/L (ref 3.5–5.1)
Sodium: 141 mmol/L (ref 135–145)

## 2019-03-23 LAB — GLUCOSE, CAPILLARY
Glucose-Capillary: 75 mg/dL (ref 70–99)
Glucose-Capillary: 103 mg/dL — ABNORMAL HIGH (ref 70–99)
Glucose-Capillary: 125 mg/dL — ABNORMAL HIGH (ref 70–99)
Glucose-Capillary: 128 mg/dL — ABNORMAL HIGH (ref 70–99)

## 2019-03-23 MED ORDER — AMLODIPINE BESYLATE 5 MG PO TABS
5.0000 mg | ORAL_TABLET | Freq: Every day | ORAL | Status: DC
  Administered 2019-03-23 – 2019-03-26 (×4): 5 mg via ORAL
  Filled 2019-03-23 (×3): qty 1

## 2019-03-23 NOTE — Progress Notes (Signed)
PROGRESS NOTE    Gabriel Harris  BSJ:[id number] DOB: 11-Sep-1960 DOA: 03/10/2019 PCP: Patient, No Pcp Per   Brief Narrative:  Per admitting MD: 59 y.o.malewithnosignificant past medical history except for tobacco use disorder who presented to [facility] ED with complaints of 2 weeks of worsening dizziness,nausea, tinnitus, diplopia, spinning sensation, associated with3 days of diarrhea andat least 5 episodes of emesis.In the ED,MRI negative for any intracranial findings but was found to have significantly elevated creatinine greater than 10.Wasstartedon IV fluid and nephrology was consulted. Work-up showed concern for possible GN.Unfortunately creatinine continues to trend up at >11with oliguria.Nephrology recommended transfer to [facility] for possible hemodialysis.  3/10 IR has been consulted for placement of line for HD. 3/11-had renal biopsy. 3/12- HD session for clearance no volume removal. 3/16- S/p another HD session 3/20 [facility] plans for HD monday  Assessment & Plan:   Principal Problem:   AKI (acute kidney injury) ([place]) Active Problems:   Hyperkalemia   Normocytic anemia   Hypoalbuminemia   Sinus bradycardia   Tobacco use   Vertigo   Diplopia   Severe AKI with oliguria Vs CKD with metabolic acidosis:Had hematuria and work-up showed anti-MPO markedly positive,no significant prior medical problems, hba1c 5.8, positive smoker. 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 S/p bolus steroid and now on prednisone 60 mg, cyclophosphamide 125 mg daily and Bactrim 1 tablet since 3/10. Started on HD w/ right IJ HD catheter- s/p HD 3/10, 3/12, 3/16.CT chest no acute finding. Nephrology now looking into outpatient AKI HD, nephrology with plans for hemodialysis Monday  Hyperkalemia: Improved with Lokelma and dialysis. Monitoring K  Hypertensionblood pressure fairly  stable. Not needing meds. Monitor.  Vertigo/Diplopia: No changes, overall improved. Unclear etiology MRI brain no acute finding. Likely in the setting of uremia severe AKI.   Normocytic anemia: Stable. In the setting of renal failure. Continue ESA.Hemoglobin 8.4 g.Monitor.  Leukocytosis-Cause likely reactive/steroid.No fever.Monitor.  Sinus bradycardia-stable.  Tobacco TML:[id number] advised.  Nutritional status lowalbumin-Likely in the setting of nephrotic syndrome and CKD-augment nutrition.  DVT prophylaxis: SCD/Compression stockings  Code Status: FULL    Code Status Orders  (From admission, onward)         Start     Ordered   03/10/19 1007  Full code  Continuous     03/10/19 1009        Code Status History    This patient has a current code status but no historical code status.   Advance Care Planning Activity     Family Communication: Discussed with sister, answered all questions Disposition Plan:   Unfortunately is homeless and uninsured, multiple barriers to discharge, still needing further evaluation with subspecialty support from nephrology.  Not yet stable for discharge Consults called: None Admission status: Inpatient   Consultants:   Nephrology  Procedures:  DG Ankle Complete Right  Result Date: 03/10/2019 CLINICAL DATA:  Progressive pain EXAM: RIGHT ANKLE - COMPLETE 3+ VIEW COMPARISON:  None. FINDINGS: Frontal, oblique, and lateral views were obtained. No evident fracture or joint effusion. No appreciable joint space narrowing or erosion. Ankle mortise appears intact. There are multiple foci of arterial vascular calcification. IMPRESSION: No evident fracture or appreciable arthropathy. Ankle mortise appears intact. There are foci of arterial vascular calcification Electronically Signed   By: Lowella Grip III M.D.   On: 03/10/2019 08:35   CT CHEST WO CONTRAST  Result Date: 03/18/2019 CLINICAL DATA:  History of COPD assessing  for lung  involvement of ANCA. EXAM: CT CHEST WITHOUT CONTRAST TECHNIQUE: Multidetector CT imaging of the chest was performed following the standard protocol without IV contrast. COMPARISON:  None. FINDINGS: Cardiovascular: A right-sided venous catheter is in place. No significant vascular findings. Normal heart size. A trace amount of pleural fluid is seen. Marked severity coronary artery calcification is seen. Mediastinum/Nodes: Multiple small partially calcified pretracheal and bilateral hilar lymph nodes are seen. Lungs/Pleura: Mild atelectasis is seen within the bilateral lung bases. There are small bilateral pleural effusions. No pneumothorax is identified. Upper Abdomen: No acute abnormality. Musculoskeletal: No chest wall mass or suspicious bone lesions identified. IMPRESSION: 1. Small bilateral pleural effusions. 2. Mild bibasilar atelectasis. 3. Small partially calcified pretracheal and bilateral hilar lymph nodes. Electronically Signed   By: Virgina Norfolk M.D.   On: 03/18/2019 21:30   MR BRAIN WO CONTRAST  Result Date: 03/10/2019 CLINICAL DATA:  Subacute neuro deficit.  Diarrhea and vomiting EXAM: MRI HEAD WITHOUT CONTRAST TECHNIQUE: Multiplanar, multiecho pulse sequences of the brain and surrounding structures were obtained without intravenous contrast. COMPARISON:  CT head 11/30/2004 FINDINGS: Brain: Ventricle size and cerebral volume normal. Negative for acute infarct. Scattered small white matter hyperintensities bilaterally. Mild hyperintensity in the pons right greater than left. Negative for hemorrhage or mass. No midline shift. Vascular: Normal arterial flow voids Skull and upper cervical spine: No focal skeletal lesion. Sinuses/Orbits: Mild mucosal edema paranasal sinuses and mastoid sinus bilaterally. Negative orbit Other: None IMPRESSION: No acute abnormality. Mild chronic microvascular ischemic change in the white matter. Electronically Signed   By: Franchot Gallo M.D.   On: 03/10/2019 14:04    US Renal  Result Date: 03/10/2019 CLINICAL DATA:  New onset renal failure. EXAM: RENAL / URINARY TRACT ULTRASOUND COMPLETE COMPARISON:  None. FINDINGS: Right Kidney: Renal measurements: 11.1 x 4.1 x 5.0 cm = volume: 120 mL. Increased parenchymal echogenicity. No mass, stone or hydronephrosis. Left Kidney: Renal measurements: 11.6 x 5.6 x 5.1 cm = volume: 172 mL. Increased parenchymal echogenicity. Medial midpole cyst measuring 1.7 x 1.3 x 1.4 cm. No other masses, no stones and no hydronephrosis. Bladder: Minimally distended.  Otherwise unremarkable. Other: None. IMPRESSION: 1. Bilateral increased renal parenchymal echogenicity consistent with medical renal disease. 2. No hydronephrosis.  No acute finding. 3. 1.7 cm left renal cyst. Electronically Signed   By: Lajean Manes M.D.   On: 03/10/2019 11:38   IR Fluoro Guide CV Line Right  Result Date: 03/12/2019 INDICATION: 59 year old with renal failure and starting hemodialysis. Hemodialysis catheter is needed. EXAM: FLUOROSCOPIC AND ULTRASOUND GUIDED PLACEMENT OF A TUNNELED DIALYSIS CATHETER Physician: Stephan Minister. Anselm Pancoast, MD MEDICATIONS: Ancef 2 g; The antibiotic was administered within an appropriate time interval prior to skin puncture. ANESTHESIA/SEDATION: Versed 1.0 mg IV; Fentanyl 50 mcg IV; Moderate Sedation Time:  20 minutes The patient was continuously monitored during the procedure by the interventional radiology nurse under my direct supervision. FLUOROSCOPY TIME:  Fluoroscopy Time: 48 seconds, 51.7 mGy COMPLICATIONS: None immediate. PROCEDURE: The procedure was explained to the patient. The risks and benefits of the procedure were discussed and the patient's questions were addressed. Informed consent was obtained from the patient. The patient was placed supine on the interventional table. Ultrasound confirmed a patent right internal jugular vein. Ultrasound image was obtained for documentation. The right neck and chest was prepped and draped in a sterile  fashion. The right neck was anesthetized with 1% lidocaine. Maximal barrier sterile technique was utilized including caps, mask, sterile gowns, sterile gloves, sterile  drape, hand hygiene and skin antiseptic. A small incision was made with #11 blade scalpel. A 21 gauge needle directed into the right internal jugular vein vein with ultrasound guidance. A micropuncture dilator set was placed. A 23 cm tip to cuff Palindrome catheter was selected. The skin below the right clavicle was anesthetized and a small incision was made with an #11 blade scalpel. A subcutaneous tunnel was formed to the vein dermatotomy site. The catheter was brought through the tunnel. The vein dermatotomy site was dilated to accommodate a peel-away sheath. The catheter was placed through the peel-away sheath and directed into the central venous structures. The tip of the catheter was placed at the SVC and right atrium junction with fluoroscopy. Fluoroscopic images were obtained for documentation. Both lumens were found to aspirate and flush well. The proper amount of heparin was flushed in both lumens. The vein dermatotomy site was closed using a single layer of absorbable suture and Dermabond. Gel-Foam placed in the subcutaneous tract. The catheter was secured to the skin using Prolene suture. IMPRESSION: Successful placement of a right jugular tunneled dialysis catheter using ultrasound and fluoroscopic guidance. Electronically Signed   By: Markus Daft M.D.   On: 03/12/2019 18:06   IR US Guide Vasc Access Right  Result Date: 03/12/2019 INDICATION: 59 year old with renal failure and starting hemodialysis. Hemodialysis catheter is needed. EXAM: FLUOROSCOPIC AND ULTRASOUND GUIDED PLACEMENT OF A TUNNELED DIALYSIS CATHETER Physician: Stephan Minister. Anselm Pancoast, MD MEDICATIONS: Ancef 2 g; The antibiotic was administered within an appropriate time interval prior to skin puncture. ANESTHESIA/SEDATION: Versed 1.0 mg IV; Fentanyl 50 mcg IV; Moderate Sedation Time:   20 minutes The patient was continuously monitored during the procedure by the interventional radiology nurse under my direct supervision. FLUOROSCOPY TIME:  Fluoroscopy Time: 48 seconds, 95.6 mGy COMPLICATIONS: None immediate. PROCEDURE: The procedure was explained to the patient. The risks and benefits of the procedure were discussed and the patient's questions were addressed. Informed consent was obtained from the patient. The patient was placed supine on the interventional table. Ultrasound confirmed a patent right internal jugular vein. Ultrasound image was obtained for documentation. The right neck and chest was prepped and draped in a sterile fashion. The right neck was anesthetized with 1% lidocaine. Maximal barrier sterile technique was utilized including caps, mask, sterile gowns, sterile gloves, sterile drape, hand hygiene and skin antiseptic. A small incision was made with #11 blade scalpel. A 21 gauge needle directed into the right internal jugular vein vein with ultrasound guidance. A micropuncture dilator set was placed. A 23 cm tip to cuff Palindrome catheter was selected. The skin below the right clavicle was anesthetized and a small incision was made with an #11 blade scalpel. A subcutaneous tunnel was formed to the vein dermatotomy site. The catheter was brought through the tunnel. The vein dermatotomy site was dilated to accommodate a peel-away sheath. The catheter was placed through the peel-away sheath and directed into the central venous structures. The tip of the catheter was placed at the SVC and right atrium junction with fluoroscopy. Fluoroscopic images were obtained for documentation. Both lumens were found to aspirate and flush well. The proper amount of heparin was flushed in both lumens. The vein dermatotomy site was closed using a single layer of absorbable suture and Dermabond. Gel-Foam placed in the subcutaneous tract. The catheter was secured to the skin using Prolene suture.  IMPRESSION: Successful placement of a right jugular tunneled dialysis catheter using ultrasound and fluoroscopic guidance. Electronically Signed   By:  Markus Daft M.D.   On: 03/12/2019 18:06   US BIOPSY (KIDNEY)  Result Date: 03/13/2019 INDICATION: Renal failure of uncertain etiology. Please perform ultrasound-guided random renal biopsy for tissue diagnostic purposes. EXAM: ULTRASOUND GUIDED RENAL BIOPSY COMPARISON:  Renal ultrasound-03/10/2019 MEDICATIONS: None. ANESTHESIA/SEDATION: Fentanyl 50 mcg IV; Versed 1 mg IV Total Moderate Sedation time: 10 minutes; The patient was continuously monitored during the procedure by the interventional radiology nurse under my direct supervision. COMPLICATIONS: None immediate. PROCEDURE: Informed written consent was obtained from the patient after a discussion of the risks, benefits and alternatives to treatment. The patient understands and consents the procedure. A timeout was performed prior to the initiation of the procedure. Ultrasound scanning was performed of the bilateral flanks. The inferior pole of the left kidney was selected for biopsy due to location and sonographic window. The procedure was planned. The operative site was prepped and draped in the usual sterile fashion. The overlying soft tissues were anesthetized with 1% lidocaine with epinephrine. A 17 gauge core needle biopsy device was advanced into the inferior cortex of the left kidney and 3 core biopsies were obtained under direct ultrasound guidance. Images were saved for documentation purposes. The biopsy device was removed and hemostasis was obtained with manual compression. Post procedural scanning was negative for significant post procedural hemorrhage or additional complication. A dressing was placed. The patient tolerated the procedure well without immediate post procedural complication. IMPRESSION: Technically successful ultrasound guided left renal biopsy. Electronically Signed   By: Sandi Mariscal  M.D.   On: 03/13/2019 09:36     Antimicrobials:   Bactrim started March 10   Subjective: No acute events overnight Answering questions appropriately Pleasant and cooperative  Objective: Vitals:   03/22/19 2041 03/23/19 0500 03/23/19 0524 03/23/19 0927  BP: (!) 157/88  (!) 158/92 (!) 151/84  Pulse: 63  (!) 58 (!) 59  Resp: 17  18 18   Temp: 98.6 F (37 C)  97.9 F (36.6 C) 98.2 F (36.8 C)  TempSrc: Oral  Oral Oral  SpO2: 98%  96% 97%  Weight: 74.3 kg 74.3 kg    Height:        Intake/Output Summary (Last 24 hours) at 03/23/2019 1159 Last data filed at 03/23/2019 0925 Gross per 24 hour  Intake 1200 ml  Output 1050 ml  Net 150 ml   Filed Weights   03/22/19 0500 03/22/19 2041 03/23/19 0500  Weight: 78.6 kg 74.3 kg 74.3 kg    Examination:  General exam: Appears calm and comfortable  Respiratory system: Clear to auscultation. Respiratory effort normal. Cardiovascular system: S1 & S2 heard, RRR. No JVD, murmurs, rubs, gallops or clicks. No pedal edema. Gastrointestinal system: Abdomen is nondistended, soft and nontender. No organomegaly or masses felt. Normal bowel sounds heard. Central nervous system: Alert and oriented. No focal neurological deficits. Extremities: Warm well perfused, trace bilateral lower extremity edema Skin: No rashes, lesions or ulcers Psychiatry: Judgement and insight appear normal. Mood & affect appropriate..     Data Reviewed: I have personally reviewed following labs and imaging studies  CBC: Recent Labs  Lab 03/20/19 0411 03/23/19 0223  WBC 12.1* 15.3*  HGB 8.5* 8.4*  HCT 26.4* 25.6*  MCV 90.1 89.5  PLT 180 716   Basic Metabolic Panel: Recent Labs  Lab 03/19/19 0707 03/20/19 0411 03/21/19 0635 03/22/19 0440 03/23/19 0223  NA 144 143 141 140 141  K 4.2 3.9 4.0 4.2 4.5  CL 106 106 106 105 108  CO2 27 24 23  21*  21*  GLUCOSE 100* 82 102* 95 120*  BUN 66* 72* 84* 93* 93*  CREATININE 5.72* 6.37* 7.20* 7.79* 7.63*  CALCIUM  7.7* 7.3* 7.9* 8.0* 7.9*  PHOS 4.5 4.9* 5.1* 5.6* 5.8*   GFR: Estimated Creatinine Clearance: 9.3 mL/min (A) (by C-G formula based on SCr of 7.63 mg/dL (H)). Liver Function Tests: Recent Labs  Lab 03/19/19 0707 03/20/19 0411 03/21/19 0635 03/22/19 0440 03/23/19 0223  ALBUMIN 2.3* 2.3* 2.5* 2.4* 2.4*   No results for input(s): LIPASE, AMYLASE in the last 168 hours. No results for input(s): AMMONIA in the last 168 hours. Coagulation Profile: No results for input(s): INR, PROTIME in the last 168 hours. Cardiac Enzymes: No results for input(s): CKTOTAL, CKMB, CKMBINDEX, TROPONINI in the last 168 hours. BNP (last 3 results) No results for input(s): PROBNP in the last 8760 hours. HbA1C: No results for input(s): HGBA1C in the last 72 hours. CBG: Recent Labs  Lab 03/22/19 1303 03/22/19 1718 03/22/19 2041 03/23/19 0635 03/23/19 1124  GLUCAP 144* 153* 160* 75 103*   Lipid Profile: No results for input(s): CHOL, HDL, LDLCALC, TRIG, CHOLHDL, LDLDIRECT in the last 72 hours. Thyroid Function Tests: No results for input(s): TSH, T4TOTAL, FREET4, T3FREE, THYROIDAB in the last 72 hours. Anemia Panel: No results for input(s): VITAMINB12, FOLATE, FERRITIN, TIBC, IRON, RETICCTPCT in the last 72 hours. Sepsis Labs: No results for input(s): PROCALCITON, LATICACIDVEN in the last 168 hours.  No results found for this or any previous visit (from the past 240 hour(s)).       Radiology Studies: No results found.      Scheduled Meds: . amLODipine  5 mg Oral Daily  . Chlorhexidine Gluconate Cloth  6 each Topical Q0600  . Chlorhexidine Gluconate Cloth  6 each Topical Q0600  . cyclophosphamide  125 mg Oral Daily  . darbepoetin (ARANESP) injection - NON-DIALYSIS  100 mcg Subcutaneous Q Sun-1800  . heparin injection (subcutaneous)  5,000 Units Subcutaneous Q8H  . insulin aspart  0-9 Units Subcutaneous TID WC  . predniSONE  60 mg Oral Q breakfast  . sulfamethoxazole-trimethoprim  1  tablet Oral Daily   Continuous Infusions:   LOS: 12 days    Time spent: Milo, MD [facility]  If 7PM-7AM, please contact night-coverage  03/23/2019, 11:59 AM

## 2019-03-23 NOTE — Plan of Care (Signed)
  Problem: Activity: Goal: Risk for activity intolerance will decrease Outcome: Progressing   

## 2019-03-23 NOTE — Progress Notes (Addendum)
[facility] Progress Note    Assessment/ Plan:   Pt is a 59 y.o. yo male homeless smoker who was admitted on 03/10/2019 with N/V and dizziness- found to have a crt of 10- no old data   Assessment/Plan: 1. Renal-  AKI vs CKD.  Had hematuria- work up showed anti MPO markedly positive.  S/p renal biopsy on 3/12-  Got a preliminary report.  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  Felt there was salvageable kidney tissue to attempt treatment- final report pending as of 03/21/19.  Received bolus steroids for 3 days now on pred 60 daily.  Started oral cytoxan 125 mg daily on 3/10.  Required HD on 3/10 and 3/12.  Nonoliguric.  HD for clearance 3/16.  Hep and HIV negative, getting quantiferon gold--> indeterminate (? suboptimal specimen handling).  PPD placed 3/18, negative 3/20 on my read.  Will also check CT chest w/o contrast--> no evidence of vasculitis, some pleural effusions.  We are starting to look for AKI outpt HD chair- pt not happy about that is finally agreeable- insurance is the big barrier now.  No indication for HD today- seems like he might need some but Cr has plateaued.  Will plan for Monday 3/22, check with MD before bringing.  No AVF yet--> hopeful for recovery. I called [facility] Friday PM, no final bx report yet.  2. HTN/vol- BP up a little- added amlodipine 5 mg daily today  3. Anemia- iron stores OK, on aranesp 100 mcg q Sunday  Subjective:    Cr plateaued today, UOP great today, no complaints.  Has been doing pushups in the room   Objective:   BP (!) 151/84 (BP Location: Right Arm)   Pulse (!) 59   Temp 98.2 F (36.8 C) (Oral)   Resp 18   Ht 5\' 2"  (1.575 m)   Wt 74.3 kg   SpO2 97%   BMI 29.96 kg/m   Intake/Output Summary (Last 24 hours) at 03/23/2019 1227 Last data filed at 03/23/2019 1202 Gross per 24 hour  Intake 1440 ml  Output 1250 ml   Net 190 ml   Weight change: -4.3 kg  Physical Exam: Gen: NAD, sitting in bed CVS: RRR no m/r/g Resp: clear bilaterally.  Abd: soft nontender Ext: no LE edema ACCESS: R IJ TDC  Imaging: No results found.  Labs: BMET Recent Labs  Lab 03/17/19 0410 03/18/19 0413 03/19/19 0707 03/20/19 0411 03/21/19 0635 03/22/19 0440 03/23/19 0223  NA 141 141 144 143 141 140 141  K 3.9 4.3 4.2 3.9 4.0 4.2 4.5  CL 108 107 106 106 106 105 108  CO2 22 22 27 24 23  21* 21*  GLUCOSE 102* 94 100* 82 102* 95 120*  BUN 90* 100* 66* 72* 84* 93* 93*  CREATININE 6.97* 7.18* 5.72* 6.37* 7.20* 7.79* 7.63*  CALCIUM 7.4* 7.7* 7.7* 7.3* 7.9* 8.0* 7.9*  PHOS 6.1* 5.8* 4.5 4.9* 5.1* 5.6* 5.8*   CBC Recent Labs  Lab 03/20/19 0411 03/23/19 0223  WBC 12.1* 15.3*  HGB 8.5* 8.4*  HCT 26.4* 25.6*  MCV 90.1 89.5  PLT 180 224    Medications:    . amLODipine  5 mg Oral Daily  . Chlorhexidine Gluconate Cloth  6 each Topical Q0600  . Chlorhexidine Gluconate Cloth  6 each Topical Q0600  . cyclophosphamide  125 mg Oral Daily  . darbepoetin (ARANESP) injection -  NON-DIALYSIS  100 mcg Subcutaneous Q Sun-1800  . heparin injection (subcutaneous)  5,000 Units Subcutaneous Q8H  . insulin aspart  0-9 Units Subcutaneous TID WC  . predniSONE  60 mg Oral Q breakfast  . sulfamethoxazole-trimethoprim  1 tablet Oral Daily      Madelon Lips, MD 03/23/2019, 12:27 PM

## 2019-03-23 NOTE — Plan of Care (Signed)
  Problem: Clinical Measurements: Goal: Diagnostic test results will improve Outcome: Progressing   

## 2019-03-24 LAB — RENAL FUNCTION PANEL
Albumin: 2.5 g/dL — ABNORMAL LOW (ref 3.5–5.0)
Anion gap: 13 (ref 5–15)
BUN: 101 mg/dL — ABNORMAL HIGH (ref 6–20)
CO2: 21 mmol/L — ABNORMAL LOW (ref 22–32)
Calcium: 8.2 mg/dL — ABNORMAL LOW (ref 8.9–10.3)
Chloride: 108 mmol/L (ref 98–111)
Creatinine, Ser: 7.54 mg/dL — ABNORMAL HIGH (ref 0.61–1.24)
GFR calc Af Amer: 8 mL/min — ABNORMAL LOW (ref >60)
GFR calc non Af Amer: 7 mL/min — ABNORMAL LOW (ref >60)
Glucose, Bld: 102 mg/dL — ABNORMAL HIGH (ref 70–99)
Phosphorus: 6.3 mg/dL — ABNORMAL HIGH (ref 2.5–4.6)
Potassium: 4.8 mmol/L (ref 3.5–5.1)
Sodium: 142 mmol/L (ref 135–145)

## 2019-03-24 LAB — CBC
HCT: 27.4 % — ABNORMAL LOW (ref 39.0–52.0)
Hemoglobin: 9 g/dL — ABNORMAL LOW (ref 13.0–17.0)
MCH: 30 pg (ref 26.0–34.0)
MCHC: 32.8 g/dL (ref 30.0–36.0)
MCV: 91.3 fL (ref 80.0–100.0)
Platelets: 178 10*3/uL (ref 150–400)
RBC: 3 MIL/uL — ABNORMAL LOW (ref 4.22–5.81)
RDW: 14.5 % (ref 11.5–15.5)
WBC: 15 10*3/uL — ABNORMAL HIGH (ref 4.0–10.5)
nRBC: 0.1 % (ref 0.0–0.2)

## 2019-03-24 LAB — GLUCOSE, CAPILLARY
Glucose-Capillary: 79 mg/dL (ref 70–99)
Glucose-Capillary: 81 mg/dL (ref 70–99)
Glucose-Capillary: 84 mg/dL (ref 70–99)
Glucose-Capillary: 127 mg/dL — ABNORMAL HIGH (ref 70–99)

## 2019-03-24 MED ORDER — ACETAMINOPHEN 325 MG PO TABS
ORAL_TABLET | ORAL | Status: AC
  Filled 2019-03-24: qty 2

## 2019-03-24 MED ORDER — HEPARIN SODIUM (PORCINE) 1000 UNIT/ML IJ SOLN
INTRAMUSCULAR | Status: AC
  Administered 2019-03-24: 4000 [IU]
  Filled 2019-03-24: qty 4

## 2019-03-24 NOTE — Progress Notes (Signed)
[facility] Progress Note    Assessment/ Plan:   Pt is a 59 y.o. yo male homeless smoker who was admitted on 03/10/2019 with N/V and dizziness- found to have a crt of 10- no old data   Assessment/Plan: 1. Renal-  AKI vs CKD.  Had hematuria- work up showed anti MPO markedly positive.  S/p renal biopsy on 3/12-  Got a preliminary report.  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  Felt there was salvageable kidney tissue to attempt treatment- final report pending as of 03/21/19.  Received bolus steroids for 3 days now on pred 60 daily.  Started oral cytoxan 125 mg daily on 3/10.  Required HD on 3/10 and 3/12.  Nonoliguric.  HD for clearance 3/16.  Hep and HIV negative, getting quantiferon gold--> indeterminate (? suboptimal specimen handling).  PPD placed 3/18, negative 3/20 on Dr. Bishop Dublin read.  Also checked CT chest w/o contrast--> no evidence of vasculitis, some pleural effusions.  We are starting to look for AKI outpt HD chair- pt not happy about that is finally agreeable- insurance is the big barrier now.  No indication for HD today- seems like he might need some but Cr has plateaued.  Will plan for Monday 3/22, check with MD before bringing.  No AVF yet--> hopeful for recovery. Dr. Hollie Salk called [facility] Friday PM, no final bx report yet.  Should result this week.  2. HTN/vol- BP up a little- added amlodipine 5 mg daily 3/22  3. Anemia- iron stores OK, on aranesp 100 mcg q Sunday  Subjective:    Cr plateaued today, UOP 1L yesterday, no complaints except continued need for hospitalization.  Has been doing pushups in the room   Objective:   BP (!) 165/89 (BP Location: Right Arm)   Pulse (!) 57   Temp 98.3 F (36.8 C) (Oral)   Resp 17   Ht 5\' 2"  (1.575 m)   Wt 74.3 kg   SpO2 96%   BMI 29.96 kg/m   Intake/Output Summary (Last 24 hours) at 03/24/2019 0807 Last  data filed at 03/24/2019 0630 Gross per 24 hour  Intake 960 ml  Output 1026 ml  Net -66 ml   Weight change:   Physical Exam: Gen: NAD, sitting in bed CVS: RRR no m/r/g Resp: clear bilaterally.  Abd: soft nontender Ext: no LE edema ACCESS: R IJ TDC  Imaging: No results found.  Labs: BMET Recent Labs  Lab 03/18/19 0413 03/19/19 0707 03/20/19 0411 03/21/19 0635 03/22/19 0440 03/23/19 0223 03/24/19 0242  NA 141 144 143 141 140 141 142  K 4.3 4.2 3.9 4.0 4.2 4.5 4.8  CL 107 106 106 106 105 108 108  CO2 22 27 24 23  21* 21* 21*  GLUCOSE 94 100* 82 102* 95 120* 102*  BUN 100* 66* 72* 84* 93* 93* 101*  CREATININE 7.18* 5.72* 6.37* 7.20* 7.79* 7.63* 7.54*  CALCIUM 7.7* 7.7* 7.3* 7.9* 8.0* 7.9* 8.2*  PHOS 5.8* 4.5 4.9* 5.1* 5.6* 5.8* 6.3*   CBC Recent Labs  Lab 03/20/19 0411 03/23/19 0223  WBC 12.1* 15.3*  HGB 8.5* 8.4*  HCT 26.4* 25.6*  MCV 90.1 89.5  PLT 180 224    Medications:    . amLODipine  5 mg Oral Daily  . Chlorhexidine Gluconate Cloth  6 each Topical Q0600  . Chlorhexidine Gluconate Cloth  6 each Topical Q0600  . cyclophosphamide  125 mg Oral Daily  . darbepoetin (ARANESP) injection - NON-DIALYSIS  100 mcg Subcutaneous Q Sun-1800  . heparin injection (subcutaneous)  5,000 Units Subcutaneous Q8H  . insulin aspart  0-9 Units Subcutaneous TID WC  . predniSONE  60 mg Oral Q breakfast  . sulfamethoxazole-trimethoprim  1 tablet Oral Daily     Jannifer Hick MD [facility] Pager [phone]

## 2019-03-24 NOTE — Progress Notes (Addendum)
PROGRESS NOTE    Gabriel Harris  KKX:[id number] DOB: 1960/10/06 DOA: 03/10/2019 PCP: Patient, No Pcp Per   Brief Narrative: Patient is a 59 year old male with history of tobacco abuse who presented to the emergency department with 2 weeks history of worsening dizziness, nausea, tinnitus, diplopia, diarrhea, vomiting.  On presentation, MRI was negative for any acute intracranial findings but he was found to have severe AKI with creatinine greater than 10.  Started on IV fluids, nephrology consulted.  Started on hemodialysis.  He had renal biopsy on 3/11, report is pending.  Anticipating spontaneous renal recovery.  Assessment & Plan:   Principal Problem:   AKI (acute kidney injury) ([place]) Active Problems:   Hyperkalemia   Normocytic anemia   Hypoalbuminemia   Sinus bradycardia   Tobacco use   Vertigo   Diplopia   Severe AKI  versus progressive CKD: Had hematuria.  Work-up showed anti-MPO positivity.  Status post renal biopsy in 3/12.  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  Received bolus steroids for 3 days now on prednisone 60 mg daily, Cytoxan 125 mg daily. Started on bactrim.He is  Nonoliguric.  Hepatitis/HIV negative.  PPD negative.  Needs outpatient hemodialysis, insurance is a barrier.  No plan for AV fistula yet, hopeful for recovery.  Hyperkalemia: Resolved with Lokelma in dialysis  Hypertension: Currently blood pressure fairly stable.  Continue to monitor .  Currently on amlodipine  Vertigo/diplopia: Overall improved.  MRI did not show any acute findings.  Chronic normocytic anemia: Stable.  Associated with CKD.  Continue ESA.  Currently CBC stable  Leukocytosis: Suspected to be reactive, steroids.  No fever.  Continue to monitor.  No indication for antibiotic therapy.  Sinus bradycardia: Stable  Tobacco use: Cessation advised.  He says he smokes 2 to 3 packs of cigarettes a day.  Low albumin : Likely in the setting of nephrotic syndrome/CKD.         DVT prophylaxis: SCD,  compression stockings Code Status: Full Family Communication: None present at the bedside Disposition Plan: Patient is from home.  Pending renal biopsy/clearance from nephrology for discharge.  Anticipate discharge plan to homeless filter after nephrology clearance, might need outpatient dialysis center.   Consultants: Nephrology  Procedures: Dialysis catheter placement  Antimicrobials:  Anti-infectives (From admission, onward)   Start     Dose/Rate Route Frequency Ordered Stop   03/12/19 1536  ceFAZolin (ANCEF) 2-4 GM/100ML-% IVPB    Note to Pharmacy: Rodney Booze   : cabinet override      03/12/19 1536 03/12/19 1606   03/12/19 1300  sulfamethoxazole-trimethoprim (BACTRIM) 400-80 MG per tablet 1 tablet     1 tablet Oral Daily 03/12/19 1259     03/12/19 0800  ceFAZolin (ANCEF) IVPB 2g/100 mL premix     2 g 200 mL/hr over 30 Minutes Intravenous  Once 03/11/19 1615 03/12/19 1610      Subjective: Patient seen and examined at the bedside this afternoon.  Hemodynamically stable.  Comfortable.  Denies any complaints.  Very eager to leave the hospital.  Little frustrated to wait for so many days for the kidney biopsy report.  Objective: Vitals:   03/23/19 1631 03/23/19 2100 03/24/19 0504 03/24/19 1000  BP: (!) 169/92 139/89 (!) 165/89 (!) 148/84  Pulse: 60 62 (!) 57 61  Resp: 18 17 17 18   Temp: 97.9 F (36.6 C) 98.6 F (37 C) 98.3 F (36.8 C)   TempSrc: Oral Oral Oral   SpO2: 98% 97% 96% 97%  Weight:      Height:  Intake/Output Summary (Last 24 hours) at 03/24/2019 1259 Last data filed at 03/24/2019 1030 Gross per 24 hour  Intake 780 ml  Output 976 ml  Net -196 ml   Filed Weights   03/22/19 0500 03/22/19 2041 03/23/19 0500  Weight: 78.6 kg 74.3 kg 74.3 kg    Examination:  General exam: Appears calm and comfortable ,Not in distress,average built HEENT:PERRL,Oral mucosa moist, Ear/Nose normal on gross exam Respiratory system: Bilateral equal air entry, normal  vesicular breath sounds, no wheezes or crackles  Cardiovascular system: S1 & S2 heard, RRR. No JVD, murmurs, rubs, gallops or clicks. No pedal edema.  Temporary dialysis catheter on right chest. Gastrointestinal system: Abdomen is nondistended, soft and nontender. No organomegaly or masses felt. Normal bowel sounds heard. Central nervous system: Alert and oriented. No focal neurological deficits. Extremities: No edema, no clubbing ,no cyanosis Skin: No rashes, lesions or ulcers,no icterus ,no pallor   Data Reviewed: I have personally reviewed following labs and imaging studies  CBC: Recent Labs  Lab 03/20/19 0411 03/23/19 0223  WBC 12.1* 15.3*  HGB 8.5* 8.4*  HCT 26.4* 25.6*  MCV 90.1 89.5  PLT 180 992   Basic Metabolic Panel: Recent Labs  Lab 03/20/19 0411 03/21/19 0635 03/22/19 0440 03/23/19 0223 03/24/19 0242  NA 143 141 140 141 142  K 3.9 4.0 4.2 4.5 4.8  CL 106 106 105 108 108  CO2 24 23 21* 21* 21*  GLUCOSE 82 102* 95 120* 102*  BUN 72* 84* 93* 93* 101*  CREATININE 6.37* 7.20* 7.79* 7.63* 7.54*  CALCIUM 7.3* 7.9* 8.0* 7.9* 8.2*  PHOS 4.9* 5.1* 5.6* 5.8* 6.3*   GFR: Estimated Creatinine Clearance: 9.4 mL/min (A) (by C-G formula based on SCr of 7.54 mg/dL (H)). Liver Function Tests: Recent Labs  Lab 03/20/19 0411 03/21/19 4268 03/22/19 0440 03/23/19 0223 03/24/19 0242  ALBUMIN 2.3* 2.5* 2.4* 2.4* 2.5*   No results for input(s): LIPASE, AMYLASE in the last 168 hours. No results for input(s): AMMONIA in the last 168 hours. Coagulation Profile: No results for input(s): INR, PROTIME in the last 168 hours. Cardiac Enzymes: No results for input(s): CKTOTAL, CKMB, CKMBINDEX, TROPONINI in the last 168 hours. BNP (last 3 results) No results for input(s): PROBNP in the last 8760 hours. HbA1C: No results for input(s): HGBA1C in the last 72 hours. CBG: Recent Labs  Lab 03/23/19 1124 03/23/19 1629 03/23/19 2102 03/24/19 0640 03/24/19 1115  GLUCAP 103* 125*  128* 79 81   Lipid Profile: No results for input(s): CHOL, HDL, LDLCALC, TRIG, CHOLHDL, LDLDIRECT in the last 72 hours. Thyroid Function Tests: No results for input(s): TSH, T4TOTAL, FREET4, T3FREE, THYROIDAB in the last 72 hours. Anemia Panel: No results for input(s): VITAMINB12, FOLATE, FERRITIN, TIBC, IRON, RETICCTPCT in the last 72 hours. Sepsis Labs: No results for input(s): PROCALCITON, LATICACIDVEN in the last 168 hours.  No results found for this or any previous visit (from the past 240 hour(s)).       Radiology Studies: No results found.      Scheduled Meds: . amLODipine  5 mg Oral Daily  . Chlorhexidine Gluconate Cloth  6 each Topical Q0600  . Chlorhexidine Gluconate Cloth  6 each Topical Q0600  . cyclophosphamide  125 mg Oral Daily  . darbepoetin (ARANESP) injection - NON-DIALYSIS  100 mcg Subcutaneous Q Sun-1800  . heparin injection (subcutaneous)  5,000 Units Subcutaneous Q8H  . insulin aspart  0-9 Units Subcutaneous TID WC  . predniSONE  60 mg Oral Q breakfast  .  sulfamethoxazole-trimethoprim  1 tablet Oral Daily   Continuous Infusions:   LOS: 13 days    Time spent: 25 mins.More than 50% of that time was spent in counseling and/or coordination of care.      Shelly Coss, MD [facility] P3/22/2021, 12:59 PM

## 2019-03-25 LAB — CBC WITH DIFFERENTIAL/PLATELET
Abs Immature Granulocytes: 0.15 10*3/uL — ABNORMAL HIGH (ref 0.00–0.07)
Basophils Absolute: 0 10*3/uL (ref 0.0–0.1)
Basophils Relative: 0 %
Eosinophils Absolute: 0 10*3/uL (ref 0.0–0.5)
Eosinophils Relative: 0 %
HCT: 28.2 % — ABNORMAL LOW (ref 39.0–52.0)
Hemoglobin: 9.2 g/dL — ABNORMAL LOW (ref 13.0–17.0)
Immature Granulocytes: 1 %
Lymphocytes Relative: 2 %
Lymphs Abs: 0.3 10*3/uL — ABNORMAL LOW (ref 0.7–4.0)
MCH: 29.9 pg (ref 26.0–34.0)
MCHC: 32.6 g/dL (ref 30.0–36.0)
MCV: 91.6 fL (ref 80.0–100.0)
Monocytes Absolute: 0.5 10*3/uL (ref 0.1–1.0)
Monocytes Relative: 4 %
Neutro Abs: 12.9 10*3/uL — ABNORMAL HIGH (ref 1.7–7.7)
Neutrophils Relative %: 93 %
Platelets: 183 10*3/uL (ref 150–400)
RBC: 3.08 MIL/uL — ABNORMAL LOW (ref 4.22–5.81)
RDW: 14.6 % (ref 11.5–15.5)
WBC: 13.9 10*3/uL — ABNORMAL HIGH (ref 4.0–10.5)
nRBC: 0.2 % (ref 0.0–0.2)

## 2019-03-25 LAB — GLUCOSE, CAPILLARY
Glucose-Capillary: 155 mg/dL — ABNORMAL HIGH (ref 70–99)
Glucose-Capillary: 107 mg/dL — ABNORMAL HIGH (ref 70–99)
Glucose-Capillary: 160 mg/dL — ABNORMAL HIGH (ref 70–99)
Glucose-Capillary: 117 mg/dL — ABNORMAL HIGH (ref 70–99)

## 2019-03-25 NOTE — Progress Notes (Signed)
PROGRESS NOTE    Gabriel Harris  QMV:[id number] DOB: [birth year] DOA: 03/10/2019 PCP: Patient, No Pcp Per   Brief Narrative: Patient is a 59 year old male with history of tobacco abuse who presented to the emergency department with 2 weeks history of worsening dizziness, nausea, tinnitus, diplopia, diarrhea, vomiting.  On presentation, MRI was negative for any acute intracranial findings but he was found to have severe AKI with creatinine greater than 10.  Started on IV fluids, nephrology consulted.  Started on hemodialysis.  He had renal biopsy on 3/11, report is pending.  Anticipating spontaneous renal recovery.  Assessment & Plan:   Principal Problem:   AKI (acute kidney injury) ([place]) Active Problems:   Hyperkalemia   Normocytic anemia   Hypoalbuminemia   Sinus bradycardia   Tobacco use   Vertigo   Diplopia   Severe AKI  versus progressive CKD: Had hematuria.  Work-up showed anti-MPO positivity.  Status post renal biopsy in 3/12.  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  Received bolus steroids for 3 days now on prednisone 60 mg daily, Cytoxan 125 mg daily. Started on bactrim.He is  Nonoliguric.  Hepatitis/HIV negative.  PPD negative.  Needs outpatient hemodialysis, insurance is a barrier.  No plan for AV fistula yet, hopeful for recovery.  Hyperkalemia: Resolved with Lokelma in dialysis  Hypertension: Currently blood pressure fairly stable.  Continue to monitor .  Currently on amlodipine  Vertigo/diplopia: Overall improved.  MRI did not show any acute findings.  Chronic normocytic anemia: Stable.  Associated with CKD.  Continue ESA.  Currently CBC stable  Leukocytosis: Suspected to be reactive,associated with steroids.  No fever.  Continue to monitor.  No indication for antibiotic therapy.  Sinus bradycardia: Stable  Tobacco use: Cessation advised.  He says he smokes 2 to 3 packs of cigarettes a day.  Low albumin : Likely in the setting of nephrotic syndrome/CKD.         DVT  prophylaxis: SCD, compression stockings Code Status: Full Family Communication: None present at the bedside.  Plan discussed with the patient Disposition Plan: Patient is from home.  Pending renal biopsy/clearance from nephrology for discharge.  Anticipate discharge plan to homeless shelter after nephrology clearance, might need outpatient dialysis center.   Consultants: Nephrology  Procedures: Dialysis catheter placement  Antimicrobials:  Anti-infectives (From admission, onward)   Start     Dose/Rate Route Frequency Ordered Stop   03/12/19 1536  ceFAZolin (ANCEF) 2-4 GM/100ML-% IVPB    Note to Pharmacy: Rodney Booze   : cabinet override      03/12/19 1536 03/12/19 1606   03/12/19 1300  sulfamethoxazole-trimethoprim (BACTRIM) 400-80 MG per tablet 1 tablet     1 tablet Oral Daily 03/12/19 1259     03/12/19 0800  ceFAZolin (ANCEF) IVPB 2g/100 mL premix     2 g 200 mL/hr over 30 Minutes Intravenous  Once 03/11/19 1615 03/12/19 1610      Subjective: Patient seen and examined at the bedside this morning.  Hemodynamically stable.  Very frustrated and angry today because  we do not have the renal biopsy report yet.  He said he might leave [place].  Objective: Vitals:   03/24/19 1730 03/24/19 1752 03/24/19 2104 03/25/19 0502  BP: (!) 151/98 (!) 142/81 (!) 150/86 (!) 155/93  Pulse:  69 70 76  Resp:  17 16 16   Temp:  98.2 F (36.8 C) 98.6 F (37 C) 98.4 F (36.9 C)  TempSrc:  Oral Oral Oral  SpO2:  96% 95% 96%  Weight:  77.4 kg 73.4 kg   Height:        Intake/Output Summary (Last 24 hours) at 03/25/2019 0838 Last data filed at 03/25/2019 0550 Gross per 24 hour  Intake 660 ml  Output 1875 ml  Net -1215 ml   Filed Weights   03/24/19 1340 03/24/19 1752 03/24/19 2104  Weight: 77.5 kg 77.4 kg 73.4 kg    Examination:  General exam: Comfortable,sleeping Respiratory system: Bilateral equal air entry, normal vesicular breath sounds, no wheezes or crackles   Cardiovascular system: S1 & S2 heard, RRR. No JVD, murmurs, rubs, gallops or clicks.  Temporary dialysis catheter on the right chest Gastrointestinal system: Abdomen is nondistended, soft and nontender. No organomegaly or masses felt. Normal bowel sounds heard. Central nervous system: Alert and oriented. No focal neurological deficits. Extremities: No edema, no clubbing ,no cyanosis Skin: No rashes, lesions or ulcers,no icterus ,no pallor     Data Reviewed: I have personally reviewed following labs and imaging studies  CBC: Recent Labs  Lab 03/20/19 0411 03/23/19 0223 03/24/19 1822  WBC 12.1* 15.3* 15.0*  HGB 8.5* 8.4* 9.0*  HCT 26.4* 25.6* 27.4*  MCV 90.1 89.5 91.3  PLT 180 224 093   Basic Metabolic Panel: Recent Labs  Lab 03/20/19 0411 03/21/19 0635 03/22/19 0440 03/23/19 0223 03/24/19 0242  NA 143 141 140 141 142  K 3.9 4.0 4.2 4.5 4.8  CL 106 106 105 108 108  CO2 24 23 21* 21* 21*  GLUCOSE 82 102* 95 120* 102*  BUN 72* 84* 93* 93* 101*  CREATININE 6.37* 7.20* 7.79* 7.63* 7.54*  CALCIUM 7.3* 7.9* 8.0* 7.9* 8.2*  PHOS 4.9* 5.1* 5.6* 5.8* 6.3*   GFR: Estimated Creatinine Clearance: 9.4 mL/min (A) (by C-G formula based on SCr of 7.54 mg/dL (H)). Liver Function Tests: Recent Labs  Lab 03/20/19 0411 03/21/19 8182 03/22/19 0440 03/23/19 0223 03/24/19 0242  ALBUMIN 2.3* 2.5* 2.4* 2.4* 2.5*   No results for input(s): LIPASE, AMYLASE in the last 168 hours. No results for input(s): AMMONIA in the last 168 hours. Coagulation Profile: No results for input(s): INR, PROTIME in the last 168 hours. Cardiac Enzymes: No results for input(s): CKTOTAL, CKMB, CKMBINDEX, TROPONINI in the last 168 hours. BNP (last 3 results) No results for input(s): PROBNP in the last 8760 hours. HbA1C: No results for input(s): HGBA1C in the last 72 hours. CBG: Recent Labs  Lab 03/24/19 0640 03/24/19 1115 03/24/19 1818 03/24/19 2104 03/25/19 0638  GLUCAP 79 81 84 127* 155*    Lipid Profile: No results for input(s): CHOL, HDL, LDLCALC, TRIG, CHOLHDL, LDLDIRECT in the last 72 hours. Thyroid Function Tests: No results for input(s): TSH, T4TOTAL, FREET4, T3FREE, THYROIDAB in the last 72 hours. Anemia Panel: No results for input(s): VITAMINB12, FOLATE, FERRITIN, TIBC, IRON, RETICCTPCT in the last 72 hours. Sepsis Labs: No results for input(s): PROCALCITON, LATICACIDVEN in the last 168 hours.  No results found for this or any previous visit (from the past 240 hour(s)).       Radiology Studies: No results found.      Scheduled Meds: . amLODipine  5 mg Oral Daily  . Chlorhexidine Gluconate Cloth  6 each Topical Q0600  . Chlorhexidine Gluconate Cloth  6 each Topical Q0600  . cyclophosphamide  125 mg Oral Daily  . darbepoetin (ARANESP) injection - NON-DIALYSIS  100 mcg Subcutaneous Q Sun-1800  . heparin injection (subcutaneous)  5,000 Units Subcutaneous Q8H  . insulin aspart  0-9 Units Subcutaneous TID WC  . predniSONE  60 mg Oral Q breakfast  . sulfamethoxazole-trimethoprim  1 tablet Oral Daily   Continuous Infusions:   LOS: 14 days    Time spent: 25 mins.More than 50% of that time was spent in counseling and/or coordination of care.      Shelly Coss, MD [facility] P3/23/2021, 8:38 AM

## 2019-03-25 NOTE — Progress Notes (Signed)
[facility] Progress Note    Assessment/ Plan:   Pt is a 59 y.o. yo male homeless smoker who was admitted on 03/10/2019 with N/V and dizziness- found to have a crt of 10- no old data   Assessment/Plan: 1. Renal-  AKI vs CKD.  Had hematuria- work up showed anti MPO markedly positive.  S/p renal biopsy on 3/12-  Got a preliminary report.  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  Felt there was salvageable kidney tissue to attempt treatment- final report pending as of 03/21/19.  Received bolus steroids for 3 days now on pred 60 daily.  Started oral cytoxan 125 mg daily on 3/10.  Required HD on 3/10 and 3/12.  Nonoliguric.  HD for clearance 3/16.  Hep and HIV negative, getting quantiferon gold--> indeterminate (? suboptimal specimen handling).  PPD placed 3/18, negative 3/20 on Dr. Bishop Dublin read.  Also checked CT chest w/o contrast--> no evidence of vasculitis, some pleural effusions.  We are starting to look for AKI outpt HD chair- pt not happy about that is finally agreeable- insurance is the big barrier now.  No indication for HD today- seems like he might need some but Cr has plateaued.  Will plan for Monday 3/22, check with MD before bringing.  No AVF yet--> hopeful for recovery. Dr. Hollie Salk called [facility] Friday PM, no final bx report yet.  Should result this week --> will call this afternoon.  2. HTN/vol- BP up a little- added amlodipine 5 mg daily 3/22  3. Anemia- iron stores OK, on aranesp 100 mcg q Sunday  Subjective:    UOP 1.9L yesterday, no complaints except continued need for hospitalization.  Has been doing pushups in the room   Objective:   BP (!) 155/93 (BP Location: Left Arm)   Pulse 76   Temp 98.4 F (36.9 C) (Oral)   Resp 16   Ht 5\' 2"  (1.575 m)   Wt 73.4 kg   SpO2 96%   BMI 29.59 kg/m   Intake/Output Summary (Last 24 hours) at 03/25/2019  1207 Last data filed at 03/25/2019 0800 Gross per 24 hour  Intake 480 ml  Output 1175 ml  Net -695 ml   Weight change:   Physical Exam: Gen: NAD, sitting in bed CVS: RRR no m/r/g Resp: clear bilaterally.  Abd: soft nontender Ext: no LE edema ACCESS: R IJ TDC  Imaging: No results found.  Labs: BMET Recent Labs  Lab 03/19/19 0707 03/20/19 0411 03/21/19 0635 03/22/19 0440 03/23/19 0223 03/24/19 0242  NA 144 143 141 140 141 142  K 4.2 3.9 4.0 4.2 4.5 4.8  CL 106 106 106 105 108 108  CO2 27 24 23  21* 21* 21*  GLUCOSE 100* 82 102* 95 120* 102*  BUN 66* 72* 84* 93* 93* 101*  CREATININE 5.72* 6.37* 7.20* 7.79* 7.63* 7.54*  CALCIUM 7.7* 7.3* 7.9* 8.0* 7.9* 8.2*  PHOS 4.5 4.9* 5.1* 5.6* 5.8* 6.3*   CBC Recent Labs  Lab 03/20/19 0411 03/23/19 0223 03/24/19 1822 03/25/19 0744  WBC 12.1* 15.3* 15.0* 13.9*  NEUTROABS  --   --   --  12.9*  HGB 8.5* 8.4* 9.0* 9.2*  HCT 26.4* 25.6* 27.4* 28.2*  MCV 90.1 89.5 91.3 91.6  PLT 180 224 178 183    Medications:    . amLODipine  5 mg Oral Daily  . Chlorhexidine Gluconate Cloth  6 each  Topical Q0600  . Chlorhexidine Gluconate Cloth  6 each Topical Q0600  . cyclophosphamide  125 mg Oral Daily  . darbepoetin (ARANESP) injection - NON-DIALYSIS  100 mcg Subcutaneous Q Sun-1800  . heparin injection (subcutaneous)  5,000 Units Subcutaneous Q8H  . insulin aspart  0-9 Units Subcutaneous TID WC  . predniSONE  60 mg Oral Q breakfast  . sulfamethoxazole-trimethoprim  1 tablet Oral Daily     Jannifer Hick MD [facility] Pager [phone]

## 2019-03-26 LAB — RENAL FUNCTION PANEL
Albumin: 2.7 g/dL — ABNORMAL LOW (ref 3.5–5.0)
Anion gap: 13 (ref 5–15)
BUN: 76 mg/dL — ABNORMAL HIGH (ref 6–20)
CO2: 22 mmol/L (ref 22–32)
Calcium: 8.3 mg/dL — ABNORMAL LOW (ref 8.9–10.3)
Chloride: 106 mmol/L (ref 98–111)
Creatinine, Ser: 6.24 mg/dL — ABNORMAL HIGH (ref 0.61–1.24)
GFR calc Af Amer: 10 mL/min — ABNORMAL LOW (ref >60)
GFR calc non Af Amer: 9 mL/min — ABNORMAL LOW (ref >60)
Glucose, Bld: 108 mg/dL — ABNORMAL HIGH (ref 70–99)
Phosphorus: 5.6 mg/dL — ABNORMAL HIGH (ref 2.5–4.6)
Potassium: 4.1 mmol/L (ref 3.5–5.1)
Sodium: 141 mmol/L (ref 135–145)

## 2019-03-26 LAB — GLUCOSE, CAPILLARY
Glucose-Capillary: 104 mg/dL — ABNORMAL HIGH (ref 70–99)
Glucose-Capillary: 99 mg/dL (ref 70–99)
Glucose-Capillary: 142 mg/dL — ABNORMAL HIGH (ref 70–99)
Glucose-Capillary: 203 mg/dL — ABNORMAL HIGH (ref 70–99)

## 2019-03-26 MED ORDER — AMLODIPINE BESYLATE 10 MG PO TABS
10.0000 mg | ORAL_TABLET | Freq: Every day | ORAL | Status: DC
  Administered 2019-03-27 – 2019-04-04 (×6): 10 mg via ORAL
  Filled 2019-03-26 (×9): qty 1

## 2019-03-26 NOTE — Progress Notes (Signed)
Patient called out for a Coke.  Explained to patient that Coke is not good for kidneys and asked if he wanted Sprite or Ginger Ale.  Patient became irrate and told me to forget it.

## 2019-03-26 NOTE — Progress Notes (Signed)
[facility] Progress Note    Assessment/ Plan:   Pt is a 59 y.o. yo male homeless smoker who was admitted on 03/10/2019 with N/V and dizziness- found to have a crt of 10- no old data   Assessment/Plan: 1. Renal-  AKI vs CKD.  Had hematuria- work up showed anti MPO markedly positive.  S/p renal biopsy on 3/12-  Got a preliminary report.  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  Felt there was salvageable kidney tissue to attempt treatment- final report reviewed and does look like not a lot of scarring and reasonable to expect some recovery.  Received bolus steroids for 3 days now on pred 60 daily.  Started oral cytoxan 125 mg daily on 3/10.  Required HD on 3/10 and 3/12.  Nonoliguric.  HD for clearance 3/16.  Hep and HIV negative, getting quantiferon gold--> indeterminate (? suboptimal specimen handling).  PPD placed 3/18, negative 3/20 on Dr. Bishop [place] read.  Also checked CT chest w/o contrast--> no evidence of vasculitis, some pleural effusions.  We are starting to look for AKI outpt HD chair- pt not happy about that is finally agreeable- insurance is the big barrier now.  Last HD 3/22, plan next tomorrow but 2nd shift so we can review labs/UOP and cancel if recovery noted.    2. HTN/vol- BP up a little- added amlodipine 5 mg daily 3/22  3. Anemia- iron stores OK, on aranesp 100 mcg q Sunday  dispo - main issue here; asked SW to look into possibility of funding for meds outpt.  Without meds he will surely remain dialysis dependent. D/w him today.    Subjective:    UOP 0.4L yesterday, no complaints except continued need for hospitalization.     Objective:   BP (!) 176/88   Pulse 63   Temp 98.3 F (36.8 C) (Oral)   Resp 20   Ht 5\' 2"  (1.575 m)   Wt 73.4 kg   SpO2 94%   BMI 29.59 kg/m   Intake/Output Summary (Last 24 hours) at 03/26/2019 1221 Last data filed at  03/26/2019 0911 Gross per 24 hour  Intake 540 ml  Output 200 ml  Net 340 ml   Weight change:   Physical Exam: Gen: NAD, sitting in bed CVS: RRR no m/r/g Resp: clear bilaterally.  Abd: soft nontender Ext: no LE edema ACCESS: R IJ TDC  Imaging: No results found.  Labs: BMET Recent Labs  Lab 03/20/19 0411 03/21/19 2952 03/22/19 0440 03/23/19 0223 03/24/19 0242 03/26/19 0849  NA 143 141 140 141 142 141  K 3.9 4.0 4.2 4.5 4.8 4.1  CL 106 106 105 108 108 106  CO2 24 23 21* 21* 21* 22  GLUCOSE 82 102* 95 120* 102* 108*  BUN 72* 84* 93* 93* 101* 76*  CREATININE 6.37* 7.20* 7.79* 7.63* 7.54* 6.24*  CALCIUM 7.3* 7.9* 8.0* 7.9* 8.2* 8.3*  PHOS 4.9* 5.1* 5.6* 5.8* 6.3* 5.6*   CBC Recent Labs  Lab 03/20/19 0411 03/23/19 0223 03/24/19 1822 03/25/19 0744  WBC 12.1* 15.3* 15.0* 13.9*  NEUTROABS  --   --   --  12.9*  HGB 8.5* 8.4* 9.0* 9.2*  HCT 26.4* 25.6* 27.4* 28.2*  MCV 90.1 89.5 91.3 91.6  PLT 180 224 178 183    Medications:    . [START ON 03/27/2019] amLODipine  10 mg Oral Daily  . Chlorhexidine Gluconate Cloth  6 each Topical Q0600  . Chlorhexidine Gluconate Cloth  6 each Topical Q0600  . cyclophosphamide  125 mg Oral Daily  . darbepoetin (ARANESP) injection - NON-DIALYSIS  100 mcg Subcutaneous Q Sun-1800  . heparin injection (subcutaneous)  5,000 Units Subcutaneous Q8H  . insulin aspart  0-9 Units Subcutaneous TID WC  . predniSONE  60 mg Oral Q breakfast  . sulfamethoxazole-trimethoprim  1 tablet Oral Daily     Jannifer Hick MD [facility] Pager [phone]

## 2019-03-26 NOTE — Progress Notes (Signed)
PROGRESS NOTE    Gabriel Harris  YSA:[id number] DOB: 04/24/1960 DOA: 03/10/2019 PCP: Patient, No Pcp Per   Brief Narrative: Patient is a 59 year old male with history of tobacco abuse who presented to the emergency department with 2 weeks history of worsening dizziness, nausea, tinnitus, diplopia, diarrhea, vomiting.  On presentation, MRI was negative for any acute intracranial findings but he was found to have severe AKI with creatinine greater than 10.  Started on IV fluids, nephrology consulted.  Started on hemodialysis.  He had renal biopsy on 3/11, report is pending.  Anticipating spontaneous renal recovery.  Assessment & Plan:   Principal Problem:   AKI (acute kidney injury) ([place]) Active Problems:   Hyperkalemia   Normocytic anemia   Hypoalbuminemia   Sinus bradycardia   Tobacco use   Vertigo   Diplopia   Severe AKI  versus progressive CKD: Had hematuria.  Work-up showed anti-MPO positivity.  Status post renal biopsy in 3/12.  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  Received bolus steroids for 3 days now on prednisone 60 mg daily, Cytoxan 125 mg daily. Started on bactrim.He is  Nonoliguric.  Hepatitis/HIV negative.  PPD negative.  Needs outpatient hemodialysis, insurance is a barrier.  No plan for AV fistula yet, hopeful for recovery.  Hyperkalemia: Resolved with Lokelma in dialysis  Hypertension: Currently blood pressure fairly stable.  Continue to monitor .  Currently on amlodipine  Vertigo/diplopia: Overall improved.  MRI did not show any acute findings.  Chronic normocytic anemia: Stable.  Associated with CKD.  Continue ESA.  Currently CBC stable  Leukocytosis: Suspected to be reactive,associated with steroids.  No fever.  Continue to monitor.  No indication for antibiotic therapy.  Sinus bradycardia: Stable  Tobacco use: Cessation advised.  He says he smokes 2 to 3 packs of cigarettes a day.  Low albumin : Likely in the setting of nephrotic syndrome/CKD.         DVT  prophylaxis: SCD, compression stockings Code Status: Full Family Communication: None present at the bedside.  Plan discussed with the patient Disposition Plan: Patient is from home.  Pending renal biopsy/clearance from nephrology for discharge.  Anticipate discharge plan to homeless shelter after nephrology clearance, might need outpatient dialysis center.   Consultants: Nephrology  Procedures: Dialysis catheter placement  Antimicrobials:  Anti-infectives (From admission, onward)   Start     Dose/Rate Route Frequency Ordered Stop   03/12/19 1536  ceFAZolin (ANCEF) 2-4 GM/100ML-% IVPB    Note to Pharmacy: Rodney Booze   : cabinet override      03/12/19 1536 03/12/19 1606   03/12/19 1300  sulfamethoxazole-trimethoprim (BACTRIM) 400-80 MG per tablet 1 tablet     1 tablet Oral Daily 03/12/19 1259     03/12/19 0800  ceFAZolin (ANCEF) IVPB 2g/100 mL premix     2 g 200 mL/hr over 30 Minutes Intravenous  Once 03/11/19 1615 03/12/19 1610      Subjective: Patient seen and examined at the bedside this morning.  Hemodynamically stable.  As yesterday, he is very upset and angry because his renal biopsy is still pending.  He desperately wants to leave the hospital.  Objective: Vitals:   03/25/19 1230 03/25/19 1620 03/25/19 2104 03/26/19 0613  BP: (!) 153/86 (!) 150/82 (!) 157/91 (!) 176/88  Pulse: 68 74 62 63  Resp: 18 17 18 20   Temp: 98.3 F (36.8 C) 98 F (36.7 C) (!) 95 F (35 C) 98.3 F (36.8 C)  TempSrc: Oral Oral Oral Oral  SpO2: 95% 96% 95% 94%  Weight:      Height:        Intake/Output Summary (Last 24 hours) at 03/26/2019 7893 Last data filed at 03/25/2019 1702 Gross per 24 hour  Intake 300 ml  Output 400 ml  Net -100 ml   Filed Weights   03/24/19 1340 03/24/19 1752 03/24/19 2104  Weight: 77.5 kg 77.4 kg 73.4 kg    Examination:  General exam: Comfortable,sleeping Denied physical examination    Data Reviewed: I have personally reviewed following labs and  imaging studies  CBC: Recent Labs  Lab 03/20/19 0411 03/23/19 0223 03/24/19 1822 03/25/19 0744  WBC 12.1* 15.3* 15.0* 13.9*  NEUTROABS  --   --   --  12.9*  HGB 8.5* 8.4* 9.0* 9.2*  HCT 26.4* 25.6* 27.4* 28.2*  MCV 90.1 89.5 91.3 91.6  PLT 180 224 178 810   Basic Metabolic Panel: Recent Labs  Lab 03/20/19 0411 03/21/19 0635 03/22/19 0440 03/23/19 0223 03/24/19 0242  NA 143 141 140 141 142  K 3.9 4.0 4.2 4.5 4.8  CL 106 106 105 108 108  CO2 24 23 21* 21* 21*  GLUCOSE 82 102* 95 120* 102*  BUN 72* 84* 93* 93* 101*  CREATININE 6.37* 7.20* 7.79* 7.63* 7.54*  CALCIUM 7.3* 7.9* 8.0* 7.9* 8.2*  PHOS 4.9* 5.1* 5.6* 5.8* 6.3*   GFR: Estimated Creatinine Clearance: 9.4 mL/min (A) (by C-G formula based on SCr of 7.54 mg/dL (H)). Liver Function Tests: Recent Labs  Lab 03/20/19 0411 03/21/19 1751 03/22/19 0440 03/23/19 0223 03/24/19 0242  ALBUMIN 2.3* 2.5* 2.4* 2.4* 2.5*   No results for input(s): LIPASE, AMYLASE in the last 168 hours. No results for input(s): AMMONIA in the last 168 hours. Coagulation Profile: No results for input(s): INR, PROTIME in the last 168 hours. Cardiac Enzymes: No results for input(s): CKTOTAL, CKMB, CKMBINDEX, TROPONINI in the last 168 hours. BNP (last 3 results) No results for input(s): PROBNP in the last 8760 hours. HbA1C: No results for input(s): HGBA1C in the last 72 hours. CBG: Recent Labs  Lab 03/25/19 0638 03/25/19 1116 03/25/19 1626 03/25/19 2148 03/26/19 0651  GLUCAP 155* 107* 160* 117* 104*   Lipid Profile: No results for input(s): CHOL, HDL, LDLCALC, TRIG, CHOLHDL, LDLDIRECT in the last 72 hours. Thyroid Function Tests: No results for input(s): TSH, T4TOTAL, FREET4, T3FREE, THYROIDAB in the last 72 hours. Anemia Panel: No results for input(s): VITAMINB12, FOLATE, FERRITIN, TIBC, IRON, RETICCTPCT in the last 72 hours. Sepsis Labs: No results for input(s): PROCALCITON, LATICACIDVEN in the last 168 hours.  No results  found for this or any previous visit (from the past 240 hour(s)).       Radiology Studies: No results found.      Scheduled Meds: . amLODipine  5 mg Oral Daily  . Chlorhexidine Gluconate Cloth  6 each Topical Q0600  . Chlorhexidine Gluconate Cloth  6 each Topical Q0600  . cyclophosphamide  125 mg Oral Daily  . darbepoetin (ARANESP) injection - NON-DIALYSIS  100 mcg Subcutaneous Q Sun-1800  . heparin injection (subcutaneous)  5,000 Units Subcutaneous Q8H  . insulin aspart  0-9 Units Subcutaneous TID WC  . predniSONE  60 mg Oral Q breakfast  . sulfamethoxazole-trimethoprim  1 tablet Oral Daily   Continuous Infusions:   LOS: 15 days    Time spent: 25 mins.More than 50% of that time was spent in counseling and/or coordination of care.      Shelly Coss, MD [facility] P3/24/2021, 8:12 AM

## 2019-03-27 LAB — RENAL FUNCTION PANEL
Albumin: 2.8 g/dL — ABNORMAL LOW (ref 3.5–5.0)
Anion gap: 13 (ref 5–15)
BUN: 84 mg/dL — ABNORMAL HIGH (ref 6–20)
CO2: 21 mmol/L — ABNORMAL LOW (ref 22–32)
Calcium: 8.2 mg/dL — ABNORMAL LOW (ref 8.9–10.3)
Chloride: 105 mmol/L (ref 98–111)
Creatinine, Ser: 6.46 mg/dL — ABNORMAL HIGH (ref 0.61–1.24)
GFR calc Af Amer: 10 mL/min — ABNORMAL LOW (ref >60)
GFR calc non Af Amer: 9 mL/min — ABNORMAL LOW (ref >60)
Glucose, Bld: 135 mg/dL — ABNORMAL HIGH (ref 70–99)
Phosphorus: 5.5 mg/dL — ABNORMAL HIGH (ref 2.5–4.6)
Potassium: 4.1 mmol/L (ref 3.5–5.1)
Sodium: 139 mmol/L (ref 135–145)

## 2019-03-27 LAB — GLUCOSE, CAPILLARY
Glucose-Capillary: 97 mg/dL (ref 70–99)
Glucose-Capillary: 94 mg/dL (ref 70–99)
Glucose-Capillary: 170 mg/dL — ABNORMAL HIGH (ref 70–99)
Glucose-Capillary: 135 mg/dL — ABNORMAL HIGH (ref 70–99)

## 2019-03-27 MED ORDER — PANTOPRAZOLE SODIUM 20 MG PO TBEC
20.0000 mg | DELAYED_RELEASE_TABLET | Freq: Every day | ORAL | Status: AC
  Administered 2019-03-27: 20 mg via ORAL
  Filled 2019-03-27: qty 1

## 2019-03-27 NOTE — Progress Notes (Signed)
Gabriel Harris Progress Note    Assessment/ Plan:   Pt is a 59 y.o. yo male homeless smoker who was admitted on 03/10/2019 with N/V and dizziness- found to have a crt of 10- no old data   Assessment/Plan: 1. Renal-  AKI vs CKD.  Had hematuria- work up showed anti MPO markedly positive.  S/p renal biopsy on 3/12-  Got a preliminary report.  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  Felt there was salvageable kidney tissue to attempt treatment- final report reviewed and does look like not a lot of scarring and reasonable to expect some recovery.  Received bolus steroids for 3 days now on pred 60 daily.  Started oral cytoxan 125 mg daily on 3/10.  Required HD on 3/10 and 3/12.  Nonoliguric.  HD for clearance 3/16.  Hep and HIV negative, getting quantiferon gold--> indeterminate (? suboptimal specimen handling).  PPD placed 3/18, negative 3/20.  Also checked CT chest w/o contrast--> no evidence of vasculitis, some pleural effusions.  We are starting to look for AKI outpt HD chair- pt not happy about that is finally agreeable- insurance is the big barrier now.  Last HD 3/22, plan next tomorrow but 2nd shift so we can review labs/UOP and cancel if recovery noted.    2. HTN/vol- BP up a little- added amlodipine 5 mg daily 3/22, titrate to 10mg   3. Anemia- iron stores OK, on aranesp 100 mcg q Sunday  dispo - main issue here; asked SW to look into possibility of funding for meds outpt (particularly cytoxan).  Without meds he will surely remain dialysis dependent.   Subjective:    UOP 0.3L yesterday, no complaints except continued need for hospitalization.     Objective:   BP (!) 165/91   Pulse 63   Temp 98 F (36.7 C) (Oral)   Resp 20   Ht 5\' 2"  (1.575 m)   Wt 73.4 kg   SpO2 97%   BMI 29.59 kg/m   Intake/Output Summary (Last 24 hours) at 03/27/2019 1338 Last data filed at  03/27/2019 0600 Gross per 24 hour  Intake 480 ml  Output 300 ml  Net 180 ml   Weight change:   Physical Exam: Gen: NAD, sitting in bed CVS: RRR no m/r/g Resp: clear bilaterally.  Abd: soft nontender Ext: trace LE edema ACCESS: R IJ TDC  Imaging: No results found.  Labs: BMET Recent Labs  Lab 03/21/19 0635 03/22/19 0440 03/23/19 0223 03/24/19 0242 03/26/19 0849 03/27/19 0813  NA 141 140 141 142 141 139  K 4.0 4.2 4.5 4.8 4.1 4.1  CL 106 105 108 108 106 105  CO2 23 21* 21* 21* 22 21*  GLUCOSE 102* 95 120* 102* 108* 135*  BUN 84* 93* 93* 101* 76* 84*  CREATININE 7.20* 7.79* 7.63* 7.54* 6.24* 6.46*  CALCIUM 7.9* 8.0* 7.9* 8.2* 8.3* 8.2*  PHOS 5.1* 5.6* 5.8* 6.3* 5.6* 5.5*   CBC Recent Labs  Lab 03/23/19 0223 03/24/19 1822 03/25/19 0744  WBC 15.3* 15.0* 13.9*  NEUTROABS  --   --  12.9*  HGB 8.4* 9.0* 9.2*  HCT 25.6* 27.4* 28.2*  MCV 89.5 91.3 91.6  PLT 224 178 183    Medications:    . amLODipine  10 mg Oral Daily  . Chlorhexidine Gluconate Cloth  6 each Topical Q0600  . Chlorhexidine Gluconate Cloth  6 each Topical Q0600  .  cyclophosphamide  125 mg Oral Daily  . darbepoetin (ARANESP) injection - NON-DIALYSIS  100 mcg Subcutaneous Q Sun-1800  . heparin injection (subcutaneous)  5,000 Units Subcutaneous Q8H  . insulin aspart  0-9 Units Subcutaneous TID WC  . predniSONE  60 mg Oral Q breakfast  . sulfamethoxazole-trimethoprim  1 tablet Oral Daily     Jannifer Hick MD [facility] Pager [phone]

## 2019-03-27 NOTE — Progress Notes (Signed)
PROGRESS NOTE    Gabriel Harris  JGG:[id number] DOB: 1960-11-10 DOA: 03/10/2019 PCP: Patient, No Pcp Per   Brief Narrative: Patient is a 59 year old male with history of tobacco abuse who presented to the emergency department with 2 weeks history of worsening dizziness, nausea, tinnitus, diplopia, diarrhea, vomiting.  On presentation, MRI was negative for any acute intracranial findings but he was found to have severe AKI with creatinine greater than 10.  Started on IV fluids, nephrology consulted.  Started on hemodialysis.  He had renal biopsy on 3/11, prelim report suggestive of possible ANCA vasculitis.  Anticipating spontaneous renal recovery but needs assistance with immunosuppressants.  Social Building services engineer.  Assessment & Plan:   Principal Problem:   AKI (acute kidney injury) ([place]) Active Problems:   Hyperkalemia   Normocytic anemia   Hypoalbuminemia   Sinus bradycardia   Tobacco use   Vertigo   Diplopia   Severe AKI  versus progressive CKD: Had hematuria.    Status post renal biopsy in 3/12.  Showed acute focal crescentic glomerulonephritis.  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  Received bolus steroids for 3 days now on prednisone 60 mg daily, Cytoxan 125 mg daily. Started on bactrim.He is  Nonoliguric.  Hepatitis/HIV negative.  PPD negative.    No plan for AV fistula yet, hopeful for recovery but needs immunosuppressants.  Social worker assisting for medication assistance.  Hyperkalemia: Resolved with Lokelma in dialysis  Hypertension: Currently blood pressure fairly stable.  Continue to monitor .  Currently on amlodipine  Vertigo/diplopia: Overall improved.  MRI did not show any acute findings.  Chronic normocytic anemia: Stable.  Associated with CKD.  Continue ESA.  Currently CBC stable  Leukocytosis: Suspected to be reactive,associated with steroids.  No fever.  Continue to monitor.  No indication for antibiotic therapy.  Sinus bradycardia: Stable  Tobacco use: Cessation  advised.  He says he smokes 2 to 3 packs of cigarettes a day.  Low albumin : Likely in the setting of nephrotic syndrome/CKD.         DVT prophylaxis: SCD, compression stockings Code Status: Full Family Communication: None present at the bedside.  Plan discussed with the patient Disposition Plan: Patient is from home.  Pending renal biopsy/clearance from nephrology for discharge.  Anticipate discharge plan  after nephrology clearance, might need outpatient dialysis center and needs immunosuppressants coverage.   Consultants: Nephrology  Procedures: Dialysis catheter placement  Antimicrobials:  Anti-infectives (From admission, onward)   Start     Dose/Rate Route Frequency Ordered Stop   03/12/19 1536  ceFAZolin (ANCEF) 2-4 GM/100ML-% IVPB    Note to Pharmacy: Rodney Booze   : cabinet override      03/12/19 1536 03/12/19 1606   03/12/19 1300  sulfamethoxazole-trimethoprim (BACTRIM) 400-80 MG per tablet 1 tablet     1 tablet Oral Daily 03/12/19 1259     03/12/19 0800  ceFAZolin (ANCEF) IVPB 2g/100 mL premix     2 g 200 mL/hr over 30 Minutes Intravenous  Once 03/11/19 1615 03/12/19 1610      Subjective: Patient seen and examined the bedside this morning.  His  mood is better today.  Apologized for yesterday's behavior.  Denies any complaints.  Objective: Vitals:   03/26/19 0613 03/26/19 1637 03/26/19 2059 03/27/19 0437  BP: (!) 176/88 (!) 141/82 (!) 149/91 (!) 165/91  Pulse: 63 64 70 63  Resp: 20 18 20 20   Temp: 98.3 F (36.8 C) 98.2 F (36.8 C) 98 F (36.7 C) 98 F (36.7 C)  TempSrc: Oral  Oral Oral Oral  SpO2: 94% 97% 98% 97%  Weight:      Height:        Intake/Output Summary (Last 24 hours) at 03/27/2019 0800 Last data filed at 03/27/2019 0600 Gross per 24 hour  Intake 840 ml  Output 300 ml  Net 540 ml   Filed Weights   03/24/19 1340 03/24/19 1752 03/24/19 2104  Weight: 77.5 kg 77.4 kg 73.4 kg    Examination:  General exam: Appears calm and  comfortable ,Not in distress,average built HEENT:PERRL,Oral mucosa moist, Ear/Nose normal on gross exam Respiratory system: Bilateral equal air entry, normal vesicular breath sounds, no wheezes or crackles  Cardiovascular system: S1 & S2 heard, RRR. No JVD, murmurs, rubs, gallops or clicks.  Dialysis catheter on the right chest Gastrointestinal system: Abdomen is nondistended, soft and nontender. No organomegaly or masses felt. Normal bowel sounds heard. Central nervous system: Alert and oriented. No focal neurological deficits. Extremities: No edema, no clubbing ,no cyanosis Skin: No rashes, lesions or ulcers,no icterus ,no pallor    Data Reviewed: I have personally reviewed following labs and imaging studies  CBC: Recent Labs  Lab 03/23/19 0223 03/24/19 1822 03/25/19 0744  WBC 15.3* 15.0* 13.9*  NEUTROABS  --   --  12.9*  HGB 8.4* 9.0* 9.2*  HCT 25.6* 27.4* 28.2*  MCV 89.5 91.3 91.6  PLT 224 178 604   Basic Metabolic Panel: Recent Labs  Lab 03/21/19 0635 03/22/19 0440 03/23/19 0223 03/24/19 0242 03/26/19 0849  NA 141 140 141 142 141  K 4.0 4.2 4.5 4.8 4.1  CL 106 105 108 108 106  CO2 23 21* 21* 21* 22  GLUCOSE 102* 95 120* 102* 108*  BUN 84* 93* 93* 101* 76*  CREATININE 7.20* 7.79* 7.63* 7.54* 6.24*  CALCIUM 7.9* 8.0* 7.9* 8.2* 8.3*  PHOS 5.1* 5.6* 5.8* 6.3* 5.6*   GFR: Estimated Creatinine Clearance: 11.3 mL/min (A) (by C-G formula based on SCr of 6.24 mg/dL (H)). Liver Function Tests: Recent Labs  Lab 03/21/19 0635 03/22/19 0440 03/23/19 0223 03/24/19 0242 03/26/19 0849  ALBUMIN 2.5* 2.4* 2.4* 2.5* 2.7*   No results for input(s): LIPASE, AMYLASE in the last 168 hours. No results for input(s): AMMONIA in the last 168 hours. Coagulation Profile: No results for input(s): INR, PROTIME in the last 168 hours. Cardiac Enzymes: No results for input(s): CKTOTAL, CKMB, CKMBINDEX, TROPONINI in the last 168 hours. BNP (last 3 results) No results for input(s):  PROBNP in the last 8760 hours. HbA1C: No results for input(s): HGBA1C in the last 72 hours. CBG: Recent Labs  Lab 03/26/19 0651 03/26/19 1125 03/26/19 1636 03/26/19 2145 03/27/19 0652  GLUCAP 104* 99 142* 203* 97   Lipid Profile: No results for input(s): CHOL, HDL, LDLCALC, TRIG, CHOLHDL, LDLDIRECT in the last 72 hours. Thyroid Function Tests: No results for input(s): TSH, T4TOTAL, FREET4, T3FREE, THYROIDAB in the last 72 hours. Anemia Panel: No results for input(s): VITAMINB12, FOLATE, FERRITIN, TIBC, IRON, RETICCTPCT in the last 72 hours. Sepsis Labs: No results for input(s): PROCALCITON, LATICACIDVEN in the last 168 hours.  No results found for this or any previous visit (from the past 240 hour(s)).       Radiology Studies: No results found.      Scheduled Meds: . amLODipine  10 mg Oral Daily  . Chlorhexidine Gluconate Cloth  6 each Topical Q0600  . Chlorhexidine Gluconate Cloth  6 each Topical Q0600  . cyclophosphamide  125 mg Oral Daily  . darbepoetin (ARANESP) injection -  NON-DIALYSIS  100 mcg Subcutaneous Q Sun-1800  . heparin injection (subcutaneous)  5,000 Units Subcutaneous Q8H  . insulin aspart  0-9 Units Subcutaneous TID WC  . pantoprazole  20 mg Oral Daily  . predniSONE  60 mg Oral Q breakfast  . sulfamethoxazole-trimethoprim  1 tablet Oral Daily   Continuous Infusions:   LOS: 16 days    Time spent: 25 mins.More than 50% of that time was spent in counseling and/or coordination of care.      Shelly Coss, MD [facility] P3/25/2021, 8:00 AM

## 2019-03-28 LAB — RENAL FUNCTION PANEL
Albumin: 2.7 g/dL — ABNORMAL LOW (ref 3.5–5.0)
Anion gap: 13 (ref 5–15)
BUN: 90 mg/dL — ABNORMAL HIGH (ref 6–20)
CO2: 23 mmol/L (ref 22–32)
Calcium: 8.6 mg/dL — ABNORMAL LOW (ref 8.9–10.3)
Chloride: 106 mmol/L (ref 98–111)
Creatinine, Ser: 7.1 mg/dL — ABNORMAL HIGH (ref 0.61–1.24)
GFR calc Af Amer: 9 mL/min — ABNORMAL LOW (ref >60)
GFR calc non Af Amer: 8 mL/min — ABNORMAL LOW (ref >60)
Glucose, Bld: 121 mg/dL — ABNORMAL HIGH (ref 70–99)
Phosphorus: 6.1 mg/dL — ABNORMAL HIGH (ref 2.5–4.6)
Potassium: 4.8 mmol/L (ref 3.5–5.1)
Sodium: 142 mmol/L (ref 135–145)

## 2019-03-28 LAB — GLUCOSE, CAPILLARY
Glucose-Capillary: 91 mg/dL (ref 70–99)
Glucose-Capillary: 85 mg/dL (ref 70–99)
Glucose-Capillary: 114 mg/dL — ABNORMAL HIGH (ref 70–99)

## 2019-03-28 NOTE — Progress Notes (Signed)
PROGRESS NOTE    Gabriel Harris  YTK:[id number] DOB: 1960/09/27 DOA: 03/10/2019 PCP: Patient, No Pcp Per   Brief Narrative: Patient is a 59 year old male with history of tobacco abuse who presented to the emergency department with 2 weeks history of worsening dizziness, nausea, tinnitus, diplopia, diarrhea, vomiting.  On presentation, MRI was negative for any acute intracranial findings but he was found to have severe AKI with creatinine greater than 10.  Started on IV fluids, nephrology consulted.  Started on hemodialysis.  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 He has been started  immunosuppressants.  There was no evidence of renal recovery, so he  will probably be on dialysis on permanent basis.  Process started for outpatient dialysis unit placement.  Plan for dialysis today.  Assessment & Plan:   Principal Problem:   AKI (acute kidney injury) ([place]) Active Problems:   Hyperkalemia   Normocytic anemia   Hypoalbuminemia   Sinus bradycardia   Tobacco use   Vertigo   Diplopia   Severe AKI  versus progressive CKD: Had hematuria on presentation.   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 He has been started  immunosuppressants.  There was no evidence of renal recovery, so he  will probably be on dialysis on permanent basis.  Process started for outpatient dialysis unit placement.  Plan for dialysis today. Received bolus steroids for 3 days now on prednisone 60 mg daily, Cytoxan 125 mg daily. Started on bactrim.He is  Nonoliguric.  Hepatitis/HIV negative.  PPD negative.    Hyperkalemia: Resolved with Lokelma and with dialysis  Hypertension: Currently blood pressure fairly stable.  Continue to monitor. Currently on amlodipine  Vertigo/diplopia: Overall improved.  MRI did not show any acute findings.  Chronic normocytic anemia:  Associated with CKD.  Continue ESA.  Currently CBC stable  Leukocytosis: Suspected to  be reactive,associated with steroids. Continue to monitor  Sinus bradycardia: Stable  Tobacco use: Cessation advised.  He says he smokes 2 to 3 packs of cigarettes a day.  Low albumin : Likely in the setting of nephrotic syndrome/CKD.         DVT prophylaxis: SCD, compression stockings Code Status: Full Family Communication: None present at the bedside.  Plan discussed with the patient Disposition Plan: Patient is from home.  Pending clearance from nephrology for discharge.  Anticipate discharge plan  after nephrology clearance, needs  outpatient dialysis center . Case manager  also assisting with his immunosuppressants medications cost  Consultants: Nephrology  Procedures: Dialysis catheter placement  Antimicrobials:  Anti-infectives (From admission, onward)   Start     Dose/Rate Route Frequency Ordered Stop   03/12/19 1536  ceFAZolin (ANCEF) 2-4 GM/100ML-% IVPB    Note to Pharmacy: Rodney Booze   : cabinet override      03/12/19 1536 03/12/19 1606   03/12/19 1300  sulfamethoxazole-trimethoprim (BACTRIM) 400-80 MG per tablet 1 tablet     1 tablet Oral Daily 03/12/19 1259     03/12/19 0800  ceFAZolin (ANCEF) IVPB 2g/100 mL premix     2 g 200 mL/hr over 30 Minutes Intravenous  Once 03/11/19 1615 03/12/19 1610      Subjective: Patient seen and examined the bedside this morning.  Hemodynamically stable.  Sleeping on the couch.  Denies any complaints.  As always, eager to go home.  He reported loss of a family member  Objective: Vitals:   03/27/19 1032 03/27/19 2021 03/28/19 0441 03/28/19 0830  BP: (!) 165/91 (!) 158/92 (!) 179/89 126/82  Pulse: 79 67 63 69  Resp: 18 18 16 18   Temp: 98.2 F (36.8 C) 97.9 F (36.6 C) 97.9 F (36.6 C) 98 F (36.7 C)  TempSrc: Oral Oral Oral Oral  SpO2: 99% 98% 99% 97%  Weight:  72.1 kg    Height:        Intake/Output Summary (Last 24 hours) at 03/28/2019 0833 Last data filed at 03/28/2019 0538 Gross per 24 hour  Intake 720 ml    Output 600 ml  Net 120 ml   Filed Weights   03/24/19 1752 03/24/19 2104 03/27/19 2021  Weight: 77.4 kg 73.4 kg 72.1 kg    Examination:  General exam:Comfortable HEENT:PERRL,Oral mucosa moist, Ear/Nose normal on gross exam Respiratory system: Bilateral equal air entry, normal vesicular breath sounds, no wheezes or crackles  Cardiovascular system: S1 & S2 heard, RRR. No JVD, murmurs, rubs, gallops or clicks.  Temporary dialysis catheter on the right chest Gastrointestinal system: Abdomen is nondistended, soft and nontender. No organomegaly or masses felt. Normal bowel sounds heard. Central nervous system: Alert and oriented. No focal neurological deficits. Extremities: No edema, no clubbing ,no cyanosis Skin: No rashes, lesions or ulcers,no icterus ,no pallor    Data Reviewed: I have personally reviewed following labs and imaging studies  CBC: Recent Labs  Lab 03/23/19 0223 03/24/19 1822 03/25/19 0744  WBC 15.3* 15.0* 13.9*  NEUTROABS  --   --  12.9*  HGB 8.4* 9.0* 9.2*  HCT 25.6* 27.4* 28.2*  MCV 89.5 91.3 91.6  PLT 224 178 841   Basic Metabolic Panel: Recent Labs  Lab 03/23/19 0223 03/24/19 0242 03/26/19 0849 03/27/19 0813 03/28/19 0519  NA 141 142 141 139 142  K 4.5 4.8 4.1 4.1 4.8  CL 108 108 106 105 106  CO2 21* 21* 22 21* 23  GLUCOSE 120* 102* 108* 135* 121*  BUN 93* 101* 76* 84* 90*  CREATININE 7.63* 7.54* 6.24* 6.46* 7.10*  CALCIUM 7.9* 8.2* 8.3* 8.2* 8.6*  PHOS 5.8* 6.3* 5.6* 5.5* 6.1*   GFR: Estimated Creatinine Clearance: 9.9 mL/min (A) (by C-G formula based on SCr of 7.1 mg/dL (H)). Liver Function Tests: Recent Labs  Lab 03/23/19 0223 03/24/19 0242 03/26/19 0849 03/27/19 0813 03/28/19 0519  ALBUMIN 2.4* 2.5* 2.7* 2.8* 2.7*   No results for input(s): LIPASE, AMYLASE in the last 168 hours. No results for input(s): AMMONIA in the last 168 hours. Coagulation Profile: No results for input(s): INR, PROTIME in the last 168 hours. Cardiac  Enzymes: No results for input(s): CKTOTAL, CKMB, CKMBINDEX, TROPONINI in the last 168 hours. BNP (last 3 results) No results for input(s): PROBNP in the last 8760 hours. HbA1C: No results for input(s): HGBA1C in the last 72 hours. CBG: Recent Labs  Lab 03/27/19 0652 03/27/19 1115 03/27/19 1639 03/27/19 2023 03/28/19 0635  GLUCAP 97 94 170* 135* 91   Lipid Profile: No results for input(s): CHOL, HDL, LDLCALC, TRIG, CHOLHDL, LDLDIRECT in the last 72 hours. Thyroid Function Tests: No results for input(s): TSH, T4TOTAL, FREET4, T3FREE, THYROIDAB in the last 72 hours. Anemia Panel: No results for input(s): VITAMINB12, FOLATE, FERRITIN, TIBC, IRON, RETICCTPCT in the last 72 hours. Sepsis Labs: No results for input(s): PROCALCITON, LATICACIDVEN in the last 168 hours.  No results found for this or any previous visit (from the past 240 hour(s)).       Radiology Studies: No results found.      Scheduled Meds: . amLODipine  10  mg Oral Daily  . Chlorhexidine Gluconate Cloth  6 each Topical Q0600  . Chlorhexidine Gluconate Cloth  6 each Topical Q0600  . cyclophosphamide  125 mg Oral Daily  . darbepoetin (ARANESP) injection - NON-DIALYSIS  100 mcg Subcutaneous Q Sun-1800  . heparin injection (subcutaneous)  5,000 Units Subcutaneous Q8H  . insulin aspart  0-9 Units Subcutaneous TID WC  . predniSONE  60 mg Oral Q breakfast  . sulfamethoxazole-trimethoprim  1 tablet Oral Daily   Continuous Infusions:   LOS: 17 days    Time spent: 25 mins.More than 50% of that time was spent in counseling and/or coordination of care.      Shelly Coss, MD [facility] P3/26/2021, 8:33 AM

## 2019-03-28 NOTE — Progress Notes (Signed)
Patient ID: Gabriel Harris, male   DOB: Jan 18, 1960, 59 y.o.   MRN: [id number]  [facility] Progress Note   Assessment/ Plan:   1. Acute kidney Injury: With unknown baseline renal function.  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  Limited sclerosis raising some optimism regarding renal recovery with immunosuppressive therapy-started on cyclophosphamide and corticosteroids.  Although he is nonoliguric, he does not have any evidence of renal recovery on labs and he will have dialysis done today.  Process started for outpatient dialysis unit placement along with attempt to procure cyclophosphamide.  Another question may be as to whether outpatient rituximab infusions may be another option (especially with his current social situation). 2.  Hypertension: Blood pressure currently at goal on amlodipine, monitor with HD. 3.  Anemia: Iron stores are repleted he is on Aranesp. 4.  Hyperphosphatemia: Secondary to acute kidney injury and impaired phosphorus handling, monitor with HD.  Subjective:   Reports that he feels fair and would like to try and get out of the hospital as soon as he can because of a "death in the family".   Objective:   BP 126/82 (BP Location: Right Arm)   Pulse 69   Temp 98 F (36.7 C) (Oral)   Resp 18   Ht 5\' 2"  (1.575 m)   Wt 72.1 kg   SpO2 97%   BMI 29.06 kg/m   Intake/Output Summary (Last 24 hours) at 03/28/2019 1052 Last data filed at 03/28/2019 0848 Gross per 24 hour  Intake 960 ml  Output 600 ml  Net 360 ml   Weight change:   Physical Exam: Gen: Comfortably resting in couch at bedside CVS: Pulse regular rhythm, normal rate, S1 and S2 normal Resp: Clear to auscultation, no rales/rhonchi.  Right IJ TDC. Abd: Soft, obese, nontender Ext: No lower extremity edema  Imaging: No results  found.  Labs: BMET Recent Labs  Lab 03/22/19 0440 03/23/19 0223 03/24/19 0242 03/26/19 0849 03/27/19 0813 03/28/19 0519  NA 140 141 142 141 139 142  K 4.2 4.5 4.8 4.1 4.1 4.8  CL 105 108 108 106 105 106  CO2 21* 21* 21* 22 21* 23  GLUCOSE 95 120* 102* 108* 135* 121*  BUN 93* 93* 101* 76* 84* 90*  CREATININE 7.79* 7.63* 7.54* 6.24* 6.46* 7.10*  CALCIUM 8.0* 7.9* 8.2* 8.3* 8.2* 8.6*  PHOS 5.6* 5.8* 6.3* 5.6* 5.5* 6.1*   CBC Recent Labs  Lab 03/23/19 0223 03/24/19 1822 03/25/19 0744  WBC 15.3* 15.0* 13.9*  NEUTROABS  --   --  12.9*  HGB 8.4* 9.0* 9.2*  HCT 25.6* 27.4* 28.2*  MCV 89.5 91.3 91.6  PLT 224 178 183    Medications:    . amLODipine  10 mg Oral Daily  . Chlorhexidine Gluconate Cloth  6 each Topical Q0600  . Chlorhexidine Gluconate Cloth  6 each Topical Q0600  . cyclophosphamide  125 mg Oral Daily  . darbepoetin (ARANESP) injection - NON-DIALYSIS  100 mcg Subcutaneous Q Sun-1800  . heparin injection (subcutaneous)  5,000 Units Subcutaneous Q8H  . insulin aspart  0-9 Units Subcutaneous TID WC  . predniSONE  60 mg Oral Q breakfast  . sulfamethoxazole-trimethoprim  1 tablet Oral Daily   Elmarie Shiley, MD 03/28/2019, 10:52 AM

## 2019-03-29 LAB — RENAL FUNCTION PANEL
Albumin: 2.8 g/dL — ABNORMAL LOW (ref 3.5–5.0)
Anion gap: 12 (ref 5–15)
BUN: 43 mg/dL — ABNORMAL HIGH (ref 6–20)
CO2: 27 mmol/L (ref 22–32)
Calcium: 8.3 mg/dL — ABNORMAL LOW (ref 8.9–10.3)
Chloride: 103 mmol/L (ref 98–111)
Creatinine, Ser: 4.66 mg/dL — ABNORMAL HIGH (ref 0.61–1.24)
GFR calc Af Amer: 15 mL/min — ABNORMAL LOW (ref >60)
GFR calc non Af Amer: 13 mL/min — ABNORMAL LOW (ref >60)
Glucose, Bld: 79 mg/dL (ref 70–99)
Phosphorus: 5.8 mg/dL — ABNORMAL HIGH (ref 2.5–4.6)
Potassium: 4.5 mmol/L (ref 3.5–5.1)
Sodium: 142 mmol/L (ref 135–145)

## 2019-03-29 LAB — CBC WITH DIFFERENTIAL/PLATELET
Abs Immature Granulocytes: 0.08 10*3/uL — ABNORMAL HIGH (ref 0.00–0.07)
Basophils Absolute: 0 10*3/uL (ref 0.0–0.1)
Basophils Relative: 0 %
Eosinophils Absolute: 0.1 10*3/uL (ref 0.0–0.5)
Eosinophils Relative: 1 %
HCT: 30.2 % — ABNORMAL LOW (ref 39.0–52.0)
Hemoglobin: 9.7 g/dL — ABNORMAL LOW (ref 13.0–17.0)
Immature Granulocytes: 1 %
Lymphocytes Relative: 6 %
Lymphs Abs: 0.6 10*3/uL — ABNORMAL LOW (ref 0.7–4.0)
MCH: 30.4 pg (ref 26.0–34.0)
MCHC: 32.1 g/dL (ref 30.0–36.0)
MCV: 94.7 fL (ref 80.0–100.0)
Monocytes Absolute: 0.8 10*3/uL (ref 0.1–1.0)
Monocytes Relative: 7 %
Neutro Abs: 9 10*3/uL — ABNORMAL HIGH (ref 1.7–7.7)
Neutrophils Relative %: 85 %
Platelets: 180 10*3/uL (ref 150–400)
RBC: 3.19 MIL/uL — ABNORMAL LOW (ref 4.22–5.81)
RDW: 16.6 % — ABNORMAL HIGH (ref 11.5–15.5)
WBC: 10.6 10*3/uL — ABNORMAL HIGH (ref 4.0–10.5)
nRBC: 0.3 % — ABNORMAL HIGH (ref 0.0–0.2)

## 2019-03-29 LAB — GLUCOSE, CAPILLARY
Glucose-Capillary: 81 mg/dL (ref 70–99)
Glucose-Capillary: 94 mg/dL (ref 70–99)
Glucose-Capillary: 165 mg/dL — ABNORMAL HIGH (ref 70–99)
Glucose-Capillary: 189 mg/dL — ABNORMAL HIGH (ref 70–99)

## 2019-03-29 MED ORDER — HYDRALAZINE HCL 25 MG PO TABS
25.0000 mg | ORAL_TABLET | Freq: Three times a day (TID) | ORAL | Status: DC
  Administered 2019-03-29 – 2019-04-04 (×19): 25 mg via ORAL
  Filled 2019-03-29 (×19): qty 1

## 2019-03-29 NOTE — Progress Notes (Signed)
Patient ID: Gabriel Harris, male   DOB: 1960-12-28, 59 y.o.   MRN: [id number] [facility] Progress Note   Assessment/ Plan:   1. Acute kidney Injury: Unknown renal baseline.  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  In spite of his elevated creatinine, he had limited scarring indicating salvageable renal tissue for which she was started on cyclophosphamide and corticosteroids.  He is nonoliguric and underwent dialysis yesterday uneventfully.  Process started for outpatient dialysis unit placement along with attempt to procure cyclophosphamide.  Another question may be as to whether outpatient rituximab infusions may be another option (especially with his current social situation). 2.  Hypertension: Blood pressure intermittently elevated on amlodipine, monitor with HD to decide on need for additional agent. 3.  Anemia: Iron stores are repleted.  He is on Aranesp. 4.  Hyperphosphatemia: Secondary to acute kidney injury and impaired phosphorus handling, monitor with HD.  Subjective:   Anxious to leave and go home, understands barriers.   Objective:   BP (!) 173/92 (BP Location: Right Arm)   Pulse 62   Temp 97.8 F (36.6 C) (Oral)   Resp 15   Ht 5\' 2"  (1.575 m)   Wt 72.3 kg   SpO2 97%   BMI 29.15 kg/m   Intake/Output Summary (Last 24 hours) at 03/29/2019 1048 Last data filed at 03/29/2019 2355 Gross per 24 hour  Intake 720 ml  Output 375 ml  Net 345 ml   Weight change: 0.223 kg  Physical Exam: Gen: Comfortably resting in bed, watching television CVS: Pulse regular rhythm, normal rate, S1 and S2 normal Resp: Clear to auscultation, no rales/rhonchi.  Right IJ TDC. Abd: Soft, obese, nontender Ext: No lower extremity edema  Imaging: No results found.  Labs: BMET Recent Labs  Lab 03/23/19 0223 03/24/19 0242 03/26/19 0849 03/27/19 0813  03/28/19 0519 03/29/19 0402  NA 141 142 141 139 142 142  K 4.5 4.8 4.1 4.1 4.8 4.5  CL 108 108 106 105 106 103  CO2 21* 21* 22 21* 23 27  GLUCOSE 120* 102* 108* 135* 121* 79  BUN 93* 101* 76* 84* 90* 43*  CREATININE 7.63* 7.54* 6.24* 6.46* 7.10* 4.66*  CALCIUM 7.9* 8.2* 8.3* 8.2* 8.6* 8.3*  PHOS 5.8* 6.3* 5.6* 5.5* 6.1* 5.8*   CBC Recent Labs  Lab 03/23/19 0223 03/24/19 1822 03/25/19 0744 03/29/19 0402  WBC 15.3* 15.0* 13.9* 10.6*  NEUTROABS  --   --  12.9* 9.0*  HGB 8.4* 9.0* 9.2* 9.7*  HCT 25.6* 27.4* 28.2* 30.2*  MCV 89.5 91.3 91.6 94.7  PLT 224 178 183 180    Medications:    . amLODipine  10 mg Oral Daily  . Chlorhexidine Gluconate Cloth  6 each Topical Q0600  . Chlorhexidine Gluconate Cloth  6 each Topical Q0600  . cyclophosphamide  125 mg Oral Daily  . darbepoetin (ARANESP) injection - NON-DIALYSIS  100 mcg Subcutaneous Q Sun-1800  . heparin injection (subcutaneous)  5,000 Units Subcutaneous Q8H  . insulin aspart  0-9 Units Subcutaneous TID WC  . predniSONE  60 mg Oral Q breakfast  . sulfamethoxazole-trimethoprim  1 tablet Oral Daily   Elmarie Shiley, MD 03/29/2019, 10:48 AM

## 2019-03-29 NOTE — Progress Notes (Signed)
PROGRESS NOTE    Gabriel Harris  RXV:[id number] DOB: 09/27/1960 DOA: 03/10/2019 PCP: Patient, No Pcp Per   Brief Narrative: Patient is a 59 year old male with history of tobacco abuse who presented to the emergency department with 2 weeks history of worsening dizziness, nausea, tinnitus, diplopia, diarrhea, vomiting.  On presentation, MRI was negative for any acute intracranial findings but he was found to have severe AKI with creatinine greater than 10.  Started on IV fluids, nephrology consulted.  Started on hemodialysis.  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 He has been started  immunosuppressants.  There was no evidence of renal recovery, so he  will probably be on dialysis on permanent basis.  Process started for outpatient dialysis unit placement.  Assessment & Plan:   Principal Problem:   AKI (acute kidney injury) ([place]) Active Problems:   Hyperkalemia   Normocytic anemia   Hypoalbuminemia   Sinus bradycardia   Tobacco use   Vertigo   Diplopia   Severe AKI  versus progressive CKD: Had hematuria on presentation.   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 He has been started  immunosuppressants.  There was no evidence of renal recovery, so he  will probably be on dialysis on permanent basis.  Process started for outpatient dialysis unit placement.  Received bolus steroids for 3 days, now on prednisone 60 mg daily, Cytoxan 125 mg daily. Started on bactrim.He is  Nonoliguric.  Hepatitis/HIV negative.  PPD negative.    Hyperkalemia: Resolved with Lokelma and with dialysis  Hypertension: Hypertensive this morning.  Currently on amlodipine.  We will add hydralazine  Vertigo/diplopia: Overall improved.  MRI did not show any acute findings.  Chronic normocytic anemia:  Associated with CKD.  Continue ESA.  Currently CBC stable  Leukocytosis: Suspected to be reactive,associated with steroids. Continue to  monitor  Sinus bradycardia: Stable  Tobacco use: Cessation advised.  He says he smokes 2 to 3 packs of cigarettes a day.  Low albumin : Likely in the setting of nephrotic syndrome/CKD.         DVT prophylaxis: SCD, compression stockings Code Status: Full Family Communication: None present at the bedside.  Plan discussed with the patient Disposition Plan: Patient is from home. Anticipate discharge plan  after nephrology clearance, needs  outpatient dialysis center . Case manager  also assisting with his immunosuppressants, medications cost  Consultants: Nephrology  Procedures: Dialysis catheter placement  Antimicrobials:  Anti-infectives (From admission, onward)   Start     Dose/Rate Route Frequency Ordered Stop   03/12/19 1536  ceFAZolin (ANCEF) 2-4 GM/100ML-% IVPB    Note to Pharmacy: Rodney Booze   : cabinet override      03/12/19 1536 03/12/19 1606   03/12/19 1300  sulfamethoxazole-trimethoprim (BACTRIM) 400-80 MG per tablet 1 tablet     1 tablet Oral Daily 03/12/19 1259     03/12/19 0800  ceFAZolin (ANCEF) IVPB 2g/100 mL premix     2 g 200 mL/hr over 30 Minutes Intravenous  Once 03/11/19 1615 03/12/19 1610      Subjective: Patient seen and examined at the bedside this morning.  Hemodynamically stable.  Comfortable.  Eager to go home but unfortunately he cannot until nephrology clears  Objective: Vitals:   03/28/19 2006 03/29/19 0103 03/29/19 0447 03/29/19 0806  BP: (!) 162/92  (!) 154/97 (!) 173/92  Pulse: 66  65 62  Resp:    15  Temp: 97.7 F (36.5  C)  97.7 F (36.5 C) 97.8 F (36.6 C)  TempSrc: Oral  Oral Oral  SpO2: 96%  97% 97%  Weight:  72.3 kg    Height:        Intake/Output Summary (Last 24 hours) at 03/29/2019 0807 Last data filed at 03/29/2019 4696 Gross per 24 hour  Intake 960 ml  Output 375 ml  Net 585 ml   Filed Weights   03/28/19 1630 03/28/19 1940 03/29/19 0103  Weight: 72.3 kg 72.3 kg 72.3 kg    Examination:   General exam:  Comfortable HEENT:PERRL,Oral mucosa moist, Ear/Nose normal on gross exam Respiratory system: Bilateral equal air entry, normal vesicular breath sounds, no wheezes or crackles  Cardiovascular system: S1 & S2 heard, RRR. No JVD, murmurs, rubs, gallops or clicks.  Temporary dialysis catheter on the right chest Gastrointestinal system: Abdomen is nondistended, soft and nontender. No organomegaly or masses felt. Normal bowel sounds heard. Central nervous system: Alert and oriented. No focal neurological deficits. Extremities: No edema, no clubbing ,no cyanosis, distal peripheral pulses palpable. Skin: No rashes, lesions or ulcers,no icterus ,no pallor   Data Reviewed: I have personally reviewed following labs and imaging studies  CBC: Recent Labs  Lab 03/23/19 0223 03/24/19 1822 03/25/19 0744 03/29/19 0402  WBC 15.3* 15.0* 13.9* 10.6*  NEUTROABS  --   --  12.9* 9.0*  HGB 8.4* 9.0* 9.2* 9.7*  HCT 25.6* 27.4* 28.2* 30.2*  MCV 89.5 91.3 91.6 94.7  PLT 224 178 183 295   Basic Metabolic Panel: Recent Labs  Lab 03/24/19 0242 03/26/19 0849 03/27/19 0813 03/28/19 0519 03/29/19 0402  NA 142 141 139 142 142  K 4.8 4.1 4.1 4.8 4.5  CL 108 106 105 106 103  CO2 21* 22 21* 23 27  GLUCOSE 102* 108* 135* 121* 79  BUN 101* 76* 84* 90* 43*  CREATININE 7.54* 6.24* 6.46* 7.10* 4.66*  CALCIUM 8.2* 8.3* 8.2* 8.6* 8.3*  PHOS 6.3* 5.6* 5.5* 6.1* 5.8*   GFR: Estimated Creatinine Clearance: 15.1 mL/min (A) (by C-G formula based on SCr of 4.66 mg/dL (H)). Liver Function Tests: Recent Labs  Lab 03/24/19 0242 03/26/19 0849 03/27/19 0813 03/28/19 0519 03/29/19 0402  ALBUMIN 2.5* 2.7* 2.8* 2.7* 2.8*   No results for input(s): LIPASE, AMYLASE in the last 168 hours. No results for input(s): AMMONIA in the last 168 hours. Coagulation Profile: No results for input(s): INR, PROTIME in the last 168 hours. Cardiac Enzymes: No results for input(s): CKTOTAL, CKMB, CKMBINDEX, TROPONINI in the last 168  hours. BNP (last 3 results) No results for input(s): PROBNP in the last 8760 hours. HbA1C: No results for input(s): HGBA1C in the last 72 hours. CBG: Recent Labs  Lab 03/27/19 2023 03/28/19 0635 03/28/19 1121 03/28/19 2007 03/29/19 0618  GLUCAP 135* 91 85 114* 81   Lipid Profile: No results for input(s): CHOL, HDL, LDLCALC, TRIG, CHOLHDL, LDLDIRECT in the last 72 hours. Thyroid Function Tests: No results for input(s): TSH, T4TOTAL, FREET4, T3FREE, THYROIDAB in the last 72 hours. Anemia Panel: No results for input(s): VITAMINB12, FOLATE, FERRITIN, TIBC, IRON, RETICCTPCT in the last 72 hours. Sepsis Labs: No results for input(s): PROCALCITON, LATICACIDVEN in the last 168 hours.  No results found for this or any previous visit (from the past 240 hour(s)).       Radiology Studies: No results found.      Scheduled Meds: . amLODipine  10 mg Oral Daily  . Chlorhexidine Gluconate Cloth  6 each Topical Q0600  . Chlorhexidine  Gluconate Cloth  6 each Topical [id number]  . cyclophosphamide  125 mg Oral Daily  . darbepoetin (ARANESP) injection - NON-DIALYSIS  100 mcg Subcutaneous Q Sun-1800  . heparin injection (subcutaneous)  5,000 Units Subcutaneous Q8H  . insulin aspart  0-9 Units Subcutaneous TID WC  . predniSONE  60 mg Oral Q breakfast  . sulfamethoxazole-trimethoprim  1 tablet Oral Daily   Continuous Infusions:   LOS: 18 days    Time spent: 25 mins.More than 50% of that time was spent in counseling and/or coordination of care.      Shelly Coss, MD [facility] P3/27/2021, 8:07 AM

## 2019-03-30 LAB — GLUCOSE, CAPILLARY
Glucose-Capillary: 78 mg/dL (ref 70–99)
Glucose-Capillary: 85 mg/dL (ref 70–99)
Glucose-Capillary: 115 mg/dL — ABNORMAL HIGH (ref 70–99)
Glucose-Capillary: 122 mg/dL — ABNORMAL HIGH (ref 70–99)

## 2019-03-30 LAB — RENAL FUNCTION PANEL
Albumin: 2.6 g/dL — ABNORMAL LOW (ref 3.5–5.0)
Anion gap: 13 (ref 5–15)
BUN: 55 mg/dL — ABNORMAL HIGH (ref 6–20)
CO2: 23 mmol/L (ref 22–32)
Calcium: 8.1 mg/dL — ABNORMAL LOW (ref 8.9–10.3)
Chloride: 103 mmol/L (ref 98–111)
Creatinine, Ser: 5.8 mg/dL — ABNORMAL HIGH (ref 0.61–1.24)
GFR calc Af Amer: 11 mL/min — ABNORMAL LOW (ref >60)
GFR calc non Af Amer: 10 mL/min — ABNORMAL LOW (ref >60)
Glucose, Bld: 114 mg/dL — ABNORMAL HIGH (ref 70–99)
Phosphorus: 6.3 mg/dL — ABNORMAL HIGH (ref 2.5–4.6)
Potassium: 4.6 mmol/L (ref 3.5–5.1)
Sodium: 139 mmol/L (ref 135–145)

## 2019-03-30 MED ORDER — TAMSULOSIN HCL 0.4 MG PO CAPS
0.4000 mg | ORAL_CAPSULE | Freq: Every day | ORAL | Status: DC
  Administered 2019-03-30 – 2019-04-04 (×6): 0.4 mg via ORAL
  Filled 2019-03-30 (×6): qty 1

## 2019-03-30 NOTE — Progress Notes (Signed)
PROGRESS NOTE    Gabriel Harris  STM:[id number] DOB: 1960/03/20 DOA: 03/10/2019 PCP: Patient, No Pcp Per   Brief Narrative: Patient is a 59 year old male with history of tobacco abuse who presented to the emergency department with 2 weeks history of worsening dizziness, nausea, tinnitus, diplopia, diarrhea, vomiting.  On presentation, MRI was negative for any acute intracranial findings but he was found to have severe AKI with creatinine greater than 10.  Started on IV fluids, nephrology consulted.  Started on hemodialysis.  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 He has been started  immunosuppressants.  There was no evidence of renal recovery, so he  will probably be on dialysis on permanent basis.  Process started for outpatient dialysis unit placement.  Assessment & Plan:   Principal Problem:   AKI (acute kidney injury) ([place]) Active Problems:   Hyperkalemia   Normocytic anemia   Hypoalbuminemia   Sinus bradycardia   Tobacco use   Vertigo   Diplopia   Severe AKI  versus progressive CKD: Had hematuria on presentation.   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 He has been started  immunosuppressants.  There was no evidence of renal recovery, so he  will probably be on dialysis on permanent basis.  Process started for outpatient dialysis unit placement.  Received bolus steroids for 3 days, now on prednisone 60 mg daily, Cytoxan 125 mg daily. Started on bactrim.He is  Nonoliguric.  Plan for next dialysis tomorrow.  Hyperkalemia: Resolved with Lokelma and with dialysis  Hypertension: Continue amlodipine and  hydralazine  Vertigo/diplopia: Overall improved.  MRI did not show any acute findings.  Chronic normocytic anemia:  Associated with CKD.  Continue ESA.  Currently CBC stable  Leukocytosis: Suspected to be reactive,associated with steroids. Continue to monitor  Sinus bradycardia: Stable  Tobacco use:  Cessation advised.  He says he smokes 2 to 3 packs of cigarettes a day.  Low albumin : Likely in the setting of nephrotic syndrome/CKD.         DVT prophylaxis: SCD, compression stockings Code Status: Full Family Communication: None present at the bedside.  Plan discussed with the patient Disposition Plan: Patient is from home. Anticipate discharge plan  after nephrology clearance, needs  outpatient dialysis center . Case manager  also assisting with his immunosuppressants, medications cost  Consultants: Nephrology  Procedures: Dialysis catheter placement  Antimicrobials:  Anti-infectives (From admission, onward)   Start     Dose/Rate Route Frequency Ordered Stop   03/12/19 1536  ceFAZolin (ANCEF) 2-4 GM/100ML-% IVPB    Note to Pharmacy: Rodney Booze   : cabinet override      03/12/19 1536 03/12/19 1606   03/12/19 1300  sulfamethoxazole-trimethoprim (BACTRIM) 400-80 MG per tablet 1 tablet     1 tablet Oral Daily 03/12/19 1259     03/12/19 0800  ceFAZolin (ANCEF) IVPB 2g/100 mL premix     2 g 200 mL/hr over 30 Minutes Intravenous  Once 03/11/19 1615 03/12/19 1610      Subjective: Patient seen and examined at the bedside this morning.  Hemodynamically stable.  Complains of dribbling of urine.  Started on tamsulosin.  Objective: Vitals:   03/29/19 1725 03/29/19 2142 03/30/19 0554 03/30/19 0848  BP: (!) 148/87 (!) 149/97 (!) 167/92 134/76  Pulse: 69 75 63 77  Resp: 16 16 20 18   Temp: 97.6 F (36.4 C) 97.7 F (36.5 C) 98.5 F (36.9 C) 98.6 F (37 C)  TempSrc: Oral Oral Oral Oral  SpO2: 97% 97% 96% 95%  Weight:      Height:        Intake/Output Summary (Last 24 hours) at 03/30/2019 1144 Last data filed at 03/30/2019 0900 Gross per 24 hour  Intake 1120 ml  Output 675 ml  Net 445 ml   Filed Weights   03/28/19 1630 03/28/19 1940 03/29/19 0103  Weight: 72.3 kg 72.3 kg 72.3 kg    Examination:  General exam: Comfortable Respiratory system: Bilateral equal air  entry, normal vesicular breath sounds, no wheezes or crackles  Cardiovascular system: S1 & S2 heard, RRR. No JVD, murmurs, rubs, gallops or clicks.  Temporary dialysis catheter on the right chest Gastrointestinal system: Abdomen is nondistended, soft and nontender. No organomegaly or masses felt. Normal bowel sounds heard. Central nervous system: Alert and oriented. No focal neurological deficits. Extremities: No edema, no clubbing ,no cyanosis Skin: No rashes, lesions or ulcers,no icterus ,no pallor    Data Reviewed: I have personally reviewed following labs and imaging studies  CBC: Recent Labs  Lab 03/24/19 1822 03/25/19 0744 03/29/19 0402  WBC 15.0* 13.9* 10.6*  NEUTROABS  --  12.9* 9.0*  HGB 9.0* 9.2* 9.7*  HCT 27.4* 28.2* 30.2*  MCV 91.3 91.6 94.7  PLT 178 183 007   Basic Metabolic Panel: Recent Labs  Lab 03/26/19 0849 03/27/19 0813 03/28/19 0519 03/29/19 0402 03/30/19 0418  NA 141 139 142 142 139  K 4.1 4.1 4.8 4.5 4.6  CL 106 105 106 103 103  CO2 22 21* 23 27 23   GLUCOSE 108* 135* 121* 79 114*  BUN 76* 84* 90* 43* 55*  CREATININE 6.24* 6.46* 7.10* 4.66* 5.80*  CALCIUM 8.3* 8.2* 8.6* 8.3* 8.1*  PHOS 5.6* 5.5* 6.1* 5.8* 6.3*   GFR: Estimated Creatinine Clearance: 12.1 mL/min (A) (by C-G formula based on SCr of 5.8 mg/dL (H)). Liver Function Tests: Recent Labs  Lab 03/26/19 0849 03/27/19 0813 03/28/19 0519 03/29/19 0402 03/30/19 0418  ALBUMIN 2.7* 2.8* 2.7* 2.8* 2.6*   No results for input(s): LIPASE, AMYLASE in the last 168 hours. No results for input(s): AMMONIA in the last 168 hours. Coagulation Profile: No results for input(s): INR, PROTIME in the last 168 hours. Cardiac Enzymes: No results for input(s): CKTOTAL, CKMB, CKMBINDEX, TROPONINI in the last 168 hours. BNP (last 3 results) No results for input(s): PROBNP in the last 8760 hours. HbA1C: No results for input(s): HGBA1C in the last 72 hours. CBG: Recent Labs  Lab 03/29/19 1146  03/29/19 1718 03/29/19 2141 03/30/19 0708 03/30/19 1127  GLUCAP 94 165* 189* 78 85   Lipid Profile: No results for input(s): CHOL, HDL, LDLCALC, TRIG, CHOLHDL, LDLDIRECT in the last 72 hours. Thyroid Function Tests: No results for input(s): TSH, T4TOTAL, FREET4, T3FREE, THYROIDAB in the last 72 hours. Anemia Panel: No results for input(s): VITAMINB12, FOLATE, FERRITIN, TIBC, IRON, RETICCTPCT in the last 72 hours. Sepsis Labs: No results for input(s): PROCALCITON, LATICACIDVEN in the last 168 hours.  No results found for this or any previous visit (from the past 240 hour(s)).       Radiology Studies: No results found.      Scheduled Meds: . amLODipine  10 mg Oral Daily  . Chlorhexidine Gluconate Cloth  6 each Topical Q0600  . Chlorhexidine Gluconate Cloth  6 each Topical Q0600  . cyclophosphamide  125 mg Oral Daily  . darbepoetin (ARANESP) injection - NON-DIALYSIS  100 mcg Subcutaneous Q Sun-1800  . heparin injection (subcutaneous)  5,000 Units Subcutaneous Q8H  . hydrALAZINE  25 mg Oral Q8H  . insulin aspart  0-9 Units Subcutaneous TID WC  . predniSONE  60 mg Oral Q breakfast  . sulfamethoxazole-trimethoprim  1 tablet Oral Daily  . tamsulosin  0.4 mg Oral Daily   Continuous Infusions:   LOS: 19 days    Time spent: 25 mins.More than 50% of that time was spent in counseling and/or coordination of care.      Shelly Coss, MD [facility] P3/28/2021, 11:44 AM

## 2019-03-30 NOTE — Progress Notes (Signed)
Patient ID: Gabriel Harris, male   DOB: [birth year], 59 y.o.   MRN: [id number] [facility] Progress Note   Assessment/ Plan:   1. Acute kidney Injury: Nonoliguric.  Unknown renal baseline.  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  In spite of his elevated creatinine, he had limited scarring indicating salvageable renal tissue for which she was started on cyclophosphamide and corticosteroids. Process started for outpatient dialysis unit placement along with attempt to procure cyclophosphamide.  Another question may be as to whether outpatient rituximab infusions may be another option (especially with his current social situation).  Continue to monitor daily labs for functional renal recovery that may allow discharge off of dialysis.  Will order dialysis again for tomorrow. 2.  Hypertension: Blood pressure with intermittent elevations on amlodipine, continue to monitor with HD. 3.  Anemia: Iron stores are repleted.  He is on Aranesp. 4.  Hyperphosphatemia: Secondary to acute kidney injury and impaired phosphorus handling, monitor with HD.  Subjective:   Frustrated with length of stay in the hospital and inquires about going home.  States that he feels well.   Objective:   BP 134/76 (BP Location: Right Arm)   Pulse 77   Temp 98.6 F (37 C) (Oral)   Resp 18   Ht 5\' 2"  (1.575 m)   Wt 72.3 kg   SpO2 95%   BMI 29.15 kg/m   Intake/Output Summary (Last 24 hours) at 03/30/2019 1104 Last data filed at 03/30/2019 0900 Gross per 24 hour  Intake 1120 ml  Output 675 ml  Net 445 ml   Weight change:   Physical Exam: Gen: Comfortably resting in bed, watching television CVS: Pulse regular rhythm, normal rate, S1 and S2 normal Resp: Clear to auscultation, no rales/rhonchi.  Right IJ TDC. Abd: Soft, obese, nontender Ext: No lower extremity edema  Imaging: No  results found.  Labs: BMET Recent Labs  Lab 03/24/19 0242 03/26/19 0849 03/27/19 0813 03/28/19 0519 03/29/19 0402 03/30/19 0418  NA 142 141 139 142 142 139  K 4.8 4.1 4.1 4.8 4.5 4.6  CL 108 106 105 106 103 103  CO2 21* 22 21* 23 27 23   GLUCOSE 102* 108* 135* 121* 79 114*  BUN 101* 76* 84* 90* 43* 55*  CREATININE 7.54* 6.24* 6.46* 7.10* 4.66* 5.80*  CALCIUM 8.2* 8.3* 8.2* 8.6* 8.3* 8.1*  PHOS 6.3* 5.6* 5.5* 6.1* 5.8* 6.3*   CBC Recent Labs  Lab 03/24/19 1822 03/25/19 0744 03/29/19 0402  WBC 15.0* 13.9* 10.6*  NEUTROABS  --  12.9* 9.0*  HGB 9.0* 9.2* 9.7*  HCT 27.4* 28.2* 30.2*  MCV 91.3 91.6 94.7  PLT 178 183 180    Medications:    . amLODipine  10 mg Oral Daily  . Chlorhexidine Gluconate Cloth  6 each Topical Q0600  . Chlorhexidine Gluconate Cloth  6 each Topical Q0600  . cyclophosphamide  125 mg Oral Daily  . darbepoetin (ARANESP) injection - NON-DIALYSIS  100 mcg Subcutaneous Q Sun-1800  . heparin injection (subcutaneous)  5,000 Units Subcutaneous Q8H  . hydrALAZINE  25 mg Oral Q8H  . insulin aspart  0-9 Units Subcutaneous TID WC  . predniSONE  60 mg Oral Q breakfast  . sulfamethoxazole-trimethoprim  1 tablet Oral Daily  . tamsulosin  0.4 mg Oral Daily   Elmarie Shiley, MD 03/30/2019, 11:04 AM

## 2019-03-31 ENCOUNTER — Encounter (HOSPITAL_COMMUNITY): Payer: Self-pay | Admitting: Nephrology

## 2019-03-31 LAB — RENAL FUNCTION PANEL
Albumin: 2.6 g/dL — ABNORMAL LOW (ref 3.5–5.0)
Anion gap: 14 (ref 5–15)
BUN: 64 mg/dL — ABNORMAL HIGH (ref 6–20)
CO2: 21 mmol/L — ABNORMAL LOW (ref 22–32)
Calcium: 8 mg/dL — ABNORMAL LOW (ref 8.9–10.3)
Chloride: 105 mmol/L (ref 98–111)
Creatinine, Ser: 6.62 mg/dL — ABNORMAL HIGH (ref 0.61–1.24)
GFR calc Af Amer: 10 mL/min — ABNORMAL LOW (ref >60)
GFR calc non Af Amer: 8 mL/min — ABNORMAL LOW (ref >60)
Glucose, Bld: 131 mg/dL — ABNORMAL HIGH (ref 70–99)
Phosphorus: 6.1 mg/dL — ABNORMAL HIGH (ref 2.5–4.6)
Potassium: 4.9 mmol/L (ref 3.5–5.1)
Sodium: 140 mmol/L (ref 135–145)

## 2019-03-31 LAB — SURGICAL PATHOLOGY

## 2019-03-31 LAB — GLUCOSE, CAPILLARY
Glucose-Capillary: 94 mg/dL (ref 70–99)
Glucose-Capillary: 87 mg/dL (ref 70–99)
Glucose-Capillary: 110 mg/dL — ABNORMAL HIGH (ref 70–99)
Glucose-Capillary: 100 mg/dL — ABNORMAL HIGH (ref 70–99)

## 2019-03-31 MED ORDER — HEPARIN SODIUM (PORCINE) 1000 UNIT/ML IJ SOLN
INTRAMUSCULAR | Status: AC
  Filled 2019-03-31: qty 4

## 2019-03-31 NOTE — Progress Notes (Signed)
[facility] ROUNDING NOTE   Subjective:   This is a 14-year gentleman history tobacco abuse presented with some neurological symptoms.  MRI was negative for acute intracranial findings was found acute kidney injury with a creatinine greater than 10.  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  Patient has received 3 pulse doses of steroids and is continuing on Cytoxan 125 mg daily.  Dialysis is being planned for 03/31/2019.  Urine output 665 cc 03/30/2019  Blood pressure 138/80 pulse 72 temperature 98 O2 sats 96% room air  Sodium 140 potassium 4.9 chloride 105 CO2 21 BUN 64 creatinine 6.62 glucose 131 hemoglobin 9.7  Amlodipine 10 mg daily cyclophosphamide 125 mg daily darbepoetin 100 mcg every Sunday hydralazine 25 mg every 8 hours insulin sliding scale, prednisone 60 mg daily Bactrim 1 tablet daily Flomax 0.4 mg daily   Objective:  Vital signs in last 24 hours:  Temp:  [98 F (36.7 C)-98.8 F (37.1 C)] 98 F (36.7 C) (03/29 0945) Pulse Rate:  [64-72] 72 (03/29 0945) Resp:  [14-18] 18 (03/29 0945) BP: (138-169)/(80-92) 138/80 (03/29 0945) SpO2:  [95 %-98 %] 96 % (03/29 0945) Weight:  [71.2 kg] 71.2 kg (03/29 0109)  Weight change:  Filed Weights   03/29/19 0103 03/30/19 2111 03/31/19 0109  Weight: 72.3 kg 71.2 kg 71.2 kg    Intake/Output: I/O last 3 completed shifts: In: 1800 [P.O.:1800] Out: 1015 [Urine:1015]   Intake/Output this shift:  No intake/output data recorded.  Gen: Comfortably resting in bed, watching television CVS: Pulse regular rhythm, normal rate, S1 and S2 normal Resp: Clear to auscultation, no rales/rhonchi.  Right IJ TDC. Abd: Soft, obese, nontender Ext: No lower extremity edema   Basic Metabolic Panel: Recent Labs  Lab 03/27/19 0813 03/27/19 0813 03/28/19 0519 03/28/19 0519 03/29/19 0402 03/30/19 0418 03/31/19 0530  NA 139  --  142  --  142 139 140  K 4.1  --  4.8  --  4.5 4.6 4.9  CL 105  --  106  --  103 103  105  CO2 21*  --  23  --  27 23 21*  GLUCOSE 135*  --  121*  --  79 114* 131*  BUN 84*  --  90*  --  43* 55* 64*  CREATININE 6.46*  --  7.10*  --  4.66* 5.80* 6.62*  CALCIUM 8.2*   < > 8.6*   < > 8.3* 8.1* 8.0*  PHOS 5.5*  --  6.1*  --  5.8* 6.3* 6.1*   < > = values in this interval not displayed.    Liver Function Tests: Recent Labs  Lab 03/27/19 0813 03/28/19 0519 03/29/19 0402 03/30/19 0418 03/31/19 0530  ALBUMIN 2.8* 2.7* 2.8* 2.6* 2.6*   No results for input(s): LIPASE, AMYLASE in the last 168 hours. No results for input(s): AMMONIA in the last 168 hours.  CBC: Recent Labs  Lab 03/24/19 1822 03/25/19 0744 03/29/19 0402  WBC 15.0* 13.9* 10.6*  NEUTROABS  --  12.9* 9.0*  HGB 9.0* 9.2* 9.7*  HCT 27.4* 28.2* 30.2*  MCV 91.3 91.6 94.7  PLT 178 183 180    Cardiac Enzymes: No results for input(s): CKTOTAL, CKMB, CKMBINDEX, TROPONINI in the last 168 hours.  BNP: Invalid input(s): POCBNP  CBG: Recent Labs  Lab 03/30/19 0708 03/30/19 1127 03/30/19 1627 03/30/19 2108 03/31/19 0654  GLUCAP 78 85 115* 122* 16    Microbiology: Results for orders placed or performed during the hospital encounter of 03/10/19  SARS CORONAVIRUS 2 (TAT 6-24 HRS) Nasopharyngeal Nasopharyngeal Swab     Status: None   Collection Time: 03/10/19  9:42 AM   Specimen: Nasopharyngeal Swab  Result Value Ref Range Status   SARS Coronavirus 2 NEGATIVE NEGATIVE Final    Comment: (NOTE) SARS-CoV-2 target nucleic acids are NOT DETECTED. The SARS-CoV-2 RNA is generally detectable in upper and lower respiratory specimens during the acute phase of infection. Negative results do not preclude SARS-CoV-2 infection, do not rule out co-infections with other pathogens, and should not be used as the sole basis for treatment or other patient management decisions. Negative results must be combined with clinical observations, patient history, and epidemiological information. The expected result is  Negative. Fact Sheet for Patients: SugarRoll.be Fact Sheet for Healthcare Providers: https://www.woods-mathews.com/ This test is not yet approved or cleared by the [place] FDA and  has been authorized for detection and/or diagnosis of SARS-CoV-2 by FDA under an Emergency Use Authorization (EUA). This EUA will remain  in effect (meaning this test can be used) for the duration of the COVID-19 declaration under Section 56 4(b)(1) of the Act, 21 U.S.C. section 360bbb-3(b)(1), unless the authorization is terminated or revoked sooner. Performed at [facility], [place] [address] [place], [place] [address]     Coagulation Studies: No results for input(s): LABPROT, INR in the last 72 hours.  Urinalysis: No results for input(s): COLORURINE, LABSPEC, PHURINE, GLUCOSEU, HGBUR, BILIRUBINUR, KETONESUR, PROTEINUR, UROBILINOGEN, NITRITE, LEUKOCYTESUR in the last 72 hours.  Invalid input(s): APPERANCEUR    Imaging: No results found.   Medications:    . amLODipine  10 mg Oral Daily  . Chlorhexidine Gluconate Cloth  6 each Topical Q0600  . Chlorhexidine Gluconate Cloth  6 each Topical Q0600  . cyclophosphamide  125 mg Oral Daily  . darbepoetin (ARANESP) injection - NON-DIALYSIS  100 mcg Subcutaneous Q Sun-1800  . heparin injection (subcutaneous)  5,000 Units Subcutaneous Q8H  . hydrALAZINE  25 mg Oral Q8H  . insulin aspart  0-9 Units Subcutaneous TID WC  . predniSONE  60 mg Oral Q breakfast  . sulfamethoxazole-trimethoprim  1 tablet Oral Daily  . tamsulosin  0.4 mg Oral Daily   acetaminophen **OR** acetaminophen, ondansetron **OR** ondansetron (ZOFRAN) IV, zolpidem  Assessment/ Plan:  1. Acute kidney Injury: Nonoliguric.  Unknown renal baseline.  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  In spite of his  elevated creatinine, he had limited scarring indicating salvageable renal tissue for which she was started on cyclophosphamide and corticosteroids. Process started for outpatient dialysis unit placement along with attempt to procure cyclophosphamide.  Another question may be as to whether outpatient rituximab infusions may be another option (especially with his current social situation).  Unclear if patient is going to recover at this point.  It seems unlikely as the patient is dialysis dependent. Continue to monitor daily labs for functional renal recovery that may allow discharge off of dialysis.    Dialysis planned 03/31/2019. 2.  Hypertension: Blood pressure with intermittent elevations on amlodipine, continue to monitor with HD. 3.  Anemia: Iron stores are repleted.  He is on Aranesp. 4.  Hyperphosphatemia: Secondary to acute kidney injury and impaired phosphorus handling, monitor with HD. 5.  ANCA vasculitis MPO.  Dialysis dependent.-I doubt patient is going to get meaningful renal recovery.  Would recommend rapid taper of prednisone over 4 weeks and discontinuation of immunosuppressants if no recovery  in 4 weeks.   LOS: Orin @TODAY @10 :09 AM

## 2019-03-31 NOTE — Progress Notes (Signed)
PROGRESS NOTE    Gabriel Harris  HWE:[id number] DOB: [birth year] DOA: 03/10/2019 PCP: Patient, No Pcp Per   Brief Narrative: Patient is a 59 year old male with history of tobacco abuse who presented to the emergency department with 2 weeks history of worsening dizziness, nausea, tinnitus, diplopia, diarrhea, vomiting.  On presentation, MRI was negative for any acute intracranial findings but he was found to have severe AKI with creatinine greater than 10.  Started on IV fluids, nephrology consulted.  Started on hemodialysis.  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 He has been started  immunosuppressants.  There was no evidence of renal recovery, so he  will probably be on dialysis on permanent basis.  Process started for outpatient dialysis unit placement.  Assessment & Plan:   Principal Problem:   AKI (acute kidney injury) ([place]) Active Problems:   Hyperkalemia   Normocytic anemia   Hypoalbuminemia   Sinus bradycardia   Tobacco use   Vertigo   Diplopia   Severe AKI  versus progressive CKD: Had hematuria on presentation.   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 He has been started  immunosuppressants.  There was no evidence of renal recovery, so he  will probably be on dialysis on permanent basis.  Process started for outpatient dialysis unit placement.  Received bolus steroids for 3 days, now on prednisone 60 mg daily, Cytoxan 125 mg daily. Started on bactrim.He is  Nonoliguric.  Plan for dialysis today.  Hyperkalemia: Resolved with Lokelma and with dialysis  Hypertension: Continue amlodipine and  hydralazine  Vertigo/diplopia: Overall improved.  MRI did not show any acute findings.  Chronic normocytic anemia:  Associated with CKD.  Continue ESA.  Currently CBC stable  Leukocytosis: Suspected to be reactive,associated with steroids. Continue to monitor  Sinus bradycardia: Stable  Tobacco use: Cessation  advised.  He says he smokes 2 to 3 packs of cigarettes a day.  Low albumin : Likely in the setting of nephrotic syndrome/CKD.         DVT prophylaxis: SCD, compression stockings Code Status: Full Family Communication: None present at the bedside.  Plan discussed with the patient Disposition Plan: Patient is from home. Anticipate discharge plan  after nephrology clearance, needs  outpatient dialysis center . Case manager  also assisting with his immunosuppressants, medications cost  Consultants: Nephrology  Procedures: Dialysis catheter placement  Antimicrobials:  Anti-infectives (From admission, onward)   Start     Dose/Rate Route Frequency Ordered Stop   03/12/19 1536  ceFAZolin (ANCEF) 2-4 GM/100ML-% IVPB    Note to Pharmacy: Rodney Booze   : cabinet override      03/12/19 1536 03/12/19 1606   03/12/19 1300  sulfamethoxazole-trimethoprim (BACTRIM) 400-80 MG per tablet 1 tablet     1 tablet Oral Daily 03/12/19 1259     03/12/19 0800  ceFAZolin (ANCEF) IVPB 2g/100 mL premix     2 g 200 mL/hr over 30 Minutes Intravenous  Once 03/11/19 1615 03/12/19 1610      Subjective: Patient seen and examined at the bedside this morning.  Hemodynamically stable.  Sleeping and very frustrated being not able to go home.  Angry.  Objective: Vitals:   03/30/19 2111 03/31/19 0109 03/31/19 0541 03/31/19 0945  BP: (!) 152/91  (!) 169/92 138/80  Pulse: 65  64 72  Resp: 14  16 18   Temp: 98.6 F (37 C)  98.1 F (36.7 C) 98 F (36.7 C)  TempSrc:  Oral  Oral Oral  SpO2: 96%  98% 96%  Weight: 71.2 kg 71.2 kg    Height:        Intake/Output Summary (Last 24 hours) at 03/31/2019 1306 Last data filed at 03/31/2019 0900 Gross per 24 hour  Intake 840 ml  Output 990 ml  Net -150 ml   Filed Weights   03/29/19 0103 03/30/19 2111 03/31/19 0109  Weight: 72.3 kg 71.2 kg 71.2 kg    Examination:  General exam: Looks Comfortable Denied examination     Data Reviewed: I have personally  reviewed following labs and imaging studies  CBC: Recent Labs  Lab 03/24/19 1822 03/25/19 0744 03/29/19 0402  WBC 15.0* 13.9* 10.6*  NEUTROABS  --  12.9* 9.0*  HGB 9.0* 9.2* 9.7*  HCT 27.4* 28.2* 30.2*  MCV 91.3 91.6 94.7  PLT 178 183 017   Basic Metabolic Panel: Recent Labs  Lab 03/27/19 0813 03/28/19 0519 03/29/19 0402 03/30/19 0418 03/31/19 0530  NA 139 142 142 139 140  K 4.1 4.8 4.5 4.6 4.9  CL 105 106 103 103 105  CO2 21* 23 27 23  21*  GLUCOSE 135* 121* 79 114* 131*  BUN 84* 90* 43* 55* 64*  CREATININE 6.46* 7.10* 4.66* 5.80* 6.62*  CALCIUM 8.2* 8.6* 8.3* 8.1* 8.0*  PHOS 5.5* 6.1* 5.8* 6.3* 6.1*   GFR: Estimated Creatinine Clearance: 10.5 mL/min (A) (by C-G formula based on SCr of 6.62 mg/dL (H)). Liver Function Tests: Recent Labs  Lab 03/27/19 0813 03/28/19 0519 03/29/19 0402 03/30/19 0418 03/31/19 0530  ALBUMIN 2.8* 2.7* 2.8* 2.6* 2.6*   No results for input(s): LIPASE, AMYLASE in the last 168 hours. No results for input(s): AMMONIA in the last 168 hours. Coagulation Profile: No results for input(s): INR, PROTIME in the last 168 hours. Cardiac Enzymes: No results for input(s): CKTOTAL, CKMB, CKMBINDEX, TROPONINI in the last 168 hours. BNP (last 3 results) No results for input(s): PROBNP in the last 8760 hours. HbA1C: No results for input(s): HGBA1C in the last 72 hours. CBG: Recent Labs  Lab 03/30/19 1127 03/30/19 1627 03/30/19 2108 03/31/19 0654 03/31/19 1129  GLUCAP 85 115* 122* 94 87   Lipid Profile: No results for input(s): CHOL, HDL, LDLCALC, TRIG, CHOLHDL, LDLDIRECT in the last 72 hours. Thyroid Function Tests: No results for input(s): TSH, T4TOTAL, FREET4, T3FREE, THYROIDAB in the last 72 hours. Anemia Panel: No results for input(s): VITAMINB12, FOLATE, FERRITIN, TIBC, IRON, RETICCTPCT in the last 72 hours. Sepsis Labs: No results for input(s): PROCALCITON, LATICACIDVEN in the last 168 hours.  No results found for this or any  previous visit (from the past 240 hour(s)).       Radiology Studies: No results found.      Scheduled Meds: . amLODipine  10 mg Oral Daily  . Chlorhexidine Gluconate Cloth  6 each Topical Q0600  . Chlorhexidine Gluconate Cloth  6 each Topical Q0600  . cyclophosphamide  125 mg Oral Daily  . darbepoetin (ARANESP) injection - NON-DIALYSIS  100 mcg Subcutaneous Q Sun-1800  . heparin injection (subcutaneous)  5,000 Units Subcutaneous Q8H  . hydrALAZINE  25 mg Oral Q8H  . insulin aspart  0-9 Units Subcutaneous TID WC  . predniSONE  60 mg Oral Q breakfast  . sulfamethoxazole-trimethoprim  1 tablet Oral Daily  . tamsulosin  0.4 mg Oral Daily   Continuous Infusions:   LOS: 20 days    Time spent: 25 mins.More than 50% of that time was spent in counseling and/or coordination of  care.      Shelly Coss, MD [facility] P3/29/2021, 1:06 PM

## 2019-04-01 LAB — RENAL FUNCTION PANEL
Albumin: 2.6 g/dL — ABNORMAL LOW (ref 3.5–5.0)
Anion gap: 12 (ref 5–15)
BUN: 31 mg/dL — ABNORMAL HIGH (ref 6–20)
CO2: 27 mmol/L (ref 22–32)
Calcium: 7.8 mg/dL — ABNORMAL LOW (ref 8.9–10.3)
Chloride: 101 mmol/L (ref 98–111)
Creatinine, Ser: 4.32 mg/dL — ABNORMAL HIGH (ref 0.61–1.24)
GFR calc Af Amer: 16 mL/min — ABNORMAL LOW (ref >60)
GFR calc non Af Amer: 14 mL/min — ABNORMAL LOW (ref >60)
Glucose, Bld: 88 mg/dL (ref 70–99)
Phosphorus: 5.7 mg/dL — ABNORMAL HIGH (ref 2.5–4.6)
Potassium: 4.4 mmol/L (ref 3.5–5.1)
Sodium: 140 mmol/L (ref 135–145)

## 2019-04-01 LAB — GLUCOSE, CAPILLARY
Glucose-Capillary: 76 mg/dL (ref 70–99)
Glucose-Capillary: 85 mg/dL (ref 70–99)
Glucose-Capillary: 132 mg/dL — ABNORMAL HIGH (ref 70–99)
Glucose-Capillary: 107 mg/dL — ABNORMAL HIGH (ref 70–99)

## 2019-04-01 NOTE — Plan of Care (Signed)
  Problem: Self-Concept: Goal: Body image disturbance will be avoided or minimized Outcome: Progressing

## 2019-04-01 NOTE — Progress Notes (Signed)
PROGRESS NOTE    Gabriel Harris  MVH:[id number] DOB: February 13, 1960 DOA: 03/10/2019 PCP: Patient, No Pcp Per   Brief Narrative: Patient is a 59 year old male with history of tobacco abuse who presented to the emergency department with 2 weeks history of worsening dizziness, nausea, tinnitus, diplopia, diarrhea, vomiting.  On presentation, MRI was negative for any acute intracranial findings but he was found to have severe AKI with creatinine greater than 10.  Started on IV fluids, nephrology consulted.  Started on hemodialysis.  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 He has been started  immunosuppressants.  There was no evidence of renal recovery, so he  will probably be on dialysis on permanent basis.  Process started for outpatient dialysis unit placement.  Waiting for nephrology clearance for discharge.  Assessment & Plan:   Principal Problem:   AKI (acute kidney injury) ([place]) Active Problems:   Hyperkalemia   Normocytic anemia   Hypoalbuminemia   Sinus bradycardia   Tobacco use   Vertigo   Diplopia   Severe AKI  versus progressive CKD: Had hematuria on presentation.   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 He has been started  immunosuppressants.  There was no evidence of renal recovery, so he  will probably be on dialysis on permanent basis.  Process started for outpatient dialysis unit placement.  Received bolus steroids for 3 days, now on prednisone 60 mg daily, Cytoxan 125 mg daily. Started on bactrim.He is  Nonoliguric.  Further management plan as per nephrology.  Hyperkalemia: Resolved with Lokelma and with dialysis  Hypertension: Continue amlodipine and  hydralazine  Vertigo/diplopia: Overall improved.  MRI did not show any acute findings.  Chronic normocytic anemia:  Associated with CKD.  Continue ESA.  Currently CBC stable  Leukocytosis: Suspected to be reactive,associated with steroids. Continue to  monitor  Sinus bradycardia: Stable  Tobacco use: Cessation advised.  He says he smokes 2 to 3 packs of cigarettes a day.  Low albumin : Likely in the setting of nephrotic syndrome/CKD.         DVT prophylaxis: SCD, compression stockings Code Status: Full Family Communication: None present at the bedside.  Plan discussed with the patient Disposition Plan: Patient is from home. Discharge plan to home after nephrology clearance, needs  outpatient dialysis center . Case manager  also assisting with his immunosuppressants, medications cost  Consultants: Nephrology  Procedures: Dialysis catheter placement  Antimicrobials:  Anti-infectives (From admission, onward)   Start     Dose/Rate Route Frequency Ordered Stop   03/12/19 1536  ceFAZolin (ANCEF) 2-4 GM/100ML-% IVPB    Note to Pharmacy: Rodney Booze   : cabinet override      03/12/19 1536 03/12/19 1606   03/12/19 1300  sulfamethoxazole-trimethoprim (BACTRIM) 400-80 MG per tablet 1 tablet     1 tablet Oral Daily 03/12/19 1259     03/12/19 0800  ceFAZolin (ANCEF) IVPB 2g/100 mL premix     2 g 200 mL/hr over 30 Minutes Intravenous  Once 03/11/19 1615 03/12/19 1610      Subjective: Patient seen and examined the bedside this morning.  Hemodynamically stable.  Lying on the bed.  As always, very unhappy about not being able to go home.  Objective: Vitals:   03/31/19 1641 03/31/19 1707 03/31/19 2145 04/01/19 0623  BP: (!) 142/80 (!) 161/91 (!) 154/89 (!) 152/87  Pulse: 71 72 68 68  Resp: 16 18 18 18   Temp: 98.3  F (36.8 C) 97.9 F (36.6 C) 98.4 F (36.9 C) 98.7 F (37.1 C)  TempSrc: Oral Oral Oral Oral  SpO2: 94% 98% 97% 98%  Weight: 71.9 kg     Height:        Intake/Output Summary (Last 24 hours) at 04/01/2019 0748 Last data filed at 03/31/2019 2200 Gross per 24 hour  Intake 960 ml  Output 600 ml  Net 360 ml   Filed Weights   03/31/19 0109 03/31/19 1302 03/31/19 1641  Weight: 71.2 kg 71.9 kg 71.9 kg     Examination:  General exam: Comfortble Respiratory system:no wheezes or crackles  Cardiovascular system: S1 & S2 heard, RRR. No JVD, murmurs, rubs, gallops or clicks.  Dialysis catheter on the right chest Gastrointestinal system: Abdomen is nondistended, soft and nontender.  Central nervous system: Alert and oriented.  Extremities: No edema, no clubbing ,no cyanosis Skin: No rashes, lesions or ulcers,no icterus ,no pallor    Data Reviewed: I have personally reviewed following labs and imaging studies  CBC: Recent Labs  Lab 03/29/19 0402  WBC 10.6*  NEUTROABS 9.0*  HGB 9.7*  HCT 30.2*  MCV 94.7  PLT 456   Basic Metabolic Panel: Recent Labs  Lab 03/27/19 0813 03/28/19 0519 03/29/19 0402 03/30/19 0418 03/31/19 0530  NA 139 142 142 139 140  K 4.1 4.8 4.5 4.6 4.9  CL 105 106 103 103 105  CO2 21* 23 27 23  21*  GLUCOSE 135* 121* 79 114* 131*  BUN 84* 90* 43* 55* 64*  CREATININE 6.46* 7.10* 4.66* 5.80* 6.62*  CALCIUM 8.2* 8.6* 8.3* 8.1* 8.0*  PHOS 5.5* 6.1* 5.8* 6.3* 6.1*   GFR: Estimated Creatinine Clearance: 10.6 mL/min (A) (by C-G formula based on SCr of 6.62 mg/dL (H)). Liver Function Tests: Recent Labs  Lab 03/27/19 0813 03/28/19 0519 03/29/19 0402 03/30/19 0418 03/31/19 0530  ALBUMIN 2.8* 2.7* 2.8* 2.6* 2.6*   No results for input(s): LIPASE, AMYLASE in the last 168 hours. No results for input(s): AMMONIA in the last 168 hours. Coagulation Profile: No results for input(s): INR, PROTIME in the last 168 hours. Cardiac Enzymes: No results for input(s): CKTOTAL, CKMB, CKMBINDEX, TROPONINI in the last 168 hours. BNP (last 3 results) No results for input(s): PROBNP in the last 8760 hours. HbA1C: No results for input(s): HGBA1C in the last 72 hours. CBG: Recent Labs  Lab 03/31/19 0654 03/31/19 1129 03/31/19 1707 03/31/19 2157 04/01/19 0638  GLUCAP 94 87 110* 100* 76   Lipid Profile: No results for input(s): CHOL, HDL, LDLCALC, TRIG, CHOLHDL,  LDLDIRECT in the last 72 hours. Thyroid Function Tests: No results for input(s): TSH, T4TOTAL, FREET4, T3FREE, THYROIDAB in the last 72 hours. Anemia Panel: No results for input(s): VITAMINB12, FOLATE, FERRITIN, TIBC, IRON, RETICCTPCT in the last 72 hours. Sepsis Labs: No results for input(s): PROCALCITON, LATICACIDVEN in the last 168 hours.  No results found for this or any previous visit (from the past 240 hour(s)).       Radiology Studies: No results found.      Scheduled Meds: . amLODipine  10 mg Oral Daily  . Chlorhexidine Gluconate Cloth  6 each Topical Q0600  . cyclophosphamide  125 mg Oral Daily  . darbepoetin (ARANESP) injection - NON-DIALYSIS  100 mcg Subcutaneous Q Sun-1800  . heparin injection (subcutaneous)  5,000 Units Subcutaneous Q8H  . hydrALAZINE  25 mg Oral Q8H  . insulin aspart  0-9 Units Subcutaneous TID WC  . predniSONE  60 mg Oral Q breakfast  .  sulfamethoxazole-trimethoprim  1 tablet Oral Daily  . tamsulosin  0.4 mg Oral Daily   Continuous Infusions:   LOS: 21 days    Time spent: 25 mins.More than 50% of that time was spent in counseling and/or coordination of care.      Shelly Coss, MD [facility] P3/30/2021, 7:48 AM

## 2019-04-01 NOTE — Progress Notes (Signed)
[facility] ROUNDING NOTE   Subjective:   This is a 64-year gentleman history tobacco abuse presented with some neurological symptoms.  MRI was negative for acute intracranial findings was found acute kidney injury with a creatinine greater than 10.  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  Good urine output being treated aggressively based on optimistic biopsy findings.  Will continue to follow renal function and will hold off on dialysis tomorrow 04/02/2019  Patient has received 3 pulse doses of steroids and is continuing on Cytoxan 125 mg daily.  Dialysis 03/31/2019 no fluid removed  Urine output 950 cc 03/31/2019  Blood pressure 132/75 pulse 72 temperature 98.4 O2 sats 94% room air  Sodium 140 potassium 4.4 chloride 101 CO2 27 BUN 31 creatinine 4.32 glucose 88 phosphorus 5.7 calcium 7.8 albumin 2.6.  Amlodipine 10 mg daily cyclophosphamide 125 mg daily darbepoetin 100 mcg every Sunday hydralazine 25 mg every 8 hours insulin sliding scale, prednisone 60 mg daily Bactrim 1 tablet daily Flomax 0.4 mg daily   Objective:  Vital signs in last 24 hours:  Temp:  [97.9 F (36.6 C)-98.7 F (37.1 C)] 98.4 F (36.9 C) (03/30 0856) Pulse Rate:  [68-90] 72 (03/30 0856) Resp:  [16-18] 18 (03/30 0856) BP: (124-161)/(64-91) 132/75 (03/30 0856) SpO2:  [94 %-98 %] 94 % (03/30 0856) Weight:  [71.9 kg] 71.9 kg (03/29 1641)  Weight change: 0.7 kg Filed Weights   03/31/19 0109 03/31/19 1302 03/31/19 1641  Weight: 71.2 kg 71.9 kg 71.9 kg    Intake/Output: I/O last 3 completed shifts: In: 1560 [P.O.:1560] Out: 950 [Urine:950]   Intake/Output this shift:  No intake/output data recorded.  Gen: Comfortably resting in bed, watching television CVS: Pulse regular rhythm, normal rate, S1 and S2 normal Resp: Clear to auscultation, no rales/rhonchi.  Right IJ TDC. Abd: Soft, obese, nontender Ext: No lower extremity edema   Basic Metabolic Panel: Recent Labs  Lab  03/28/19 0519 03/28/19 0519 03/29/19 0402 03/29/19 0402 03/30/19 0418 03/31/19 0530 04/01/19 0625  NA 142  --  142  --  139 140 140  K 4.8  --  4.5  --  4.6 4.9 4.4  CL 106  --  103  --  103 105 101  CO2 23  --  27  --  23 21* 27  GLUCOSE 121*  --  79  --  114* 131* 88  BUN 90*  --  43*  --  55* 64* 31*  CREATININE 7.10*  --  4.66*  --  5.80* 6.62* 4.32*  CALCIUM 8.6*   < > 8.3*   < > 8.1* 8.0* 7.8*  PHOS 6.1*  --  5.8*  --  6.3* 6.1* 5.7*   < > = values in this interval not displayed.    Liver Function Tests: Recent Labs  Lab 03/28/19 0519 03/29/19 0402 03/30/19 0418 03/31/19 0530 04/01/19 0625  ALBUMIN 2.7* 2.8* 2.6* 2.6* 2.6*   No results for input(s): LIPASE, AMYLASE in the last 168 hours. No results for input(s): AMMONIA in the last 168 hours.  CBC: Recent Labs  Lab 03/29/19 0402  WBC 10.6*  NEUTROABS 9.0*  HGB 9.7*  HCT 30.2*  MCV 94.7  PLT 180    Cardiac Enzymes: No results for input(s): CKTOTAL, CKMB, CKMBINDEX, TROPONINI in the last 168 hours.  BNP: Invalid input(s): POCBNP  CBG: Recent Labs  Lab 03/31/19 0654 03/31/19 1129 03/31/19 1707 03/31/19 2157 04/01/19 0638  GLUCAP 94 87 110* 100* 76  Microbiology: Results for orders placed or performed during the hospital encounter of 03/10/19  SARS CORONAVIRUS 2 (TAT 6-24 HRS) Nasopharyngeal Nasopharyngeal Swab     Status: None   Collection Time: 03/10/19  9:42 AM   Specimen: Nasopharyngeal Swab  Result Value Ref Range Status   SARS Coronavirus 2 NEGATIVE NEGATIVE Final    Comment: (NOTE) SARS-CoV-2 target nucleic acids are NOT DETECTED. The SARS-CoV-2 RNA is generally detectable in upper and lower respiratory specimens during the acute phase of infection. Negative results do not preclude SARS-CoV-2 infection, do not rule out co-infections with other pathogens, and should not be used as the sole basis for treatment or other patient management decisions. Negative results must be combined  with clinical observations, patient history, and epidemiological information. The expected result is Negative. Fact Sheet for Patients: SugarRoll.be Fact Sheet for Healthcare Providers: https://www.woods-mathews.com/ This test is not yet approved or cleared by the [place] FDA and  has been authorized for detection and/or diagnosis of SARS-CoV-2 by FDA under an Emergency Use Authorization (EUA). This EUA will remain  in effect (meaning this test can be used) for the duration of the COVID-19 declaration under Section 56 4(b)(1) of the Act, 21 U.S.C. section 360bbb-3(b)(1), unless the authorization is terminated or revoked sooner. Performed at [facility], [place] [address], [place], [place] [address]     Coagulation Studies: No results for input(s): LABPROT, INR in the last 72 hours.  Urinalysis: No results for input(s): COLORURINE, LABSPEC, PHURINE, GLUCOSEU, HGBUR, BILIRUBINUR, KETONESUR, PROTEINUR, UROBILINOGEN, NITRITE, LEUKOCYTESUR in the last 72 hours.  Invalid input(s): APPERANCEUR    Imaging: No results found.   Medications:    . amLODipine  10 mg Oral Daily  . Chlorhexidine Gluconate Cloth  6 each Topical Q0600  . cyclophosphamide  125 mg Oral Daily  . darbepoetin (ARANESP) injection - NON-DIALYSIS  100 mcg Subcutaneous Q Sun-1800  . heparin injection (subcutaneous)  5,000 Units Subcutaneous Q8H  . hydrALAZINE  25 mg Oral Q8H  . insulin aspart  0-9 Units Subcutaneous TID WC  . predniSONE  60 mg Oral Q breakfast  . sulfamethoxazole-trimethoprim  1 tablet Oral Daily  . tamsulosin  0.4 mg Oral Daily   acetaminophen **OR** acetaminophen, ondansetron **OR** ondansetron (ZOFRAN) IV, zolpidem  Assessment/ Plan:  1. Acute kidney Injury: Nonoliguric.  Unknown renal baseline.  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  In spite of his elevated creatinine, he had limited scarring indicating salvageable renal tissue for which she was started on cyclophosphamide and corticosteroids. Process started for outpatient dialysis unit placement along with attempt to procure cyclophosphamide.  Another question may be as to whether outpatient rituximab infusions may be another option (especially with his current social situation).  Unclear if patient is going to recover at this point.   We will continue to follow renal panel. 2.  Hypertension: Blood pressure with intermittent elevations on amlodipine, continue to monitor with HD. 3.  Anemia: Iron stores are repleted.  He is on Aranesp. 4.  Hyperphosphatemia: Secondary to acute kidney injury and impaired phosphorus handling, monitor with HD. 5.  ANCA vasculitis MPO.  Dialysis dependent.-I doubt patient is going to get meaningful renal recovery.  Would recommend rapid taper of prednisone over 4 weeks and discontinuation of immunosuppressants if no recovery in 4 weeks.   LOS: [place] @TODAY @9 :09 AM

## 2019-04-02 LAB — RENAL FUNCTION PANEL
Albumin: 2.8 g/dL — ABNORMAL LOW (ref 3.5–5.0)
Anion gap: 11 (ref 5–15)
BUN: 45 mg/dL — ABNORMAL HIGH (ref 6–20)
CO2: 23 mmol/L (ref 22–32)
Calcium: 8.1 mg/dL — ABNORMAL LOW (ref 8.9–10.3)
Chloride: 103 mmol/L (ref 98–111)
Creatinine, Ser: 5.46 mg/dL — ABNORMAL HIGH (ref 0.61–1.24)
GFR calc Af Amer: 12 mL/min — ABNORMAL LOW (ref >60)
GFR calc non Af Amer: 11 mL/min — ABNORMAL LOW (ref >60)
Glucose, Bld: 80 mg/dL (ref 70–99)
Phosphorus: 5.9 mg/dL — ABNORMAL HIGH (ref 2.5–4.6)
Potassium: 4.6 mmol/L (ref 3.5–5.1)
Sodium: 137 mmol/L (ref 135–145)

## 2019-04-02 LAB — GLUCOSE, CAPILLARY
Glucose-Capillary: 136 mg/dL — ABNORMAL HIGH (ref 70–99)
Glucose-Capillary: 101 mg/dL — ABNORMAL HIGH (ref 70–99)
Glucose-Capillary: 164 mg/dL — ABNORMAL HIGH (ref 70–99)
Glucose-Capillary: 145 mg/dL — ABNORMAL HIGH (ref 70–99)

## 2019-04-02 NOTE — Progress Notes (Signed)
[facility] ROUNDING NOTE   Subjective:   This is a 72-year gentleman history tobacco abuse presented with some neurological symptoms.  MRI was negative for acute intracranial findings was found acute kidney injury with a creatinine greater than 10.  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  Good urine output being treated aggressively based on optimistic biopsy findings.  Will continue to follow renal function and will hold off on dialysis tomorrow 04/02/2019  Patient has received 3 pulse doses of steroids and is continuing on Cytoxan 125 mg daily.  Dialysis 03/31/2019 no fluid removed  Blood pressure 169/97 pulse 6 6 temperature 98.1 O2 sats 98% room air  Blood pressure 132/75 pulse 72 temperature 98.4 O2 sats 94% room air  Sodium 137 potassium 4.6 chloride 103 CO2 23 BUN 45 creatinine 5.46 glucose 80 calcium 8.1 phosphorus 5.9 albumin 2.8.  WBC 10.6 hemoglobin 9.7 platelets 180  Amlodipine 10 mg daily cyclophosphamide 125 mg daily darbepoetin 100 mcg every Sunday hydralazine 25 mg every 8 hours insulin sliding scale, prednisone 60 mg daily Bactrim 1 tablet daily Flomax 0.4 mg daily   Objective:  Vital signs in last 24 hours:  Temp:  [98.1 F (36.7 C)-99.8 F (37.7 C)] 98.1 F (36.7 C) (03/31 0443) Pulse Rate:  [66-81] 66 (03/31 0443) Resp:  [18-19] 18 (03/31 0443) BP: (132-169)/(75-97) 169/97 (03/31 0443) SpO2:  [94 %-98 %] 98 % (03/31 0443)  Weight change:  Filed Weights   03/31/19 0109 03/31/19 1302 03/31/19 1641  Weight: 71.2 kg 71.9 kg 71.9 kg    Intake/Output: I/O last 3 completed shifts: In: 1300 [P.O.:1300] Out: 900 [Urine:900]   Intake/Output this shift:  No intake/output data recorded.  Gen: Comfortably resting in bed, watching television CVS: Pulse regular rhythm, normal rate, S1 and S2 normal Resp: Clear to auscultation, no rales/rhonchi.  Right IJ TDC. Abd: Soft, obese, nontender Ext: No lower extremity edema   Basic  Metabolic Panel: Recent Labs  Lab 03/29/19 0402 03/29/19 0402 03/30/19 0418 03/30/19 0418 03/31/19 0530 04/01/19 0625 04/02/19 0447  NA 142  --  139  --  140 140 137  K 4.5  --  4.6  --  4.9 4.4 4.6  CL 103  --  103  --  105 101 103  CO2 27  --  23  --  21* 27 23  GLUCOSE 79  --  114*  --  131* 88 80  BUN 43*  --  55*  --  64* 31* 45*  CREATININE 4.66*  --  5.80*  --  6.62* 4.32* 5.46*  CALCIUM 8.3*   < > 8.1*   < > 8.0* 7.8* 8.1*  PHOS 5.8*  --  6.3*  --  6.1* 5.7* 5.9*   < > = values in this interval not displayed.    Liver Function Tests: Recent Labs  Lab 03/29/19 0402 03/30/19 0418 03/31/19 0530 04/01/19 0625 04/02/19 0447  ALBUMIN 2.8* 2.6* 2.6* 2.6* 2.8*   No results for input(s): LIPASE, AMYLASE in the last 168 hours. No results for input(s): AMMONIA in the last 168 hours.  CBC: Recent Labs  Lab 03/29/19 0402  WBC 10.6*  NEUTROABS 9.0*  HGB 9.7*  HCT 30.2*  MCV 94.7  PLT 180    Cardiac Enzymes: No results for input(s): CKTOTAL, CKMB, CKMBINDEX, TROPONINI in the last 168 hours.  BNP: Invalid input(s): POCBNP  CBG: Recent Labs  Lab 04/01/19 0638 04/01/19 1116 04/01/19 1622 04/01/19 2117 04/02/19 0635  GLUCAP 76 85  132* 107* 136*    Microbiology: Results for orders placed or performed during the hospital encounter of 03/10/19  SARS CORONAVIRUS 2 (TAT 6-24 HRS) Nasopharyngeal Nasopharyngeal Swab     Status: None   Collection Time: 03/10/19  9:42 AM   Specimen: Nasopharyngeal Swab  Result Value Ref Range Status   SARS Coronavirus 2 NEGATIVE NEGATIVE Final    Comment: (NOTE) SARS-CoV-2 target nucleic acids are NOT DETECTED. The SARS-CoV-2 RNA is generally detectable in upper and lower respiratory specimens during the acute phase of infection. Negative results do not preclude SARS-CoV-2 infection, do not rule out co-infections with other pathogens, and should not be used as the sole basis for treatment or other patient management  decisions. Negative results must be combined with clinical observations, patient history, and epidemiological information. The expected result is Negative. Fact Sheet for Patients: SugarRoll.be Fact Sheet for Healthcare Providers: https://www.woods-mathews.com/ This test is not yet approved or cleared by the [place] FDA and  has been authorized for detection and/or diagnosis of SARS-CoV-2 by FDA under an Emergency Use Authorization (EUA). This EUA will remain  in effect (meaning this test can be used) for the duration of the COVID-19 declaration under Section 56 4(b)(1) of the Act, 21 U.S.C. section 360bbb-3(b)(1), unless the authorization is terminated or revoked sooner. Performed at [facility], [place] [address], [place], [place] [address]     Coagulation Studies: No results for input(s): LABPROT, INR in the last 72 hours.  Urinalysis: No results for input(s): COLORURINE, LABSPEC, PHURINE, GLUCOSEU, HGBUR, BILIRUBINUR, KETONESUR, PROTEINUR, UROBILINOGEN, NITRITE, LEUKOCYTESUR in the last 72 hours.  Invalid input(s): APPERANCEUR    Imaging: No results found.   Medications:    . amLODipine  10 mg Oral Daily  . Chlorhexidine Gluconate Cloth  6 each Topical Q0600  . cyclophosphamide  125 mg Oral Daily  . darbepoetin (ARANESP) injection - NON-DIALYSIS  100 mcg Subcutaneous Q Sun-1800  . heparin injection (subcutaneous)  5,000 Units Subcutaneous Q8H  . hydrALAZINE  25 mg Oral Q8H  . insulin aspart  0-9 Units Subcutaneous TID WC  . predniSONE  60 mg Oral Q breakfast  . sulfamethoxazole-trimethoprim  1 tablet Oral Daily  . tamsulosin  0.4 mg Oral Daily   acetaminophen **OR** acetaminophen, ondansetron **OR** ondansetron (ZOFRAN) IV, zolpidem  Assessment/ Plan:  1. Acute kidney Injury: Nonoliguric.  Unknown renal baseline.  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  In spite of his elevated creatinine, he had limited scarring indicating salvageable renal tissue for which she was started on cyclophosphamide and corticosteroids. Process started for outpatient dialysis unit placement along with attempt to procure cyclophosphamide.  Another question may be as to whether outpatient rituximab infusions may be another option (especially with his current social situation).  Unclear if patient is going to recover at this point.   We will continue to follow renal panel.  We will continue to follow renal panel no dialysis scheduled. 2.  Hypertension: Blood pressure with intermittent elevations on amlodipine, continue to monitor with HD. 3.  Anemia: Iron stores are repleted.  He is on Aranesp. 4.  Hyperphosphatemia: Secondary to acute kidney injury and impaired phosphorus handling, monitor with HD. 5.  ANCA vasculitis MPO.  We will continue to see if patient is absolutely plan dialysis dependent.  We will not know this for several days.  Would recommend rapid taper of prednisone over 4  weeks and discontinuation of immunosuppressants if no recovery in 4 weeks if patient is dialysis dependent   LOS: [place] @TODAY @8 :02 AM

## 2019-04-02 NOTE — Progress Notes (Signed)
PROGRESS NOTE    Gabriel Harris  HYI:[id number] DOB: [birth year] DOA: 03/10/2019 PCP: Patient, No Pcp Per   Brief Narrative: Patient is a 59 year old male with history of tobacco abuse who presented to the emergency department with 2 weeks history of worsening dizziness, nausea, tinnitus, diplopia, diarrhea, vomiting.  On presentation, MRI was negative for any acute intracranial findings but he was found to have severe AKI with creatinine greater than 10.  Started on IV fluids, nephrology consulted.  Started on hemodialysis.  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 He has been started  immunosuppressants.  There was no evidence of renal recovery, so he  will probably be on dialysis on permanent basis.  Process started for outpatient dialysis unit placement.  Waiting for nephrology clearance for discharge.  Assessment & Plan:   Principal Problem:   AKI (acute kidney injury) ([place]) Active Problems:   Hyperkalemia   Normocytic anemia   Hypoalbuminemia   Sinus bradycardia   Tobacco use   Vertigo   Diplopia   Severe AKI  versus progressive CKD: Had hematuria on presentation.   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 He has been started  immunosuppressants.  There was no evidence of renal recovery, so he  will probably be on dialysis on permanent basis.  Process started for outpatient dialysis unit placement.  Received bolus steroids for 3 days, now on prednisone 60 mg daily, Cytoxan 125 mg daily. Started on bactrim.He is  Nonoliguric.  Further management plan as per nephrology. Dialysis not done today.  Hyperkalemia: Resolved with Lokelma and with dialysis  Hypertension: Continue amlodipine and  hydralazine  Vertigo/diplopia: Overall improved.  MRI did not show any acute findings.  Chronic normocytic anemia:  Associated with CKD.  Continue ESA.  Currently CBC stable  Leukocytosis: Suspected to be reactive,associated  with steroids. Continue to monitor  Sinus bradycardia: Stable  Tobacco use: Cessation advised.  He says he smokes 2 to 3 packs of cigarettes a day.  Low albumin : Likely in the setting of nephrotic syndrome/CKD.         DVT prophylaxis: Heparin [place] Code Status: Full Family Communication: None present at the bedside. Disposition Plan: Patient is from home. Discharge plan to home after nephrology clearance, needs  outpatient dialysis center . Case manager  also assisting with his immunosuppressants, medications cost.Insurance is an issue  Consultants: Nephrology  Procedures: Dialysis catheter placement  Antimicrobials:  Anti-infectives (From admission, onward)   Start     Dose/Rate Route Frequency Ordered Stop   03/12/19 1536  ceFAZolin (ANCEF) 2-4 GM/100ML-% IVPB    Note to Pharmacy: Rodney Booze   : cabinet override      03/12/19 1536 03/12/19 1606   03/12/19 1300  sulfamethoxazole-trimethoprim (BACTRIM) 400-80 MG per tablet 1 tablet     1 tablet Oral Daily 03/12/19 1259     03/12/19 0800  ceFAZolin (ANCEF) IVPB 2g/100 mL premix     2 g 200 mL/hr over 30 Minutes Intravenous  Once 03/11/19 1615 03/12/19 1610      Subjective: Patient seen and examined at the bedside this morning.  Hemodynamically stable.  Standing at the door.  Denies any complaints.  Very comfortable.  His mood fluctuates very often.  He was on pleasant  mood today.  Objective: Vitals:   04/01/19 1203 04/01/19 1658 04/01/19 2045 04/02/19 0443  BP: (!) 143/92 135/78 140/83 (!) 169/97  Pulse: 81 78 66 66  Resp: 19 18 18 18   Temp: 99.8 F (37.7 C) 98.8 F (37.1 C) 98.1 F (36.7 C) 98.1 F (36.7 C)  TempSrc: Oral Oral Oral Oral  SpO2: 96% 95% 96% 98%  Weight:      Height:        Intake/Output Summary (Last 24 hours) at 04/02/2019 0802 Last data filed at 04/02/2019 0601 Gross per 24 hour  Intake 1060 ml  Output 600 ml  Net 460 ml   Filed Weights   03/31/19 0109 03/31/19 1302 03/31/19 1641    Weight: 71.2 kg 71.9 kg 71.9 kg    Examination:   General exam: Comfortable Respiratory system:no wheezes or crackles  Cardiovascular system: S1 & S2 heard, RRR. No JVD, murmurs, rubs, gallops or clicks.  Dialysis catheter on the right chest Gastrointestinal system: Abdomen is nondistended, soft and nontender Central nervous system: Alert and oriented. No focal neurological deficits. Extremities: No edema, no clubbing ,no cyanosis Skin: No rashes, lesions or ulcers,no icterus ,no pallor    Data Reviewed: I have personally reviewed following labs and imaging studies  CBC: Recent Labs  Lab 03/29/19 0402  WBC 10.6*  NEUTROABS 9.0*  HGB 9.7*  HCT 30.2*  MCV 94.7  PLT 287   Basic Metabolic Panel: Recent Labs  Lab 03/29/19 0402 03/30/19 0418 03/31/19 0530 04/01/19 0625 04/02/19 0447  NA 142 139 140 140 137  K 4.5 4.6 4.9 4.4 4.6  CL 103 103 105 101 103  CO2 27 23 21* 27 23  GLUCOSE 79 114* 131* 88 80  BUN 43* 55* 64* 31* 45*  CREATININE 4.66* 5.80* 6.62* 4.32* 5.46*  CALCIUM 8.3* 8.1* 8.0* 7.8* 8.1*  PHOS 5.8* 6.3* 6.1* 5.7* 5.9*   GFR: Estimated Creatinine Clearance: 12.8 mL/min (A) (by C-G formula based on SCr of 5.46 mg/dL (H)). Liver Function Tests: Recent Labs  Lab 03/29/19 0402 03/30/19 0418 03/31/19 0530 04/01/19 0625 04/02/19 0447  ALBUMIN 2.8* 2.6* 2.6* 2.6* 2.8*   No results for input(s): LIPASE, AMYLASE in the last 168 hours. No results for input(s): AMMONIA in the last 168 hours. Coagulation Profile: No results for input(s): INR, PROTIME in the last 168 hours. Cardiac Enzymes: No results for input(s): CKTOTAL, CKMB, CKMBINDEX, TROPONINI in the last 168 hours. BNP (last 3 results) No results for input(s): PROBNP in the last 8760 hours. HbA1C: No results for input(s): HGBA1C in the last 72 hours. CBG: Recent Labs  Lab 04/01/19 0638 04/01/19 1116 04/01/19 1622 04/01/19 2117 04/02/19 0635  GLUCAP 76 85 132* 107* 136*   Lipid  Profile: No results for input(s): CHOL, HDL, LDLCALC, TRIG, CHOLHDL, LDLDIRECT in the last 72 hours. Thyroid Function Tests: No results for input(s): TSH, T4TOTAL, FREET4, T3FREE, THYROIDAB in the last 72 hours. Anemia Panel: No results for input(s): VITAMINB12, FOLATE, FERRITIN, TIBC, IRON, RETICCTPCT in the last 72 hours. Sepsis Labs: No results for input(s): PROCALCITON, LATICACIDVEN in the last 168 hours.  No results found for this or any previous visit (from the past 240 hour(s)).       Radiology Studies: No results found.      Scheduled Meds: . amLODipine  10 mg Oral Daily  . Chlorhexidine Gluconate Cloth  6 each Topical Q0600  . cyclophosphamide  125 mg Oral Daily  . darbepoetin (ARANESP) injection - NON-DIALYSIS  100 mcg Subcutaneous Q Sun-1800  . heparin injection (subcutaneous)  5,000 Units Subcutaneous Q8H  . hydrALAZINE  25 mg Oral Q8H  . insulin aspart  0-9 Units Subcutaneous TID  WC  . predniSONE  60 mg Oral Q breakfast  . sulfamethoxazole-trimethoprim  1 tablet Oral Daily  . tamsulosin  0.4 mg Oral Daily   Continuous Infusions:   LOS: 22 days    Time spent: 25 mins.More than 50% of that time was spent in counseling and/or coordination of care.      Shelly Coss, MD [facility] P3/31/2021, 8:02 AM

## 2019-04-02 NOTE — Plan of Care (Signed)
  Problem: Education: Goal: Knowledge of disease and its progression will improve Outcome: Progressing   Problem: Self-Concept: Goal: Body image disturbance will be avoided or minimized Outcome: Progressing

## 2019-04-03 LAB — RENAL FUNCTION PANEL
Albumin: 2.9 g/dL — ABNORMAL LOW (ref 3.5–5.0)
Anion gap: 13 (ref 5–15)
BUN: 61 mg/dL — ABNORMAL HIGH (ref 6–20)
CO2: 25 mmol/L (ref 22–32)
Calcium: 8.3 mg/dL — ABNORMAL LOW (ref 8.9–10.3)
Chloride: 103 mmol/L (ref 98–111)
Creatinine, Ser: 6.79 mg/dL — ABNORMAL HIGH (ref 0.61–1.24)
GFR calc Af Amer: 9 mL/min — ABNORMAL LOW (ref >60)
GFR calc non Af Amer: 8 mL/min — ABNORMAL LOW (ref >60)
Glucose, Bld: 97 mg/dL (ref 70–99)
Phosphorus: 5.2 mg/dL — ABNORMAL HIGH (ref 2.5–4.6)
Potassium: 4.2 mmol/L (ref 3.5–5.1)
Sodium: 141 mmol/L (ref 135–145)

## 2019-04-03 LAB — CBC
HCT: 33.4 % — ABNORMAL LOW (ref 39.0–52.0)
HCT: 33.8 % — ABNORMAL LOW (ref 39.0–52.0)
Hemoglobin: 10.5 g/dL — ABNORMAL LOW (ref 13.0–17.0)
Hemoglobin: 10.7 g/dL — ABNORMAL LOW (ref 13.0–17.0)
MCH: 31.2 pg (ref 26.0–34.0)
MCH: 31.3 pg (ref 26.0–34.0)
MCHC: 31.4 g/dL (ref 30.0–36.0)
MCHC: 31.7 g/dL (ref 30.0–36.0)
MCV: 99.1 fL (ref 80.0–100.0)
MCV: 98.8 fL (ref 80.0–100.0)
Platelets: 184 10*3/uL (ref 150–400)
Platelets: 195 10*3/uL (ref 150–400)
RBC: 3.37 MIL/uL — ABNORMAL LOW (ref 4.22–5.81)
RBC: 3.42 MIL/uL — ABNORMAL LOW (ref 4.22–5.81)
RDW: 18.5 % — ABNORMAL HIGH (ref 11.5–15.5)
RDW: 18.5 % — ABNORMAL HIGH (ref 11.5–15.5)
WBC: 8 10*3/uL (ref 4.0–10.5)
WBC: 8.1 10*3/uL (ref 4.0–10.5)
nRBC: 0.7 % — ABNORMAL HIGH (ref 0.0–0.2)
nRBC: 0.9 % — ABNORMAL HIGH (ref 0.0–0.2)

## 2019-04-03 LAB — GLUCOSE, CAPILLARY
Glucose-Capillary: 79 mg/dL (ref 70–99)
Glucose-Capillary: 91 mg/dL (ref 70–99)
Glucose-Capillary: 152 mg/dL — ABNORMAL HIGH (ref 70–99)
Glucose-Capillary: 106 mg/dL — ABNORMAL HIGH (ref 70–99)

## 2019-04-03 LAB — DIFFERENTIAL
Abs Immature Granulocytes: 0.11 10*3/uL — ABNORMAL HIGH (ref 0.00–0.07)
Basophils Absolute: 0 10*3/uL (ref 0.0–0.1)
Basophils Relative: 0 %
Eosinophils Absolute: 0.1 10*3/uL (ref 0.0–0.5)
Eosinophils Relative: 1 %
Immature Granulocytes: 1 %
Lymphocytes Relative: 6 %
Lymphs Abs: 0.5 10*3/uL — ABNORMAL LOW (ref 0.7–4.0)
Monocytes Absolute: 0.7 10*3/uL (ref 0.1–1.0)
Monocytes Relative: 9 %
Neutro Abs: 6.6 10*3/uL (ref 1.7–7.7)
Neutrophils Relative %: 83 %

## 2019-04-03 NOTE — Progress Notes (Signed)
[facility] ROUNDING NOTE   Subjective:   This is a 74-year gentleman history tobacco abuse presented with some neurological symptoms.  MRI was negative for acute intracranial findings was found acute kidney injury with a creatinine greater than 10.  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  Good urine output being treated aggressively based on optimistic biopsy findings.  Will continue to follow renal function   Patient has received 3 pulse doses of steroids and is continuing on Cytoxan 125 mg daily.  Dialysis 03/31/2019 no fluid removed  Blood pressure 152/86 pulse 75 temperature 98 O2 sats 90% room air Urine output 865 cc 04/02/2019 weight 72 kg stable  Sodium 141 potassium 4.2 chloride 103 CO2 25 BUN 61 creatinine 6.79 glucose 97 calcium 8.3 phosphorus 5.2 albumin 2.9 hemoglobin 10.5  Amlodipine 10 mg daily cyclophosphamide 125 mg daily darbepoetin 100 mcg every Sunday hydralazine 25 mg every 8 hours insulin sliding scale, prednisone 60 mg daily Bactrim 1 tablet daily Flomax 0.4 mg daily   Objective:  Vital signs in last 24 hours:  Temp:  [97.6 F (36.4 C)-98.3 F (36.8 C)] 98 F (36.7 C) (04/01 0938) Pulse Rate:  [66-75] 75 (04/01 0938) Resp:  [15-18] 18 (04/01 0938) BP: (140-178)/(80-95) 152/86 (04/01 0938) SpO2:  [96 %-100 %] 100 % (04/01 0938) Weight:  [72 kg] 72 kg (04/01 0529)  Weight change:  Filed Weights   03/31/19 1302 03/31/19 1641 04/03/19 0529  Weight: 71.9 kg 71.9 kg 72 kg    Intake/Output: I/O last 3 completed shifts: In: 4081 [P.O.:1042] Out: 1540 [Urine:1540]   Intake/Output this shift:  Total I/O In: 240 [P.O.:240] Out: -   Gen: Comfortably resting in bed, watching television CVS: Pulse regular rhythm, normal rate, S1 and S2 normal Resp: Clear to auscultation, no rales/rhonchi.  Right IJ TDC. Abd: Soft, obese, nontender Ext: No lower extremity edema   Basic Metabolic Panel: Recent Labs  Lab 03/30/19 0418  03/30/19 0418 03/31/19 0530 03/31/19 0530 04/01/19 0625 04/02/19 0447 04/03/19 0812  NA 139  --  140  --  140 137 141  K 4.6  --  4.9  --  4.4 4.6 4.2  CL 103  --  105  --  101 103 103  CO2 23  --  21*  --  27 23 25   GLUCOSE 114*  --  131*  --  88 80 97  BUN 55*  --  64*  --  31* 45* 61*  CREATININE 5.80*  --  6.62*  --  4.32* 5.46* 6.79*  CALCIUM 8.1*   < > 8.0*   < > 7.8* 8.1* 8.3*  PHOS 6.3*  --  6.1*  --  5.7* 5.9* 5.2*   < > = values in this interval not displayed.    Liver Function Tests: Recent Labs  Lab 03/30/19 0418 03/31/19 0530 04/01/19 0625 04/02/19 0447 04/03/19 0812  ALBUMIN 2.6* 2.6* 2.6* 2.8* 2.9*   No results for input(s): LIPASE, AMYLASE in the last 168 hours. No results for input(s): AMMONIA in the last 168 hours.  CBC: Recent Labs  Lab 03/29/19 0402 04/03/19 0812  WBC 10.6* 8.0  NEUTROABS 9.0*  --   HGB 9.7* 10.5*  HCT 30.2* 33.4*  MCV 94.7 99.1  PLT 180 184    Cardiac Enzymes: No results for input(s): CKTOTAL, CKMB, CKMBINDEX, TROPONINI in the last 168 hours.  BNP: Invalid input(s): POCBNP  CBG: Recent Labs  Lab 04/02/19 0635 04/02/19 1117 04/02/19 1636 04/02/19 2101 04/03/19  [place]    Microbiology: Results for orders placed or performed during the hospital encounter of 03/10/19  SARS CORONAVIRUS 2 (TAT 6-24 HRS) Nasopharyngeal Nasopharyngeal Swab     Status: None   Collection Time: 03/10/19  9:42 AM   Specimen: Nasopharyngeal Swab  Result Value Ref Range Status   SARS Coronavirus 2 NEGATIVE NEGATIVE Final    Comment: (NOTE) SARS-CoV-2 target nucleic acids are NOT DETECTED. The SARS-CoV-2 RNA is generally detectable in upper and lower respiratory specimens during the acute phase of infection. Negative results do not preclude SARS-CoV-2 infection, do not rule out co-infections with other pathogens, and should not be used as the sole basis for treatment or other patient management  decisions. Negative results must be combined with clinical observations, patient history, and epidemiological information. The expected result is Negative. Fact Sheet for Patients: SugarRoll.be Fact Sheet for Healthcare Providers: https://www.woods-mathews.com/ This test is not yet approved or cleared by the [place] FDA and  has been authorized for detection and/or diagnosis of SARS-CoV-2 by FDA under an Emergency Use Authorization (EUA). This EUA will remain  in effect (meaning this test can be used) for the duration of the COVID-19 declaration under Section 56 4(b)(1) of the Act, 21 U.S.C. section 360bbb-3(b)(1), unless the authorization is terminated or revoked sooner. Performed at [facility], [place] [address], [place], [place] [address]     Coagulation Studies: No results for input(s): LABPROT, INR in the last 72 hours.  Urinalysis: No results for input(s): COLORURINE, LABSPEC, PHURINE, GLUCOSEU, HGBUR, BILIRUBINUR, KETONESUR, PROTEINUR, UROBILINOGEN, NITRITE, LEUKOCYTESUR in the last 72 hours.  Invalid input(s): APPERANCEUR    Imaging: No results found.   Medications:    . amLODipine  10 mg Oral Daily  . Chlorhexidine Gluconate Cloth  6 each Topical Q0600  . cyclophosphamide  125 mg Oral Daily  . darbepoetin (ARANESP) injection - NON-DIALYSIS  100 mcg Subcutaneous Q Sun-1800  . hydrALAZINE  25 mg Oral Q8H  . insulin aspart  0-9 Units Subcutaneous TID WC  . predniSONE  60 mg Oral Q breakfast  . sulfamethoxazole-trimethoprim  1 tablet Oral Daily  . tamsulosin  0.4 mg Oral Daily   acetaminophen **OR** acetaminophen, ondansetron **OR** ondansetron (ZOFRAN) IV, zolpidem  Assessment/ Plan:  1. Acute kidney Injury: Nonoliguric.  Unknown renal baseline.  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  In spite of his elevated creatinine, he had limited scarring indicating salvageable renal tissue for which she was started on cyclophosphamide and corticosteroids. Process started for outpatient dialysis unit placement along with attempt to procure cyclophosphamide.  Another question may be as to whether outpatient rituximab infusions may be another option (especially with his current social situation).  Unclear if patient is going to recover at this point.   We will continue to follow renal panel.  We will continue to follow renal panel no dialysis scheduled. 2.  Hypertension: Blood pressure with intermittent elevations on amlodipine, continue to monitor with HD. 3.  Anemia: Iron stores are repleted.  He is on Aranesp. 4.  Hyperphosphatemia: Secondary to acute kidney injury and impaired phosphorus handling, monitor with HD. 5.  ANCA vasculitis MPO.  We will continue to see if patient is absolutely plan dialysis dependent.  We will not know this for several days.  Would recommend rapid taper of prednisone over 4 weeks and discontinuation of immunosuppressants  if no recovery in 4 weeks if patient is dialysis dependent   LOS: [place] @TODAY @10 :09 AM

## 2019-04-03 NOTE — Progress Notes (Signed)
Pt refusing Heparin sq. Risk explained by RN. Pt is ambulatory. SCDs ordered when pt in bed.  KJKG, NP [facility]

## 2019-04-03 NOTE — Plan of Care (Signed)
  Problem: Clinical Measurements: Goal: Diagnostic test results will improve Outcome: Progressing   Problem: Education: Goal: Knowledge of disease and its progression will improve Outcome: Progressing

## 2019-04-03 NOTE — TOC Progression Note (Signed)
Transition of Care [facility]) - Progression Note    Patient Details  Name: Gabriel Harris MRN: [id number] Date of Birth: [birth year]  [facility] CM/SW [place], RN Phone Number: 04/03/2019, 5:20 PM  Clinical Narrative:    Spoke to [name]. He stated with family help he could pay up to 300 a month for medication. Have not heard back from this mornings emails.  Looked in to good Rx and found [place] had the lowest 60 pills of Cyclophosphamide 50 mg caps at $139 and 30 25mg  caps at $115 dollars. Reported this back to MD. Dr. Maren Beach. Gave patient  Paperwork to fill out for medication program as well. Faxed and copy given to patient and copy on chart. Renal Navigator aware of events. Potential discharge in AM.  Will be going to stay with family in [place].    Expected Discharge Plan: ([facility]omes[facility] shelter resources discussed with patient) Barriers to Discharge: Other (comment)(Patient is homeless and will be discharged today) Will be staying with family in [place]  Expected Discharge Plan and Services Expected Discharge Plan: ([place] resources discussed with patient) In-house Referral: Clinical Social Work(CSW was informed that patient needed to talk with a case worker)                                             Social Determinants of Health (SDOH) Interventions    Readmission Risk Interventions No flowsheet data found.

## 2019-04-03 NOTE — Progress Notes (Signed)
PROGRESS NOTE    Gabriel Harris  YBO:[id number] DOB: 01-Mar-1960 DOA: 03/10/2019 PCP: Patient, No Pcp Per   Brief Narrative: Patient is a 59 year old male with history of tobacco abuse who presented to the emergency department with 2 weeks history of worsening dizziness, nausea, tinnitus, diplopia, diarrhea, vomiting.  On presentation, MRI was negative for any acute intracranial findings but he was found to have severe AKI with creatinine greater than 10.  Started on IV fluids, nephrology consulted.  Started on hemodialysis.  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 He has been started  immunosuppressants.  There was no evidence of renal recovery, so he  will probably be on dialysis on permanent basis.  Process started for outpatient dialysis unit placement.  Waiting for nephrology clearance for discharge.  Subjective This morning sleeping in the bed woke up and showed me the paperwork which said Medicaid pending No nausea vomiting fever chills.  Resting comfortably. Overnight no fever, no acute events.  Patient refused heparin subcu.  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  No evidence of renal recovery, unclear if he recovers- nephro following renal panel and no HD scheduled.Process started for outpatient dialysis unit placement. Received bolus steroids for 3 days, now on prednisone 60 mg daily, Cytoxan 125 mg daily. Started on bactrim. Neprho planning rapid taper of prednisone over 4 weeks and discontinuation of immunosuppressive therapy if no recovery in 4 weeks if patient is dialysis dependent   Hyperkalemia: Resolved with Lokelma and with dialysis  Hypertension: BP borderline controlled, continue amlodipine and  Hydralazine.  Monitor and adjust meds.  Vertigo/diplopia: Overall improved.  MRI did  not show any acute findings.  Chronic normocytic anemia:  Associated with CKD.  Continue ESA.  So far stable.  Leukocytosis: Suspected to be reactive,associated with steroids.resolved  Sinus bradycardia: Stable  Tobacco use: Cessation advised.  He says he smokes 2 to 3 packs of cigarettes a day.  Low albumin : Likely in the setting of nephrotic syndrome/CKD.  DVT prophylaxis: Heparin [place] Code Status: Full Family Communication: None present at the bedside.  Disposition Plan: Patient is from home. Discharge plan to home after nephrology clearance, needs  outpatient dialysis center physical during dialysis.  Currently remains on immunosuppressants and steroids.  Case manager involved and disposition difficult due to lack of insurance.  Consultants: Nephrology  Procedures: Dialysis catheter placement  Antimicrobials:  Anti-infectives (From admission, onward)   Start     Dose/Rate Route Frequency Ordered Stop   03/12/19 1536  ceFAZolin (ANCEF) 2-4 GM/100ML-% IVPB    Note to Pharmacy: Rodney Booze   : cabinet override      03/12/19 1536 03/12/19 1606   03/12/19 1300  sulfamethoxazole-trimethoprim (BACTRIM) 400-80 MG per tablet 1 tablet     1 tablet Oral Daily 03/12/19 1259     03/12/19 0800  ceFAZolin (ANCEF) IVPB 2g/100 mL premix     2 g 200 mL/hr over 30 Minutes Intravenous  Once 03/11/19 1615 03/12/19 1610      Objective: Vitals:   04/02/19 1636 04/02/19 2100 04/03/19 0529 04/03/19 0938  BP: (!) 149/84 140/80 (!) 178/95 (!) 152/86  Pulse: 75 75 68 75  Resp: 18   18  Temp: 98.1 F (36.7 C) 98 F (36.7 C) 97.6 F (36.4 C) 98 F (36.7 C)  TempSrc: Oral Oral Oral Oral  SpO2: 97% 97% 96% 100%  Weight:   72 kg   Height:        Intake/Output Summary (Last 24 hours) at 04/03/2019 1424 Last data filed at 04/03/2019 0804 Gross per 24 hour  Intake 480 ml  Output 1050 ml  Net -570 ml   Filed Weights   03/31/19 1302 03/31/19 1641 04/03/19 0529  Weight: 71.9 kg 71.9 kg 72  kg    Examination:  General exam: AAOx3 , NAD, weak appearing. HEENT:Oral mucosa moist, Ear/Nose WNL grossly, dentition normal. Respiratory system: bilaterally clear,no wheezing or crackles,no use of accessory muscle Cardiovascular system: S1 & S2 +, No JVD,. Gastrointestinal system: Abdomen soft, NT,ND, BS+ Nervous System:Alert, awake, moving extremities and grossly nonfocal Extremities: No edema, distal peripheral pulses palpable.  Skin: No rashes,no icterus. MSK: Normal muscle bulk,tone, power  Data Reviewed: I have personally reviewed following labs and imaging studies  CBC: Recent Labs  Lab 03/29/19 0402 04/03/19 0812  WBC 10.6* 8.0  NEUTROABS 9.0*  --   HGB 9.7* 10.5*  HCT 30.2* 33.4*  MCV 94.7 99.1  PLT 180 191   Basic Metabolic Panel: Recent Labs  Lab 03/30/19 0418 03/31/19 0530 04/01/19 0625 04/02/19 0447 04/03/19 0812  NA 139 140 140 137 141  K 4.6 4.9 4.4 4.6 4.2  CL 103 105 101 103 103  CO2 23 21* 27 23 25   GLUCOSE 114* 131* 88 80 97  BUN 55* 64* 31* 45* 61*  CREATININE 5.80* 6.62* 4.32* 5.46* 6.79*  CALCIUM 8.1* 8.0* 7.8* 8.1* 8.3*  PHOS 6.3* 6.1* 5.7* 5.9* 5.2*   GFR: Estimated Creatinine Clearance: 10.3 mL/min (A) (by C-G formula based on SCr of 6.79 mg/dL (H)). Liver Function Tests: Recent Labs  Lab 03/30/19 0418 03/31/19 0530 04/01/19 0625 04/02/19 0447 04/03/19 0812  ALBUMIN 2.6* 2.6* 2.6* 2.8* 2.9*   No results for input(s): LIPASE, AMYLASE in the last 168 hours. No results for input(s): AMMONIA in the last 168 hours. Coagulation Profile: No results for input(s): INR, PROTIME in the last 168 hours. Cardiac Enzymes: No results for input(s): CKTOTAL, CKMB, CKMBINDEX, TROPONINI in the last 168 hours. BNP (last 3 results) No results for input(s): PROBNP in the last 8760 hours. HbA1C: No results for input(s): HGBA1C in the last 72 hours. CBG: Recent Labs  Lab 04/02/19 1117 04/02/19 1636 04/02/19 2101 04/03/19 0639 04/03/19 1119   GLUCAP 101* 164* 145* 79 91   Lipid Profile: No results for input(s): CHOL, HDL, LDLCALC, TRIG, CHOLHDL, LDLDIRECT in the last 72 hours. Thyroid Function Tests: No results for input(s): TSH, T4TOTAL, FREET4, T3FREE, THYROIDAB in the last 72 hours. Anemia Panel: No results for input(s): VITAMINB12, FOLATE, FERRITIN, TIBC, IRON, RETICCTPCT in the last 72 hours. Sepsis Labs: No results for input(s): PROCALCITON, LATICACIDVEN in the last 168 hours.  No results found for this or any previous visit (from the past 240 hour(s)).       Radiology Studies: No results found.      Scheduled Meds: . amLODipine  10 mg Oral Daily  . Chlorhexidine Gluconate Cloth  6 each Topical Q0600  . cyclophosphamide  125 mg Oral Daily  . darbepoetin (ARANESP) injection - NON-DIALYSIS  100 mcg Subcutaneous Q Sun-1800  . hydrALAZINE  25 mg Oral Q8H  . insulin aspart  0-9 Units Subcutaneous TID WC  . predniSONE  60 mg Oral Q breakfast  . sulfamethoxazole-trimethoprim  1 tablet Oral Daily  . tamsulosin  0.4 mg Oral Daily   Continuous Infusions:  LOS: 23 days    Time spent: 25 mins.More than 50% of that time was spent in counseling and/or coordination of care.  Antonieta Pert, MD [facility] P4/01/2019, 2:24 PM

## 2019-04-03 NOTE — TOC Progression Note (Addendum)
Transition of Care [facility]) - Progression Note    Patient Details  Name: Gabriel Harris MRN: [id number] Date of Birth: [birth year]  Transition of [facility]) CM/SW [place], RN Phone Number: 04/03/2019, 2:02 PM  Clinical Narrative:     Continue to look for medication assistance for cytoxan. Patient will need to be on this medication for several months according to MD. Researched different medication programs Appreciate pharmacy help does not qualify for [employer]. . Emailed to find connections with [employer], [employer], and  [employer]. Awaiting repliers.   Expected Discharge Plan: (Homesless shelter resources discussed with patient) Barriers to Discharge: Other (comment)(Patient is homeless and will be discharged today)  Expected Discharge Plan and Services Expected Discharge Plan: ([place] resources discussed with patient) In-house Referral: Clinical Social Work(CSW was informed that patient needed to talk with a case worker) Continue awaiting Medicaid  approval  Time spent 60 minutes                                             Social Determinants of Health (SDOH) Interventions    Readmission Risk Interventions No flowsheet data found.

## 2019-04-04 ENCOUNTER — Inpatient Hospital Stay (HOSPITAL_COMMUNITY): Payer: Medicaid Other

## 2019-04-04 HISTORY — PX: IR REMOVAL TUN CV CATH W/O FL: IMG2289

## 2019-04-04 LAB — GLUCOSE, CAPILLARY
Glucose-Capillary: 91 mg/dL (ref 70–99)
Glucose-Capillary: 98 mg/dL (ref 70–99)

## 2019-04-04 LAB — RENAL FUNCTION PANEL
Albumin: 2.7 g/dL — ABNORMAL LOW (ref 3.5–5.0)
Anion gap: 13 (ref 5–15)
BUN: 64 mg/dL — ABNORMAL HIGH (ref 6–20)
CO2: 22 mmol/L (ref 22–32)
Calcium: 8.5 mg/dL — ABNORMAL LOW (ref 8.9–10.3)
Chloride: 104 mmol/L (ref 98–111)
Creatinine, Ser: 6.95 mg/dL — ABNORMAL HIGH (ref 0.61–1.24)
GFR calc Af Amer: 9 mL/min — ABNORMAL LOW (ref >60)
GFR calc non Af Amer: 8 mL/min — ABNORMAL LOW (ref >60)
Glucose, Bld: 85 mg/dL (ref 70–99)
Phosphorus: 5.6 mg/dL — ABNORMAL HIGH (ref 2.5–4.6)
Potassium: 4.7 mmol/L (ref 3.5–5.1)
Sodium: 139 mmol/L (ref 135–145)

## 2019-04-04 MED ORDER — TAMSULOSIN HCL 0.4 MG PO CAPS: 0.4000 mg | ORAL_CAPSULE | Freq: Every day | ORAL | 0 refills | Status: AC

## 2019-04-04 MED ORDER — CYCLOPHOSPHAMIDE 25 MG PO CAPS: 125.0000 mg | ORAL_CAPSULE | Freq: Every day | ORAL | 1 refills | Status: DC

## 2019-04-04 MED ORDER — SULFAMETHOXAZOLE-TRIMETHOPRIM 400-80 MG PO TABS: 1.0000 | ORAL_TABLET | Freq: Every day | ORAL | 1 refills | Status: AC

## 2019-04-04 MED ORDER — AMLODIPINE BESYLATE 10 MG PO TABS: 10.0000 mg | ORAL_TABLET | Freq: Every day | ORAL | 1 refills | Status: DC

## 2019-04-04 MED ORDER — CYCLOPHOSPHAMIDE 50 MG PO CAPS: 100.0000 mg | ORAL_CAPSULE | Freq: Every day | ORAL | 0 refills | Status: AC

## 2019-04-04 MED ORDER — PREDNISONE 20 MG PO TABS: 60.0000 mg | ORAL_TABLET | Freq: Every day | ORAL | 1 refills | Status: AC

## 2019-04-04 MED ORDER — LIDOCAINE HCL 1 % IJ SOLN
INTRAMUSCULAR | Status: AC
  Filled 2019-04-04: qty 20

## 2019-04-04 MED ORDER — CHLORHEXIDINE GLUCONATE 4 % EX LIQD
CUTANEOUS | Status: DC | PRN
  Administered 2019-04-04: 1 via TOPICAL

## 2019-04-04 MED ORDER — HYDRALAZINE HCL 25 MG PO TABS: 25.0000 mg | ORAL_TABLET | Freq: Three times a day (TID) | ORAL | 1 refills | Status: DC

## 2019-04-04 MED ORDER — CHLORHEXIDINE GLUCONATE 4 % EX LIQD
CUTANEOUS | Status: AC
  Filled 2019-04-04: qty 15

## 2019-04-04 MED FILL — AMLODIPINE BESYLATE 10 MG T: 10 | 30 days supply | Qty: 30 | Fill #0

## 2019-04-04 MED FILL — SULFAMETHOXAZOLE-TMP SS TAB: 400-80 | 30 days supply | Qty: 30 | Fill #0

## 2019-04-04 MED FILL — predniSONE 20 MG TABS: 20 | 30 days supply | Qty: 90 | Fill #0

## 2019-04-04 MED FILL — TAMSULOSIN HCL 0.4 MG CAP: 0.4 | 30 days supply | Qty: 30 | Fill #0

## 2019-04-04 MED FILL — CYCLOPHOSPHAMIDE 50 MG CAPS: 50 | 5 days supply | Qty: 10 | Fill #0

## 2019-04-04 MED FILL — hydrALAZINE HCL 25 MG TABS: 25 | 30 days supply | Qty: 90 | Fill #0

## 2019-04-04 NOTE — Progress Notes (Signed)
[facility] ROUNDING NOTE   Subjective:   This is a 39-year gentleman history tobacco abuse presented with some neurological symptoms.  MRI was negative for acute intracranial findings was found acute kidney injury with a creatinine greater than 10.  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  Good urine output being treated aggressively based on optimistic biopsy findings.  Will continue to follow renal function   Patient has received 3 pulse doses of steroids and is continuing on Cytoxan 125 mg daily.  Dialysis 03/31/2019 no fluid removed  Blood pressure 156/86 pulse 70 temperature 98.1 O2 sats 97% room air Urine output 475 cc 04/03/2019  Sodium 139 potassium 4.7 chloride 104 CO2 22 BUN 64 creatinine 6.95 glucose 85 calcium 8.5 phosphorus 5.7 albumin 2.7  Amlodipine 10 mg daily cyclophosphamide 125 mg daily darbepoetin 100 mcg every Sunday hydralazine 25 mg every 8 hours insulin sliding scale, prednisone 60 mg daily Bactrim 1 tablet daily Flomax 0.4 mg daily   Objective:  Vital signs in last 24 hours:  Temp:  [97.7 F (36.5 C)-98.1 F (36.7 C)] 98.1 F (36.7 C) (04/02 0634) Pulse Rate:  [70-76] 70 (04/02 0634) Resp:  [18] 18 (04/02 0634) BP: (148-162)/(86-99) 156/87 (04/02 0634) SpO2:  [97 %-100 %] 97 % (04/02 0634)  Weight change:  Filed Weights   03/31/19 1302 03/31/19 1641 04/03/19 0529  Weight: 71.9 kg 71.9 kg 72 kg    Intake/Output: I/O last 3 completed shifts: In: 41 [P.O.:720] Out: 1050 [Urine:1050]   Intake/Output this shift:  Total I/O In: 240 [P.O.:240] Out: -   Gen: Comfortably resting in bed, watching television CVS: Pulse regular rhythm, normal rate, S1 and S2 normal Resp: Clear to auscultation, no rales/rhonchi.  Right IJ TDC. Abd: Soft, obese, nontender Ext: No lower extremity edema   Basic Metabolic Panel: Recent Labs  Lab 03/31/19 0530 03/31/19 0530 04/01/19 0625 04/01/19 0625 04/02/19 0447 04/03/19 0812  04/04/19 0441  NA 140  --  140  --  137 141 139  K 4.9  --  4.4  --  4.6 4.2 4.7  CL 105  --  101  --  103 103 104  CO2 21*  --  27  --  23 25 22   GLUCOSE 131*  --  88  --  80 97 85  BUN 64*  --  31*  --  45* 61* 64*  CREATININE 6.62*  --  4.32*  --  5.46* 6.79* 6.95*  CALCIUM 8.0*   < > 7.8*   < > 8.1* 8.3* 8.5*  PHOS 6.1*  --  5.7*  --  5.9* 5.2* 5.6*   < > = values in this interval not displayed.    Liver Function Tests: Recent Labs  Lab 03/31/19 0530 04/01/19 0625 04/02/19 0447 04/03/19 0812 04/04/19 0441  ALBUMIN 2.6* 2.6* 2.8* 2.9* 2.7*   No results for input(s): LIPASE, AMYLASE in the last 168 hours. No results for input(s): AMMONIA in the last 168 hours.  CBC: Recent Labs  Lab 03/29/19 0402 04/03/19 0812 04/03/19 1540  WBC 10.6* 8.0 8.1  NEUTROABS 9.0*  --  6.6  HGB 9.7* 10.5* 10.7*  HCT 30.2* 33.4* 33.8*  MCV 94.7 99.1 98.8  PLT 180 184 195    Cardiac Enzymes: No results for input(s): CKTOTAL, CKMB, CKMBINDEX, TROPONINI in the last 168 hours.  BNP: Invalid input(s): POCBNP  CBG: Recent Labs  Lab 04/03/19 0639 04/03/19 1119 04/03/19 1718 04/03/19 2208 04/04/19 0633  GLUCAP 79 91 152*  106* 70    Microbiology: Results for orders placed or performed during the hospital encounter of 03/10/19  SARS CORONAVIRUS 2 (TAT 6-24 HRS) Nasopharyngeal Nasopharyngeal Swab     Status: None   Collection Time: 03/10/19  9:42 AM   Specimen: Nasopharyngeal Swab  Result Value Ref Range Status   SARS Coronavirus 2 NEGATIVE NEGATIVE Final    Comment: (NOTE) SARS-CoV-2 target nucleic acids are NOT DETECTED. The SARS-CoV-2 RNA is generally detectable in upper and lower respiratory specimens during the acute phase of infection. Negative results do not preclude SARS-CoV-2 infection, do not rule out co-infections with other pathogens, and should not be used as the sole basis for treatment or other patient management decisions. Negative results must be combined with  clinical observations, patient history, and epidemiological information. The expected result is Negative. Fact Sheet for Patients: SugarRoll.be Fact Sheet for Healthcare Providers: https://www.woods-mathews.com/ This test is not yet approved or cleared by the [place] FDA and  has been authorized for detection and/or diagnosis of SARS-CoV-2 by FDA under an Emergency Use Authorization (EUA). This EUA will remain  in effect (meaning this test can be used) for the duration of the COVID-19 declaration under Section 56 4(b)(1) of the Act, 21 U.S.C. section 360bbb-3(b)(1), unless the authorization is terminated or revoked sooner. Performed at [facility], [place] [address], [place], [place] [address]     Coagulation Studies: No results for input(s): LABPROT, INR in the last 72 hours.  Urinalysis: No results for input(s): COLORURINE, LABSPEC, PHURINE, GLUCOSEU, HGBUR, BILIRUBINUR, KETONESUR, PROTEINUR, UROBILINOGEN, NITRITE, LEUKOCYTESUR in the last 72 hours.  Invalid input(s): APPERANCEUR    Imaging: No results found.   Medications:    . amLODipine  10 mg Oral Daily  . Chlorhexidine Gluconate Cloth  6 each Topical Q0600  . cyclophosphamide  125 mg Oral Daily  . darbepoetin (ARANESP) injection - NON-DIALYSIS  100 mcg Subcutaneous Q Sun-1800  . hydrALAZINE  25 mg Oral Q8H  . insulin aspart  0-9 Units Subcutaneous TID WC  . predniSONE  60 mg Oral Q breakfast  . sulfamethoxazole-trimethoprim  1 tablet Oral Daily  . tamsulosin  0.4 mg Oral Daily   acetaminophen **OR** acetaminophen, ondansetron **OR** ondansetron (ZOFRAN) IV, zolpidem  Assessment/ Plan:  1. Acute kidney Injury: Nonoliguric.  Unknown renal baseline.  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  In spite of his elevated creatinine, he had  limited scarring indicating salvageable renal tissue for which she was started on cyclophosphamide and corticosteroids. Process started for outpatient dialysis unit placement along with attempt to procure cyclophosphamide.  Another question may be as to whether outpatient rituximab infusions may be another option (especially with his current social situation).  Unclear if patient is going to recover at this point.   We will continue to follow renal panel.  We will continue to follow renal panel no dialysis scheduled. 2.  Hypertension: Blood pressure with intermittent elevations on amlodipine, continue to monitor with HD. 3.  Anemia: Iron stores are repleted.  He is on Aranesp. 4.  Hyperphosphatemia: Secondary to acute kidney injury and impaired phosphorus handling, monitor with HD. 5.  ANCA vasculitis MPO. It appears that patient is stabilizing in terms of his creatinine. I think it is reasonable to discharge patient on prednisone and Cytoxan   LOS: [place] @TODAY @9 :37 AM

## 2019-04-04 NOTE — Progress Notes (Signed)
Gabriel Harris to be discharged Home per MD order. Discussed prescriptions and follow up appointments with the patient. Prescriptions given to patient; medication list explained in detail. Patient verbalized understanding.  Skin clean, dry and intact without evidence of skin break down, no evidence of skin tears noted. IV catheter discontinued intact. Site without signs and symptoms of complications. Dressing and pressure applied. Pt denies pain at the site currently. No complaints noted.  Patient free of lines, drains, and wounds.   An After Visit Summary (AVS) was printed and given to the patient. Patient escorted via wheelchair, and discharged home via private auto.  [name], RN

## 2019-04-04 NOTE — Progress Notes (Signed)
Situation [facility] called  Regarding Medication Cyclophosphamide  [facility] states they will have  To order it as it is a special order medication.  Pharmasicist and MD consulted. Patient had called them stating  Pharmacy did not have medication.   After speaking to pharmacy MD ordered 5 days worth of doses from outpatient pharmacy [facility].    Script sent to [facility] per patient request.  Patient called,  aware, going to pick up medication.   Pharmacist  at [facility] called to confirm that he will order medication today, he stated that it would most likely be in Tuesday due to holiday.    Medication needs resolved.  Patient received what what needed.

## 2019-04-04 NOTE — Procedures (Signed)
Pre procedural Dx: AKI Post procedural Dx: Same  Successful removal of tunneled right IJ HD catheter  EBL: None No immediate complications.  Hemostasis achieved with manual pressure. 4x4 + tegaderm applied - dressing to remain x 24H, may shower after dressing removed.   Please see imaging section of Epic for full dictation.  Joaquim Nam PA-C 04/04/2019 3:13 PM

## 2019-04-04 NOTE — TOC Transition Note (Signed)
Transition of Care [facility]) - CM/SW Discharge Note   Patient Details  Name: Gabriel Harris MRN: [id number] Date of Birth: September 08, 1960  Transition of Care [facility]) CM/SW Contact:  Verdell Carmine, RN Phone Number: 04/04/2019, 1:40 PM   Clinical Narrative:    Patient is to be discharged today. Has good rx coupons for medications. Follow up at renal clinic HD catheter being removed by IR. Paperwork filled out and faxed for medication assistance, patient has a copy.Marland Kitchen Referral to vasculitis foundation for information and networking in discharge paperwork. Medications(except Cytoxan) will be obtained from [facility] [employer]. Instructed again of the importance of daily cytoxan and prednisone.    Final next level of care: Other (comment)(Discussed shelter resources with patient, however patient was not receptive to these options) Barriers to Discharge: Other (comment)(Patient is homeless and will be discharged today) Patient will be living with sister in [place]. Community clinic closed today. Number given in discharge paperwork to make appointment for PCP establishment.   Patient Goals and CMS Choice     Choice offered to / list presented to : NA  Discharge Placement  Live with Family                     Discharge Plan and Services In-house Referral: Clinical Social Work(CSW was informed that patient needed to talk with a case worker)  Live with family with renal  clinic follow up                                 Social Determinants of Health (SDOH) Interventions     Readmission Risk Interventions No flowsheet data found.

## 2019-04-04 NOTE — Discharge Summary (Signed)
Physician Discharge Summary  Gabriel Harris GLO:[id number] DOB: 05-31-1960 DOA: 03/10/2019  PCP: Gabriel Harris, No Pcp Per  Admit date: 03/10/2019 Discharge date: 04/04/2019  Admitted From: home Disposition:  home  Recommendations for Outpatient Follow-up:  1. Follow up with PCP in 1-2 weeks 2. Please obtain BMP/CBC in one week 3. Please follow up on the following pending results:  Home Health:no  Equipment/Devices: none  Discharge Condition: Stable Code Status: FULL Diet recommendation: Heart Healthy,renal  Brief/Interim Summary:  Gabriel Harris is a 59 year old male with history of tobacco abuse who presented to the emergency department with 2 weeks history of worsening dizziness, nausea, tinnitus, diplopia, diarrhea, vomiting.  On presentation, MRI was negative for any acute intracranial findings but he was found to have severe AKI with creatinine greater than 10.  Started on IV fluids, nephrology consulted.  Started on hemodialysis.  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 He has been started  immunosuppressants, Rituxan prednisone and Bactrim.  Need dialysis initially but at this time not needing dialysis, seen by nephrology this morning unclear if Gabriel Harris is going to recover at this point or not however since no more dialysis recently kidney doctor was okay with pulling the line out and discharging him home today and they will follow-up outpatient lab and to continue on current prednisone and Cytoxan therapy.  Case manager helped with medication for Cytoxan. Per nephrology  " ANCA vasculitis MPO. It appears that Gabriel Harris is stabilizing in terms of his creatinine. 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 He has been started  on immunosuppressants.  Has not needed dialysis for some  time, he will continue on  prednisone 60 mg daily, Cytoxan 125 mg daily, bactrim.   Discussed with nephrology okay for discharge home today after removal of dialysis catheter-they will arrange for outpatient follow-up of his renal function.  Gabriel Harris will follow up with vasculitis clinic.  Hyperkalemia: Resolved with Lokelma and with dialysis  Hypertension: BP borderline controlled, continue amlodipine and  Hydralazine.  Monitor and adjust meds.  Vertigo/diplopia: Overall improved.  MRI did not show any acute findings.  Chronic normocytic anemia:  Associated with CKD.  Continue ESA.  So far stable.  Leukocytosis: Suspected to be reactive,associated with steroids.resolved  Sinus bradycardia: Stable  Tobacco use: Cessation advised.  He says he smokes 2 to 3 packs of cigarettes a day.  Low albumin : Likely in the setting of nephrotic syndrome/CKD.  Consults: Nephrology  Subjective: Resting well no nausea vomiting fever chills.  Wants to go home. Was able to afford cyclophosphamide encasement has arranged [facility] with some co-pays.  Discharge Exam: Vitals:   04/04/19 0634 04/04/19 0947  BP: (!) 156/87 129/71  Pulse: 70 79  Resp: 18 18  Temp: 98.1 F (36.7 C) 98.6 F (37 C)  SpO2: 97% 95%   General: Pt is alert, awake, not in acute distress Cardiovascular: RRR, S1/S2 +, no rubs, no gallops Respiratory: CTA bilaterally, no wheezing, no rhonchi Abdominal: Soft, NT, ND, bowel sounds + Extremities: no edema, no cyanosis  Discharge Instructions  Discharge Instructions    Diet - low sodium heart healthy   Complete by: As directed    Discharge instructions   Complete by: As directed    Please call the current kidney phone number to have her follow-up as soon  as possible to monitor her renal function  Please follow-up with primary care doctor.  Please do not stop taking your medication including steroids without doctor's advice as you may need  tapering.  Please call call MD or return to ER for similar or worsening recurring problem that brought you to hospital or if any fever,nausea/vomiting,abdominal pain, uncontrolled pain, chest pain,  shortness of breath or any other alarming symptoms.  Please follow-up your doctor as instructed in a week time and call the office for appointment.  Please avoid alcohol, smoking, or any other illicit substance and maintain healthy habits including taking your regular medications as prescribed.  You were cared for by a hospitalist during your hospital stay. If you have any questions about your discharge medications or the care you received while you were in the hospital after you are discharged, you can call the unit and ask to speak with the hospitalist on call if the hospitalist that took care of you is not available.  Once you are discharged, your primary care physician will handle any further medical issues. Please note that NO REFILLS for any discharge medications will be authorized once you are discharged, as it is imperative that you return to your primary care physician (or establish a relationship with a primary care physician if you do not have one) for your aftercare needs so that they can reassess your need for medications and monitor your lab values   Increase activity slowly   Complete by: As directed      Allergies as of 04/04/2019   No Known Allergies     Medication List    TAKE these medications   amLODipine 10 MG tablet Commonly known as: NORVASC Take 1 tablet (10 mg total) by mouth daily. Start taking on: April 05, 2019   cyclophosphamide 25 MG capsule Commonly known as: CYTOXAN Take 5 capsules (125 mg total) by mouth daily. Take with food to minimize GI upset. Take early in the day and maintain hydration. Start taking on: April 05, 2019   hydrALAZINE 25 MG tablet Commonly known as: APRESOLINE Take 1 tablet (25 mg total) by mouth every 8 (eight) hours.   predniSONE 20 MG  tablet Commonly known as: DELTASONE Take 3 tablets (60 mg total) by mouth daily with breakfast. Start taking on: April 05, 2019   sulfamethoxazole-trimethoprim 400-80 MG tablet Commonly known as: BACTRIM Take 1 tablet by mouth daily. Start taking on: April 05, 2019   tamsulosin 0.4 MG Caps capsule Commonly known as: FLOMAX Take 1 capsule (0.4 mg total) by mouth daily. Start taking on: April 05, 2019      Follow-up Information    [facility] Follow up.   Why: good information, networking and webinars/programs on vasculitis Contact information: [email or web address]       [place], [place]. Schedule an appointment as soon as possible for a visit on 04/04/2019.   Why: Call ASAP to make a appointment  Contact information: [place] [address] [phone]        Verdell Carmine, RN Follow up.        [place]. Schedule an appointment as soon as possible for a visit.   Why: call and ay you would like to make appointment in [facility] information: [address] [place] [address] [phone]         No Known Allergies  The results of significant diagnostics from this hospitalization (including imaging, microbiology, ancillary and laboratory) are  listed below for reference.    Microbiology: No results found for this or any previous visit (from the past 240 hour(s)).  Procedures/Studies: DG Ankle Complete Right  Result Date: 03/10/2019 CLINICAL DATA:  Progressive pain EXAM: RIGHT ANKLE - COMPLETE 3+ VIEW COMPARISON:  None. FINDINGS: Frontal, oblique, and lateral views were obtained. No evident fracture or joint effusion. No appreciable joint space narrowing or erosion. Ankle mortise appears intact. There are multiple foci of arterial vascular calcification. IMPRESSION: No evident fracture or appreciable arthropathy. Ankle mortise appears intact. There are foci of  arterial vascular calcification Electronically Signed   By: Lowella Grip III M.D.   On: 03/10/2019 08:35   CT CHEST WO CONTRAST  Result Date: 03/18/2019 CLINICAL DATA:  History of COPD assessing for lung involvement of ANCA. EXAM: CT CHEST WITHOUT CONTRAST TECHNIQUE: Multidetector CT imaging of the chest was performed following the standard protocol without IV contrast. COMPARISON:  None. FINDINGS: Cardiovascular: A right-sided venous catheter is in place. No significant vascular findings. Normal heart size. A trace amount of pleural fluid is seen. Marked severity coronary artery calcification is seen. Mediastinum/Nodes: Multiple small partially calcified pretracheal and bilateral hilar lymph nodes are seen. Lungs/Pleura: Mild atelectasis is seen within the bilateral lung bases. There are small bilateral pleural effusions. No pneumothorax is identified. Upper Abdomen: No acute abnormality. Musculoskeletal: No chest wall mass or suspicious bone lesions identified. IMPRESSION: 1. Small bilateral pleural effusions. 2. Mild bibasilar atelectasis. 3. Small partially calcified pretracheal and bilateral hilar lymph nodes. Electronically Signed   By: Virgina Norfolk M.D.   On: 03/18/2019 21:30   MR BRAIN WO CONTRAST  Result Date: 03/10/2019 CLINICAL DATA:  Subacute neuro deficit.  Diarrhea and vomiting EXAM: MRI HEAD WITHOUT CONTRAST TECHNIQUE: Multiplanar, multiecho pulse sequences of the brain and surrounding structures were obtained without intravenous contrast. COMPARISON:  CT head 11/30/2004 FINDINGS: Brain: Ventricle size and cerebral volume normal. Negative for acute infarct. Scattered small white matter hyperintensities bilaterally. Mild hyperintensity in the pons right greater than left. Negative for hemorrhage or mass. No midline shift. Vascular: Normal arterial flow voids Skull and upper cervical spine: No focal skeletal lesion. Sinuses/Orbits: Mild mucosal edema paranasal sinuses and mastoid sinus  bilaterally. Negative orbit Other: None IMPRESSION: No acute abnormality. Mild chronic microvascular ischemic change in the white matter. Electronically Signed   By: Franchot Gallo M.D.   On: 03/10/2019 14:04   US Renal  Result Date: 03/10/2019 CLINICAL DATA:  New onset renal failure. EXAM: RENAL / URINARY TRACT ULTRASOUND COMPLETE COMPARISON:  None. FINDINGS: Right Kidney: Renal measurements: 11.1 x 4.1 x 5.0 cm = volume: 120 mL. Increased parenchymal echogenicity. No mass, stone or hydronephrosis. Left Kidney: Renal measurements: 11.6 x 5.6 x 5.1 cm = volume: 172 mL. Increased parenchymal echogenicity. Medial midpole cyst measuring 1.7 x 1.3 x 1.4 cm. No other masses, no stones and no hydronephrosis. Bladder: Minimally distended.  Otherwise unremarkable. Other: None. IMPRESSION: 1. Bilateral increased renal parenchymal echogenicity consistent with medical renal disease. 2. No hydronephrosis.  No acute finding. 3. 1.7 cm left renal cyst. Electronically Signed   By: Lajean Manes M.D.   On: 03/10/2019 11:38   IR Fluoro Guide CV Line Right  Result Date: 03/12/2019 INDICATION: 59 year old with renal failure and starting hemodialysis. Hemodialysis catheter is needed. EXAM: FLUOROSCOPIC AND ULTRASOUND GUIDED PLACEMENT OF A TUNNELED DIALYSIS CATHETER Physician: Stephan Minister. Anselm Pancoast, MD MEDICATIONS: Ancef 2 g; The antibiotic was administered within an appropriate time interval prior to skin puncture. ANESTHESIA/SEDATION: Versed 1.0  mg IV; Fentanyl 50 mcg IV; Moderate Sedation Time:  20 minutes The Gabriel Harris was continuously monitored during the procedure by the interventional radiology nurse under my direct supervision. FLUOROSCOPY TIME:  Fluoroscopy Time: 48 seconds, 84.5 mGy COMPLICATIONS: None immediate. PROCEDURE: The procedure was explained to the Gabriel Harris. The risks and benefits of the procedure were discussed and the Gabriel Harris's questions were addressed. Informed consent was obtained from the Gabriel Harris. The Gabriel Harris was  placed supine on the interventional table. Ultrasound confirmed a patent right internal jugular vein. Ultrasound image was obtained for documentation. The right neck and chest was prepped and draped in a sterile fashion. The right neck was anesthetized with 1% lidocaine. Maximal barrier sterile technique was utilized including caps, mask, sterile gowns, sterile gloves, sterile drape, hand hygiene and skin antiseptic. A small incision was made with #11 blade scalpel. A 21 gauge needle directed into the right internal jugular vein vein with ultrasound guidance. A micropuncture dilator set was placed. A 23 cm tip to cuff Palindrome catheter was selected. The skin below the right clavicle was anesthetized and a small incision was made with an #11 blade scalpel. A subcutaneous tunnel was formed to the vein dermatotomy site. The catheter was brought through the tunnel. The vein dermatotomy site was dilated to accommodate a peel-away sheath. The catheter was placed through the peel-away sheath and directed into the central venous structures. The tip of the catheter was placed at the SVC and right atrium junction with fluoroscopy. Fluoroscopic images were obtained for documentation. Both lumens were found to aspirate and flush well. The proper amount of heparin was flushed in both lumens. The vein dermatotomy site was closed using a single layer of absorbable suture and Dermabond. Gel-Foam placed in the subcutaneous tract. The catheter was secured to the skin using Prolene suture. IMPRESSION: Successful placement of a right jugular tunneled dialysis catheter using ultrasound and fluoroscopic guidance. Electronically Signed   By: Markus Daft M.D.   On: 03/12/2019 18:06   IR US Guide Vasc Access Right  Result Date: 03/12/2019 INDICATION: 59 year old with renal failure and starting hemodialysis. Hemodialysis catheter is needed. EXAM: FLUOROSCOPIC AND ULTRASOUND GUIDED PLACEMENT OF A TUNNELED DIALYSIS CATHETER Physician:  Stephan Minister. Anselm Pancoast, MD MEDICATIONS: Ancef 2 g; The antibiotic was administered within an appropriate time interval prior to skin puncture. ANESTHESIA/SEDATION: Versed 1.0 mg IV; Fentanyl 50 mcg IV; Moderate Sedation Time:  20 minutes The Gabriel Harris was continuously monitored during the procedure by the interventional radiology nurse under my direct supervision. FLUOROSCOPY TIME:  Fluoroscopy Time: 48 seconds, 36.4 mGy COMPLICATIONS: None immediate. PROCEDURE: The procedure was explained to the Gabriel Harris. The risks and benefits of the procedure were discussed and the Gabriel Harris's questions were addressed. Informed consent was obtained from the Gabriel Harris. The Gabriel Harris was placed supine on the interventional table. Ultrasound confirmed a patent right internal jugular vein. Ultrasound image was obtained for documentation. The right neck and chest was prepped and draped in a sterile fashion. The right neck was anesthetized with 1% lidocaine. Maximal barrier sterile technique was utilized including caps, mask, sterile gowns, sterile gloves, sterile drape, hand hygiene and skin antiseptic. A small incision was made with #11 blade scalpel. A 21 gauge needle directed into the right internal jugular vein vein with ultrasound guidance. A micropuncture dilator set was placed. A 23 cm tip to cuff Palindrome catheter was selected. The skin below the right clavicle was anesthetized and a small incision was made with an #11 blade scalpel. A subcutaneous tunnel was formed to  the vein dermatotomy site. The catheter was brought through the tunnel. The vein dermatotomy site was dilated to accommodate a peel-away sheath. The catheter was placed through the peel-away sheath and directed into the central venous structures. The tip of the catheter was placed at the SVC and right atrium junction with fluoroscopy. Fluoroscopic images were obtained for documentation. Both lumens were found to aspirate and flush well. The proper amount of heparin was flushed  in both lumens. The vein dermatotomy site was closed using a single layer of absorbable suture and Dermabond. Gel-Foam placed in the subcutaneous tract. The catheter was secured to the skin using Prolene suture. IMPRESSION: Successful placement of a right jugular tunneled dialysis catheter using ultrasound and fluoroscopic guidance. Electronically Signed   By: Markus Daft M.D.   On: 03/12/2019 18:06   US BIOPSY (KIDNEY)  Result Date: 03/13/2019 INDICATION: Renal failure of uncertain etiology. Please perform ultrasound-guided random renal biopsy for tissue diagnostic purposes. EXAM: ULTRASOUND GUIDED RENAL BIOPSY COMPARISON:  Renal ultrasound-03/10/2019 MEDICATIONS: None. ANESTHESIA/SEDATION: Fentanyl 50 mcg IV; Versed 1 mg IV Total Moderate Sedation time: 10 minutes; The Gabriel Harris was continuously monitored during the procedure by the interventional radiology nurse under my direct supervision. COMPLICATIONS: None immediate. PROCEDURE: Informed written consent was obtained from the Gabriel Harris after a discussion of the risks, benefits and alternatives to treatment. The Gabriel Harris understands and consents the procedure. A timeout was performed prior to the initiation of the procedure. Ultrasound scanning was performed of the bilateral flanks. The inferior pole of the left kidney was selected for biopsy due to location and sonographic window. The procedure was planned. The operative site was prepped and draped in the usual sterile fashion. The overlying soft tissues were anesthetized with 1% lidocaine with epinephrine. A 17 gauge core needle biopsy device was advanced into the inferior cortex of the left kidney and 3 core biopsies were obtained under direct ultrasound guidance. Images were saved for documentation purposes. The biopsy device was removed and hemostasis was obtained with manual compression. Post procedural scanning was negative for significant post procedural hemorrhage or additional complication. A dressing  was placed. The Gabriel Harris tolerated the procedure well without immediate post procedural complication. IMPRESSION: Technically successful ultrasound guided left renal biopsy. Electronically Signed   By: Sandi Mariscal M.D.   On: 03/13/2019 09:36    Labs: BNP (last 3 results) No results for input(s): BNP in the last 8760 hours. Basic Metabolic Panel: Recent Labs  Lab 03/31/19 0530 04/01/19 0625 04/02/19 0447 04/03/19 0812 04/04/19 0441  NA 140 140 137 141 139  K 4.9 4.4 4.6 4.2 4.7  CL 105 101 103 103 104  CO2 21* 27 23 25 22   GLUCOSE 131* 88 80 97 85  BUN 64* 31* 45* 61* 64*  CREATININE 6.62* 4.32* 5.46* 6.79* 6.95*  CALCIUM 8.0* 7.8* 8.1* 8.3* 8.5*  PHOS 6.1* 5.7* 5.9* 5.2* 5.6*   Liver Function Tests: Recent Labs  Lab 03/31/19 0530 04/01/19 0625 04/02/19 0447 04/03/19 0812 04/04/19 0441  ALBUMIN 2.6* 2.6* 2.8* 2.9* 2.7*   No results for input(s): LIPASE, AMYLASE in the last 168 hours. No results for input(s): AMMONIA in the last 168 hours. CBC: Recent Labs  Lab 03/29/19 0402 04/03/19 0812 04/03/19 1540  WBC 10.6* 8.0 8.1  NEUTROABS 9.0*  --  6.6  HGB 9.7* 10.5* 10.7*  HCT 30.2* 33.4* 33.8*  MCV 94.7 99.1 98.8  PLT 180 184 195   Cardiac Enzymes: No results for input(s): CKTOTAL, CKMB, CKMBINDEX, TROPONINI in the  last 168 hours. BNP: Invalid input(s): POCBNP CBG: Recent Labs  Lab 04/03/19 1119 04/03/19 1718 04/03/19 2208 04/04/19 0633 04/04/19 1135  GLUCAP 91 152* 106* 91 98   D-Dimer No results for input(s): DDIMER in the last 72 hours. Hgb A1c No results for input(s): HGBA1C in the last 72 hours. Lipid Profile No results for input(s): CHOL, HDL, LDLCALC, TRIG, CHOLHDL, LDLDIRECT in the last 72 hours. Thyroid function studies No results for input(s): TSH, T4TOTAL, T3FREE, THYROIDAB in the last 72 hours.  Invalid input(s): FREET3 Anemia work up No results for input(s): VITAMINB12, FOLATE, FERRITIN, TIBC, IRON, RETICCTPCT in the last 72  hours. Urinalysis    Component Value Date/Time   COLORURINE YELLOW 03/10/2019 0933   APPEARANCEUR HAZY (A) 03/10/2019 0933   LABSPEC 1.010 03/10/2019 0933   PHURINE 5.0 03/10/2019 0933   GLUCOSEU NEGATIVE 03/10/2019 0933   HGBUR LARGE (A) 03/10/2019 0933   BILIRUBINUR NEGATIVE 03/10/2019 0933   KETONESUR NEGATIVE 03/10/2019 0933   PROTEINUR 100 (A) 03/10/2019 0933   NITRITE NEGATIVE 03/10/2019 0933   LEUKOCYTESUR NEGATIVE 03/10/2019 0933   Sepsis Labs Invalid input(s): PROCALCITONIN,  WBC,  LACTICIDVEN Microbiology No results found for this or any previous visit (from the past 240 hour(s)).   Time coordinating discharge: 35  minutes  SIGNED: Antonieta Pert, MD  [facility] 04/04/2019, 3:24 PM  If 7PM-7AM, please contact night-coverage [email or web address]

## 2019-05-07 ENCOUNTER — Ambulatory Visit: Payer: Self-pay | Attending: Family Medicine | Admitting: Family Medicine

## 2019-05-07 ENCOUNTER — Encounter: Payer: Self-pay | Admitting: Family Medicine

## 2019-05-07 ENCOUNTER — Other Ambulatory Visit: Payer: Self-pay

## 2019-05-07 DIAGNOSIS — N185 Chronic kidney disease, stage 5: Secondary | ICD-10-CM

## 2019-05-07 DIAGNOSIS — N179 Acute kidney failure, unspecified: Secondary | ICD-10-CM

## 2019-05-07 DIAGNOSIS — I10 Essential (primary) hypertension: Secondary | ICD-10-CM

## 2019-05-07 DIAGNOSIS — R42 Dizziness and giddiness: Secondary | ICD-10-CM

## 2019-05-07 NOTE — Progress Notes (Signed)
Virtual Visit via Telephone Note  I connected with Gabriel Harris, on 05/07/2019 at 1:36 PM by telephone due to the COVID-19 pandemic and verified that I am speaking with the correct person using two identifiers.   Consent: I discussed the limitations, risks, security and privacy concerns of performing an evaluation and management service by telephone and the availability of in person appointments. I also discussed with the patient that there may be a patient responsible charge related to this service. The patient expressed understanding and agreed to proceed.   Location of Patient: Environmental education officer of Provider: Clinic   Persons participating in Telemedicine visit: Leviathan Macera Farrington-CMA Dr. Margarita Rana     History of Present Illness: 59 year old male who presents today to establish care.  He was hospitalized at [facility] from 03/10/2019 through 04/04/2019 for acute kidney failure.  He had presented with dizziness, diarrhea and vomiting and was found to have severe acute kidney injury with creatinine of greater than 10.  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 He was commenced on hemodialysis, immunosuppressants, prednisone. Discharged on Cytoxan before discharge creatinine of 6.95.  He saw his Nephrologist 05/05/19 and has had recent lab. States he is doing 'so so' as he does have dizziness. His Nephrologist states his dizziness is from his medication. He has not needed any more dialysis sessions but states his nephrologist has informed him he is not out of the woods yet.  He is unsure of his medications but states he is compliant with all that has been prescribed by nephrology. He has headaches occur once in a blue moon and he tells me "forget about that, it does not bother me". Smokes 1 pack every other day and he does not want to quit  We have discussed referral for colonoscopy which he declines No past medical history on file. No Known  Allergies  Current Outpatient Medications on File Prior to Visit  Medication Sig Dispense Refill  . cyclophosphamide (CYTOXAN) 25 MG capsule Take 5 capsules (125 mg total) by mouth daily. Take with food, drink enough water. 150 capsule 1  . hydrALAZINE (APRESOLINE) 25 MG tablet Take 1 tablet (25 mg total) by mouth every 8 (eight) hours. 90 tablet 1  . tamsulosin (FLOMAX) 0.4 MG CAPS capsule Take 1 capsule (0.4 mg total) by mouth daily. 60 capsule 0  . amLODipine (NORVASC) 10 MG tablet Take 1 tablet (10 mg total) by mouth daily. (Patient not taking: Reported on 05/07/2019) 30 tablet 1  . predniSONE (DELTASONE) 20 MG tablet Take 3 tablets (60 mg total) by mouth daily with breakfast. (Patient not taking: Reported on 05/07/2019) 90 tablet 1  . sulfamethoxazole-trimethoprim (BACTRIM) 400-80 MG tablet Take 1 tablet by mouth daily. (Patient not taking: Reported on 05/07/2019) 30 tablet 1   No current facility-administered medications on file prior to visit.    Observations/Objective: Awake, alert, oriented x3 Not in acute distress  CMP Latest Ref Rng & Units 04/04/2019 04/03/2019 04/02/2019  Glucose 70 - 99 mg/dL 85 97 80  BUN 6 - 20 mg/dL 64(H) 61(H) 45(H)  Creatinine 0.61 - 1.24 mg/dL 6.95(H) 6.79(H) 5.46(H)  Sodium 135 - 145 mmol/L 139 141 137  Potassium 3.5 - 5.1 mmol/L 4.7 4.2 4.6  Chloride 98 - 111 mmol/L 104 103 103  CO2 22 - 32 mmol/L 22 25 23   Calcium 8.9 - 10.3 mg/dL 8.5(L) 8.3(L) 8.1(L)  Total Protein 6.5 - 8.1 g/dL - - -  Total Bilirubin 0.3 - 1.2 mg/dL - - -  Alkaline Phos 38 - 126 U/L - - -  AST 15 - 41 U/L - - -  ALT 0 - 44 U/L - - -     Assessment and Plan: 1. 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 Dizziness Secondary to Cytoxan as per Nephrology Change  positions slowly  3. Essential hypertension Currently on amlodipine and Hydralazine Counseled on blood pressure goal of less than 130/80, low-sodium, DASH diet, medication compliance, 150 minutes of moderate intensity exercise per week. Discussed medication compliance, adverse effects.   4.  Tobacco abuse Spent 3 minutes counseling on cessation and he is not ready to quit   Healthcare maintenance-he is not interested at this time    Follow Up Instructions: Return if symptoms worsen or fail to improve.    I discussed the assessment and treatment plan with the patient. The patient was provided an opportunity to ask questions and all were answered. The patient agreed with the plan and demonstrated an understanding of the instructions.   The patient was advised to call back or seek an in-person evaluation if the symptoms worsen or if the condition fails to improve as anticipated.     I provided 14 minutes total of non-face-to-face time during this encounter including median intraservice time, reviewing previous notes, investigations, ordering medications, medical decision making, coordinating care and patient verbalized understanding at the end of the visit.     Charlott Rakes, MD, FAAFP. [facility] [place], [place]   05/07/2019, 1:36 PM

## 2019-05-07 NOTE — Progress Notes (Signed)
Dizziness and headaches.

## 2019-06-04 ENCOUNTER — Emergency Department (HOSPITAL_COMMUNITY)
Admission: EM | Admit: 2019-06-04 | Discharge: 2019-06-04 | Disposition: A | Discharge status: Home / Self Care | Payer: Medicaid Other | Attending: Emergency Medicine | Admitting: Emergency Medicine

## 2019-06-04 ENCOUNTER — Encounter (HOSPITAL_COMMUNITY): Payer: Self-pay | Admitting: Emergency Medicine

## 2019-06-04 DIAGNOSIS — R42 Dizziness and giddiness: Secondary | ICD-10-CM | POA: Diagnosis not present

## 2019-06-04 DIAGNOSIS — Z79899 Other long term (current) drug therapy: Secondary | ICD-10-CM | POA: Insufficient documentation

## 2019-06-04 DIAGNOSIS — F17210 Nicotine dependence, cigarettes, uncomplicated: Secondary | ICD-10-CM | POA: Diagnosis not present

## 2019-06-04 HISTORY — DX: Disorder of kidney and ureter, unspecified: N28.9

## 2019-06-04 LAB — URINALYSIS, ROUTINE W REFLEX MICROSCOPIC
Bilirubin Urine: NEGATIVE
Glucose, UA: NEGATIVE mg/dL
Ketones, ur: NEGATIVE mg/dL
Leukocytes,Ua: NEGATIVE
Nitrite: NEGATIVE
Protein, ur: 100 mg/dL — AB
Specific Gravity, Urine: 1.011 (ref 1.005–1.030)
pH: 5 (ref 5.0–8.0)

## 2019-06-04 LAB — BASIC METABOLIC PANEL
Anion gap: 11 (ref 5–15)
BUN: 36 mg/dL — ABNORMAL HIGH (ref 6–20)
CO2: 22 mmol/L (ref 22–32)
Calcium: 8.2 mg/dL — ABNORMAL LOW (ref 8.9–10.3)
Chloride: 103 mmol/L (ref 98–111)
Creatinine, Ser: 3 mg/dL — ABNORMAL HIGH (ref 0.61–1.24)
GFR calc Af Amer: 25 mL/min — ABNORMAL LOW (ref >60)
GFR calc non Af Amer: 22 mL/min — ABNORMAL LOW (ref >60)
Glucose, Bld: 85 mg/dL (ref 70–99)
Potassium: 4.4 mmol/L (ref 3.5–5.1)
Sodium: 136 mmol/L (ref 135–145)

## 2019-06-04 LAB — CBC
HCT: 35.3 % — ABNORMAL LOW (ref 39.0–52.0)
Hemoglobin: 11.3 g/dL — ABNORMAL LOW (ref 13.0–17.0)
MCH: 31.8 pg (ref 26.0–34.0)
MCHC: 32 g/dL (ref 30.0–36.0)
MCV: 99.4 fL (ref 80.0–100.0)
Platelets: 196 10*3/uL (ref 150–400)
RBC: 3.55 MIL/uL — ABNORMAL LOW (ref 4.22–5.81)
RDW: 15.9 % — ABNORMAL HIGH (ref 11.5–15.5)
WBC: 6.6 10*3/uL (ref 4.0–10.5)
nRBC: 0 % (ref 0.0–0.2)

## 2019-06-04 MED ORDER — CYCLOPHOSPHAMIDE 50 MG PO CAPS: 100.0000 mg | ORAL_CAPSULE | Freq: Every day | ORAL | 1 refills | Status: AC

## 2019-06-04 MED ORDER — CYCLOPHOSPHAMIDE 25 MG PO CAPS: 125.0000 mg | ORAL_CAPSULE | Freq: Every day | ORAL | 1 refills | Status: DC

## 2019-06-04 MED ORDER — AMLODIPINE BESYLATE 10 MG PO TABS: 10.0000 mg | ORAL_TABLET | Freq: Every day | ORAL | 1 refills | Status: AC

## 2019-06-04 MED ORDER — SODIUM CHLORIDE 0.9 % IV BOLUS
1000.0000 mL | Freq: Once | INTRAVENOUS | Status: AC
  Administered 2019-06-04: 1000 mL via INTRAVENOUS

## 2019-06-04 MED ORDER — SODIUM CHLORIDE 0.9% FLUSH
3.0000 mL | Freq: Once | INTRAVENOUS | Status: AC
  Administered 2019-06-04: 3 mL via INTRAVENOUS

## 2019-06-04 MED ORDER — HYDRALAZINE HCL 25 MG PO TABS: 25.0000 mg | ORAL_TABLET | Freq: Three times a day (TID) | ORAL | 1 refills | Status: AC

## 2019-06-04 MED FILL — hydrALAZINE HCL 25 MG TABS: 25 | 30 days supply | Qty: 90 | Fill #0

## 2019-06-04 MED FILL — CYCLOPHOSPHAMIDE 50 MG CAPS: 50 | 30 days supply | Qty: 60 | Fill #0

## 2019-06-04 MED FILL — AMLODIPINE BESYLATE 10 MG T: 10 | 30 days supply | Qty: 30 | Fill #0

## 2019-06-04 NOTE — ED Triage Notes (Signed)
Pt states he was diagnosed with kidney failure about 3 months ago and was in the hospital. Pt states he has been chronically dizziness even before leaving the hospital and this has not gotten any better, Pt reports multiple falls at home, did have a follow up apt yesterday but did not go. Pt also has mid back pain.

## 2019-06-04 NOTE — Discharge Instructions (Addendum)
As discussed, your evaluation today has been largely reassuring.  But, it is important that you monitor your condition carefully, and do not hesitate to return to the ED if you develop new, or concerning changes in your condition.  Otherwise, please follow-up with your physician for appropriate ongoing care.  It is very important to take all medication as directed.

## 2019-06-04 NOTE — ED Provider Notes (Signed)
[facility] EMERGENCY DEPARTMENT Provider Note   CSN: [id number] Arrival date & time: 06/04/19  1224     History Chief Complaint  Patient presents with  . Dizziness    Gabriel Harris is a 59 y.o. male.  HPI    Patient presents with concern of dizziness, flank pain. Patient notes that he has had the symptoms for about 3 months, including during evaluation at this facility. He notes during that hospitalization he was diagnosed with kidney failure.  He was transiently on dialysis, but was discharged with ongoing outpatient medication therapy.  He has since stopped taking this medication, has become homeless. He denies substantial changes in the dizziness, described as unsteadiness, or flank pain. No new fever, inability to urinate, other pain, confusion. With inability to obtain his medication, and homelessness who presents for evaluation.  Past Medical History:  Diagnosis Date  . Kidney disease     Patient Active Problem List   Diagnosis Date Noted  . AKI (acute kidney injury) ([place]) 03/10/2019  . Hyperkalemia 03/10/2019  . Normocytic anemia 03/10/2019  . Hypoalbuminemia 03/10/2019  . Sinus bradycardia 03/10/2019  . Tobacco use 03/10/2019  . Vertigo 03/10/2019  . Diplopia 03/10/2019    Past Surgical History:  Procedure Laterality Date  . IR FLUORO GUIDE CV LINE RIGHT  03/12/2019  . IR REMOVAL TUN CV CATH W/O FL  04/04/2019  . IR US GUIDE VASC ACCESS RIGHT  03/12/2019       Family History  Problem Relation Age of Onset  . Hypertension Mother   . Diabetes Mellitus II Mother   . Hypertension Father   . Diabetes Mellitus II Father   . Diabetes Mellitus II Sister     Social History   Tobacco Use  . Smoking status: Current Every Day Smoker    Packs/day: 0.25    Types: Cigarettes  . Smokeless tobacco: Never Used  Substance Use Topics  . Alcohol use: Not Currently  . Drug use: Never    Home Medications Prior to Admission medications     Medication Sig Start Date End Date Taking? Authorizing Provider  amLODipine (NORVASC) 10 MG tablet Take 1 tablet (10 mg total) by mouth daily. 06/04/19 08/03/19  Carmin Muskrat, MD  cyclophosphamide (CYTOXAN) 50 MG capsule Take 2 capsules (100 mg total) by mouth daily. Take with food, drink enough water. 06/04/19 08/03/19  Carmin Muskrat, MD  hydrALAZINE (APRESOLINE) 25 MG tablet Take 1 tablet (25 mg total) by mouth every 8 (eight) hours. 06/04/19 08/03/19  Carmin Muskrat, MD  predniSONE (DELTASONE) 20 MG tablet Take 3 tablets (60 mg total) by mouth daily with breakfast. Patient not taking: Reported on 05/07/2019 04/05/19 06/04/19  Antonieta Pert, MD  sulfamethoxazole-trimethoprim (BACTRIM) 400-80 MG tablet Take 1 tablet by mouth daily. Patient not taking: Reported on 05/07/2019 04/05/19 06/04/19  Antonieta Pert, MD  tamsulosin (FLOMAX) 0.4 MG CAPS capsule Take 1 capsule (0.4 mg total) by mouth daily. 04/05/19 06/04/19  Antonieta Pert, MD    Allergies    Patient has no known allergies.  Review of Systems   Review of Systems  Constitutional:       Per HPI, otherwise negative  HENT:       Per HPI, otherwise negative  Respiratory:       Per HPI, otherwise negative  Cardiovascular:       Per HPI, otherwise negative  Gastrointestinal: Negative for vomiting.  Endocrine:       Negative aside from HPI  Genitourinary:  Neg aside from HPI   Musculoskeletal:       Per HPI, otherwise negative  Skin: Negative.   Neurological: Positive for dizziness and light-headedness. Negative for syncope.    Physical Exam Updated Vital Signs BP (!) 142/115 (BP Location: Left Arm)   Pulse (!) 58   Temp 98.3 F (36.8 C) (Oral)   Resp 17   Ht 5\' 3"  (1.6 m)   Wt 65.8 kg   SpO2 97%   BMI 25.69 kg/m   Physical Exam Vitals and nursing note reviewed.  Constitutional:      General: He is not in acute distress.    Appearance: He is well-developed.  HENT:     Head: Normocephalic and atraumatic.  Eyes:     Conjunctiva/sclera:  Conjunctivae normal.  Cardiovascular:     Rate and Rhythm: Normal rate and regular rhythm.  Pulmonary:     Effort: Pulmonary effort is normal. No respiratory distress.     Breath sounds: No stridor.  Abdominal:     General: There is no distension.  Skin:    General: Skin is warm and dry.  Neurological:     Mental Status: He is alert and oriented to person, place, and time.     Cranial Nerves: No cranial nerve deficit.     Motor: No weakness.     Coordination: Coordination normal.  Psychiatric:        Mood and Affect: Mood normal.     ED Results / Procedures / Treatments   Labs (all labs ordered are listed, but only abnormal results are displayed) Labs Reviewed  BASIC METABOLIC PANEL - Abnormal; Notable for the following components:      Result Value   BUN 36 (*)    Creatinine, Ser 3.00 (*)    Calcium 8.2 (*)    GFR calc non Af Amer 22 (*)    GFR calc Af Amer 25 (*)    All other components within normal limits  CBC - Abnormal; Notable for the following components:   RBC 3.55 (*)    Hemoglobin 11.3 (*)    HCT 35.3 (*)    RDW 15.9 (*)    All other components within normal limits  URINALYSIS, ROUTINE W REFLEX MICROSCOPIC - Abnormal; Notable for the following components:   APPearance HAZY (*)    Hgb urine dipstick LARGE (*)    Protein, ur 100 (*)    Bacteria, UA RARE (*)    All other components within normal limits  CBG MONITORING, ED     Procedures Procedures (including critical care time)  Medications Ordered in ED Medications  sodium chloride flush (NS) 0.9 % injection 3 mL (has no administration in time range)  sodium chloride 0.9 % bolus 1,000 mL (has no administration in time range)    ED Course  I have reviewed the triage vital signs and the nursing notes.  Pertinent labs & imaging results that were available during my care of the patient were reviewed by me and considered in my medical decision making (see chart for details).     The initial  evaluation I read the patient chart clear documentation from hospitalization with kidney failure.  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  After the initial evaluation, patient is hemodynamically stable, labs reviewed, discussed, no evidence for worsening renal function, and substantial improvement compared to baseline from a few weeks ago.  The patient has received additional IV  fluids for concern for dehydration, but given this improvement, his denial of any progression of disease, the patient will require initiation of his medications again. I discussed his case with our case management colleagues and pharmacist to facilitate this.  Absent other complaints, with hemodynamic stability, the patient is appropriate for discharge, encouraged to follow-up with his nephrologist.    Final Clinical Impression(s) / ED Diagnoses Final diagnoses:  Dizziness    Rx / DC Orders ED Discharge Orders         Ordered    hydrALAZINE (APRESOLINE) 25 MG tablet  Every 8 hours     06/04/19 1529    cyclophosphamide (CYTOXAN) 25 MG capsule  Daily,   Status:  Discontinued     06/04/19 1529    amLODipine (NORVASC) 10 MG tablet  Daily     06/04/19 1529    cyclophosphamide (CYTOXAN) 50 MG capsule  Daily     06/04/19 1558           Carmin Muskrat, MD 06/04/19 [phone]

## 2020-08-30 NOTE — Progress Notes (Signed)
NEUROLOGY CONSULTATION NOTE  Gabriel Harris MRN: [id number] DOB: 1960-01-31  Referring provider: Jen Mow, PA Primary care provider: No PCP - has upcoming appointment to establish care.  Reason for consult:  headache  Assessment/Plan:   New daily persistent headache - in a new onset headache of a smoker over 60, must rule out secondary intracranial etiology such as metastasis or aneurysm.  Does not seem consistent with possible temporal arteritis.  Patient has CKD.  Will see if there is a protocol to treat in order to use contrast.  Otherwise, MRI of brain without contrast and MRA of head. Start nortriptyline '25mg'$  at bedtime.  We can increase to '50mg'$  at bedtime in 6 weeks if needed Limit use of pain relievers to no more than 2 days out of week to prevent risk of rebound or medication-overuse headache. Keep headache diary Discussed smoking cessation Follow up 6 months.  Further recommendations pending results.   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  History supplemented by referring provider's note.  For several months, he also reports new headaches as well as fatigue, dizziness and polyarthralgia.    No preceding event such as new medication or head trauma.  "11"10, across forehead, pounding.  Dizziness/vertigo.  Photophobia, phonophobia, sometimes blurred vision.  No nausea, vomiting, numbness, unilateral weakness or autonomic symptoms.  Started sleeping more frequently.  Persistent and daily.  No known triggers.  Sleep helps minimally.  Not taking any analgesics.    Drinks 1 cup of coffee daily.  Stopped drinking daily Mt Dew a few months ago.  Drinks ginger ale daily.    No past history of headaches.  He is a cigarette smoker with no intention to quit.  Current NSAIDS/analgesics:  NSAIDs and Tylenol contraindicated due to his kidney  issues Current Antihypertensive medications:  amlodipine    MRI of brain on 03/10/2019 personally reviewed was unremarkable.   08/18/2020 LABS:  Na 140, K 5.7, Cl 104, Co2 27, glucose, BUN 36, Cr 2.44; WBC 8.3, HGB 16.6, HCT 49.7, PLT 194   PAST MEDICAL HISTORY: Past Medical History:  Diagnosis Date   Kidney disease     PAST SURGICAL HISTORY: Past Surgical History:  Procedure Laterality Date   IR FLUORO GUIDE CV LINE RIGHT  03/12/2019   IR REMOVAL TUN CV CATH W/O FL  04/04/2019   IR US GUIDE VASC ACCESS RIGHT  03/12/2019    MEDICATIONS: Current Outpatient Medications on File Prior to Visit  Medication Sig Dispense Refill   amLODipine (NORVASC) 10 MG tablet Take 1 tablet (10 mg total) by mouth daily. 30 tablet 1   hydrALAZINE (APRESOLINE) 25 MG tablet Take 1 tablet (25 mg total) by mouth every 8 (eight) hours. 90 tablet 1   No current facility-administered medications on file prior to visit.    ALLERGIES: No Known Allergies  FAMILY HISTORY: Family History  Problem Relation Age of Onset   Hypertension Mother    Diabetes Mellitus II Mother    Hypertension Father    Diabetes Mellitus II Father    Diabetes Mellitus II Sister     Objective:  Blood pressure 137/88, pulse (!) 52, height '5\' 4"'$  (1.626 m), weight 151 lb 6.4 oz (68.7 kg), SpO2 95 %. General: No acute distress.  Patient appears well-groomed.   Head:  Normocephalic/atraumatic Eyes:  fundi examined but not visualized Neck: supple, no paraspinal tenderness, full range of motion Back:  No paraspinal tenderness Heart: regular rate and rhythm Lungs: Clear to auscultation bilaterally. Vascular: No carotid bruits. Neurological Exam: Mental status: alert and oriented to person, place, and time, recent and remote memory intact, fund of knowledge intact, attention and concentration intact, speech fluent and not dysarthric, language intact. Cranial nerves: CN I: not tested CN II: pupils equal, round and reactive to light,  visual fields intact CN III, IV, VI:  full range of motion, no nystagmus, no ptosis CN V: facial sensation intact. CN VII: upper and lower face symmetric CN VIII: hearing intact CN IX, X: gag intact, uvula midline CN XI: sternocleidomastoid and trapezius muscles intact CN XII: tongue midline Bulk & Tone: normal, no fasciculations. Motor:  muscle strength 5/5 throughout Sensation:  Pinprick, temperature and vibratory sensation intact. Deep Tendon Reflexes:  2+ throughout,  toes downgoing.   Finger to nose testing:  Without dysmetria.   Heel to shin:  Without dysmetria.   Gait:  Normal station and stride.  Romberg negative.    Thank you for allowing me to take part in the care of this patient.  Metta Clines, DO  CC: Jen Mow, PA

## 2020-08-31 ENCOUNTER — Ambulatory Visit (INDEPENDENT_AMBULATORY_CARE_PROVIDER_SITE_OTHER): Payer: Medicaid Other | Admitting: Neurology

## 2020-08-31 ENCOUNTER — Other Ambulatory Visit (INDEPENDENT_AMBULATORY_CARE_PROVIDER_SITE_OTHER): Payer: Medicaid Other

## 2020-08-31 ENCOUNTER — Other Ambulatory Visit: Payer: Self-pay

## 2020-08-31 ENCOUNTER — Encounter: Payer: Self-pay | Admitting: Neurology

## 2020-08-31 VITALS — BP 137/88 | HR 52 | Ht 64.0 in | Wt 151.4 lb

## 2020-08-31 DIAGNOSIS — I776 Arteritis, unspecified: Secondary | ICD-10-CM

## 2020-08-31 DIAGNOSIS — G4452 New daily persistent headache (NDPH): Secondary | ICD-10-CM

## 2020-08-31 DIAGNOSIS — F17200 Nicotine dependence, unspecified, uncomplicated: Secondary | ICD-10-CM

## 2020-08-31 DIAGNOSIS — N189 Chronic kidney disease, unspecified: Secondary | ICD-10-CM

## 2020-08-31 DIAGNOSIS — I7782 Antineutrophilic cytoplasmic antibody (ANCA) vasculitis: Secondary | ICD-10-CM

## 2020-08-31 LAB — BASIC METABOLIC PANEL
BUN: 34 mg/dL — ABNORMAL HIGH (ref 6–23)
CO2: 27 mEq/L (ref 19–32)
Calcium: 9.5 mg/dL (ref 8.4–10.5)
Chloride: 105 mEq/L (ref 96–112)
Creatinine, Ser: 2.59 mg/dL — ABNORMAL HIGH (ref 0.40–1.50)
GFR: 26.14 mL/min — ABNORMAL LOW (ref >60.00)
Glucose, Bld: 86 mg/dL (ref 70–99)
Potassium: 4.7 mEq/L (ref 3.5–5.1)
Sodium: 138 mEq/L (ref 135–145)

## 2020-08-31 MED ORDER — NORTRIPTYLINE HCL 25 MG PO CAPS: 25.0000 mg | ORAL_CAPSULE | Freq: Every day | ORAL | 3 refills | Status: DC

## 2020-08-31 NOTE — Patient Instructions (Signed)
MRI of brain (will try to get contrast) and MRA of head Start nortriptyline '25mg'$  at bedtime.  We can increase dose in 6 weeks if needed (contact me if no improvement) Follow up 6 months.

## 2020-09-02 ENCOUNTER — Emergency Department (HOSPITAL_COMMUNITY)
Admission: EM | Admit: 2020-09-02 | Discharge: 2020-09-02 | Disposition: A | Discharge status: Home / Self Care | Payer: Medicaid Other | Attending: Emergency Medicine | Admitting: Emergency Medicine

## 2020-09-02 ENCOUNTER — Encounter (HOSPITAL_COMMUNITY): Payer: Self-pay

## 2020-09-02 ENCOUNTER — Emergency Department (HOSPITAL_COMMUNITY): Payer: Medicaid Other

## 2020-09-02 ENCOUNTER — Other Ambulatory Visit: Payer: Self-pay

## 2020-09-02 DIAGNOSIS — R519 Headache, unspecified: Secondary | ICD-10-CM | POA: Insufficient documentation

## 2020-09-02 DIAGNOSIS — M545 Low back pain, unspecified: Secondary | ICD-10-CM | POA: Diagnosis not present

## 2020-09-02 DIAGNOSIS — F17210 Nicotine dependence, cigarettes, uncomplicated: Secondary | ICD-10-CM | POA: Insufficient documentation

## 2020-09-02 DIAGNOSIS — R109 Unspecified abdominal pain: Secondary | ICD-10-CM | POA: Diagnosis not present

## 2020-09-02 DIAGNOSIS — Z79899 Other long term (current) drug therapy: Secondary | ICD-10-CM | POA: Diagnosis not present

## 2020-09-02 DIAGNOSIS — I10 Essential (primary) hypertension: Secondary | ICD-10-CM | POA: Insufficient documentation

## 2020-09-02 HISTORY — DX: Essential (primary) hypertension: I10

## 2020-09-02 LAB — CBC WITH DIFFERENTIAL/PLATELET
Abs Immature Granulocytes: 0.04 10*3/uL (ref 0.00–0.07)
Basophils Absolute: 0.1 10*3/uL (ref 0.0–0.1)
Basophils Relative: 1 %
Eosinophils Absolute: 0.3 10*3/uL (ref 0.0–0.5)
Eosinophils Relative: 3 %
HCT: 50.6 % (ref 39.0–52.0)
Hemoglobin: 16.4 g/dL (ref 13.0–17.0)
Immature Granulocytes: 0 %
Lymphocytes Relative: 17 %
Lymphs Abs: 1.7 10*3/uL (ref 0.7–4.0)
MCH: 30.3 pg (ref 26.0–34.0)
MCHC: 32.4 g/dL (ref 30.0–36.0)
MCV: 93.4 fL (ref 80.0–100.0)
Monocytes Absolute: 0.7 10*3/uL (ref 0.1–1.0)
Monocytes Relative: 7 %
Neutro Abs: 7.2 10*3/uL (ref 1.7–7.7)
Neutrophils Relative %: 72 %
Platelets: 232 10*3/uL (ref 150–400)
RBC: 5.42 MIL/uL (ref 4.22–5.81)
RDW: 12.8 % (ref 11.5–15.5)
WBC: 10 10*3/uL (ref 4.0–10.5)
nRBC: 0 % (ref 0.0–0.2)

## 2020-09-02 LAB — BASIC METABOLIC PANEL
Anion gap: 8 (ref 5–15)
BUN: 36 mg/dL — ABNORMAL HIGH (ref 6–20)
CO2: 26 mmol/L (ref 22–32)
Calcium: 9.4 mg/dL (ref 8.9–10.3)
Chloride: 110 mmol/L (ref 98–111)
Creatinine, Ser: 2.4 mg/dL — ABNORMAL HIGH (ref 0.61–1.24)
GFR, Estimated: 30 mL/min — ABNORMAL LOW (ref >60)
Glucose, Bld: 88 mg/dL (ref 70–99)
Potassium: 4.9 mmol/L (ref 3.5–5.1)
Sodium: 144 mmol/L (ref 135–145)

## 2020-09-02 LAB — URINALYSIS, ROUTINE W REFLEX MICROSCOPIC
Bacteria, UA: NONE SEEN
Bilirubin Urine: NEGATIVE
Glucose, UA: NEGATIVE mg/dL
Ketones, ur: NEGATIVE mg/dL
Leukocytes,Ua: NEGATIVE
Nitrite: NEGATIVE
Protein, ur: NEGATIVE mg/dL
Specific Gravity, Urine: 1.02 (ref 1.005–1.030)
pH: 6 (ref 5.0–8.0)

## 2020-09-02 IMAGING — CT CT HEAD W/O CM
3 series · 16 of 47 positions shown, 19 images · non-contrast
Comparison: [DATE]

CLINICAL DATA: Headache for 6 months, headache worsened by light

EXAM:
CT HEAD WITHOUT CONTRAST
TECHNIQUE: Contiguous axial images were obtained from the base of the skull
through the vertex without intravenous contrast. Sagittal and
coronal MPR images reconstructed from axial data set.

[Series 2: head wo · axial · 0.41mm/px · z∈[-73,+57]mm · 10 of 32 slices shown, 13 images]
[im 3/32  brain]
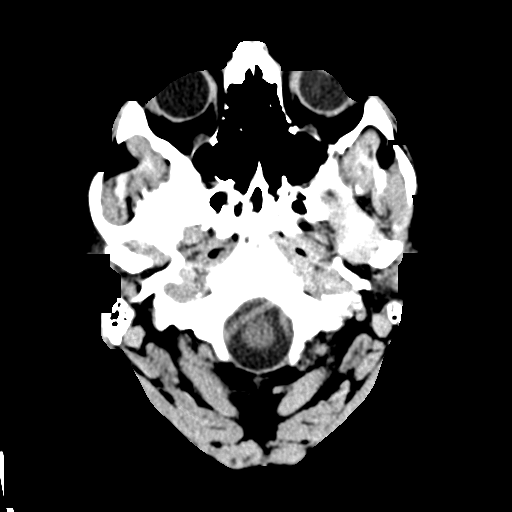
[im 3/32  bone]
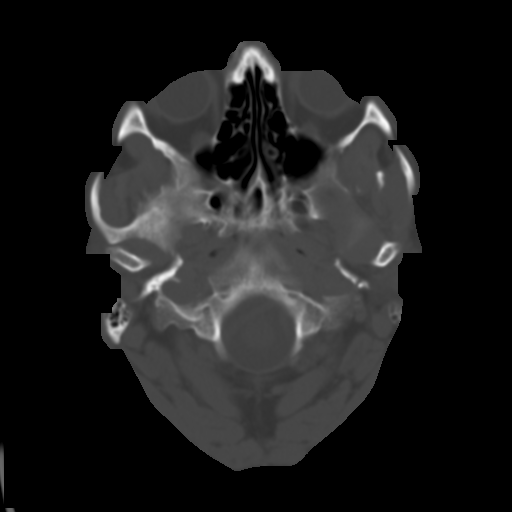
[im 6/32  brain]
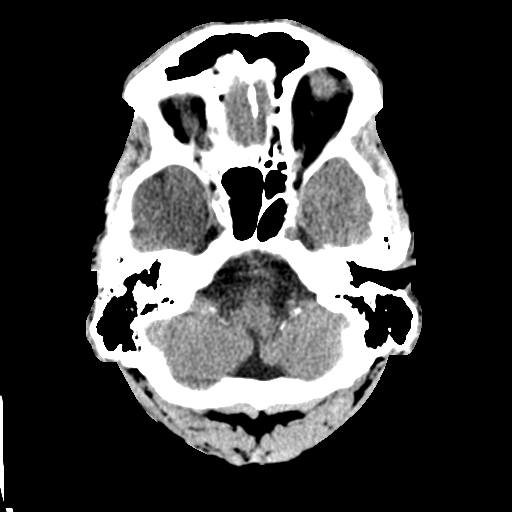
[im 9/32  brain]
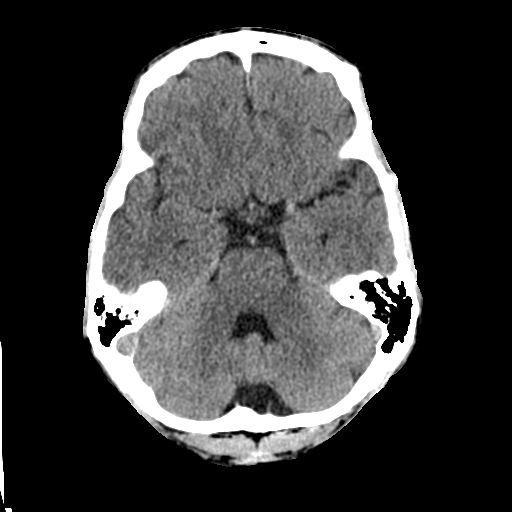
[im 11/32  brain]
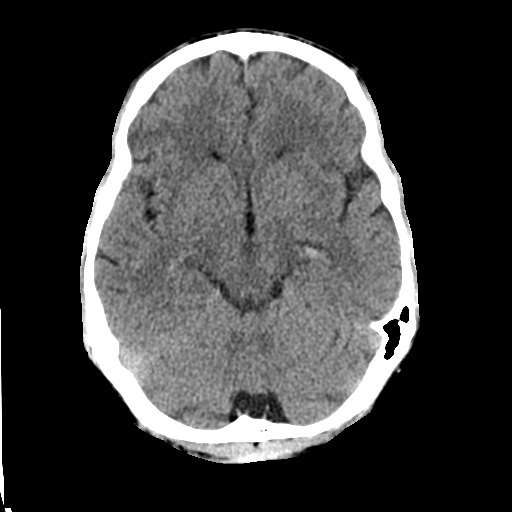
[im 14/32  brain]
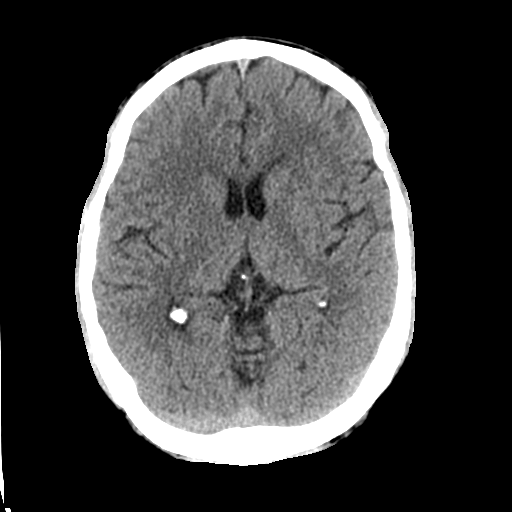
[im 14/32  bone]
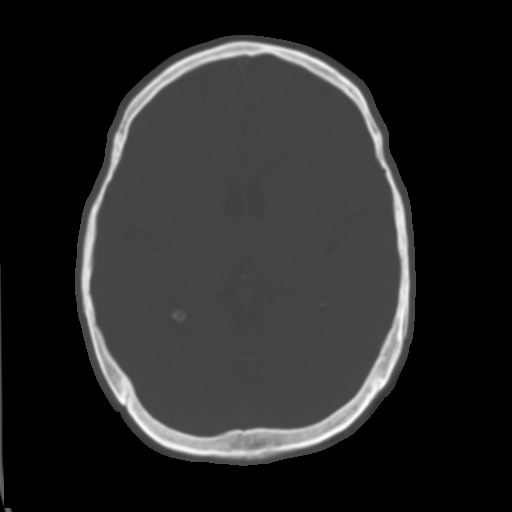
[im 18/32  brain]
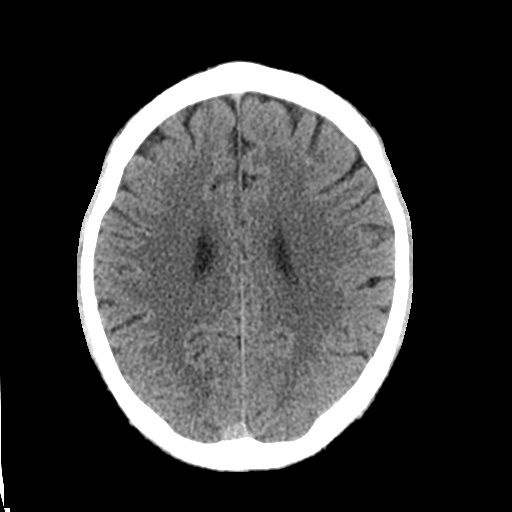
[im 21/32  brain]
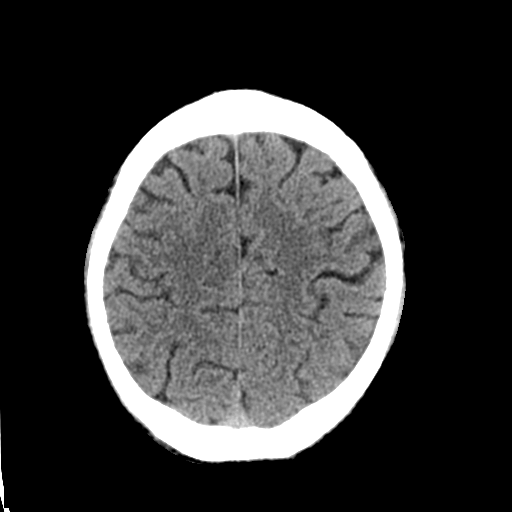
[im 24/32  brain]
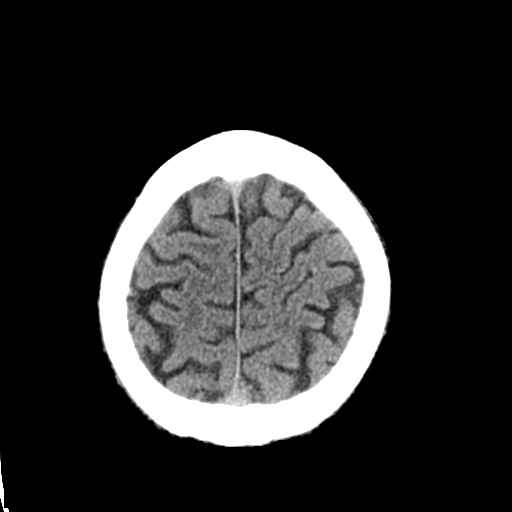
[im 26/32  brain]
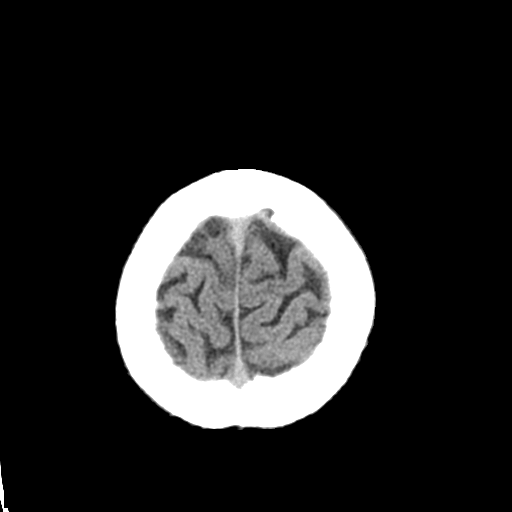
[im 26/32  bone]
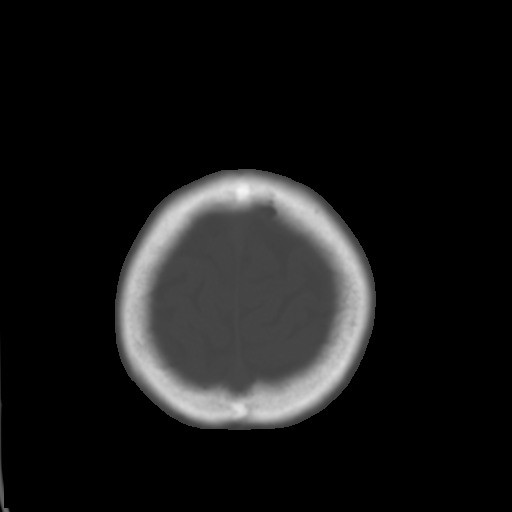
[im 29/32  brain]
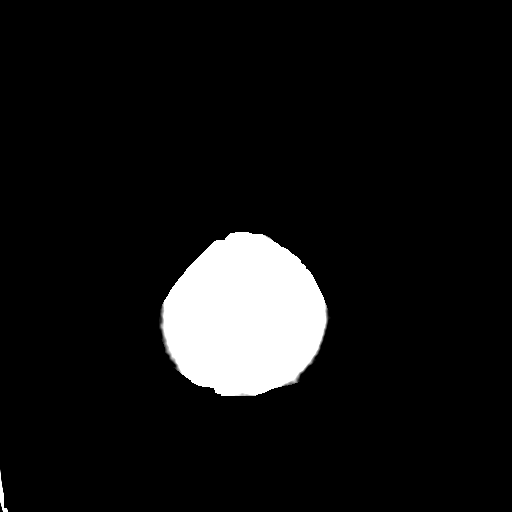

[Series 5: coronal soft tissue · coronal · 0.30mm/px · 3 of 63 slices shown]
[im 21/63  brain]
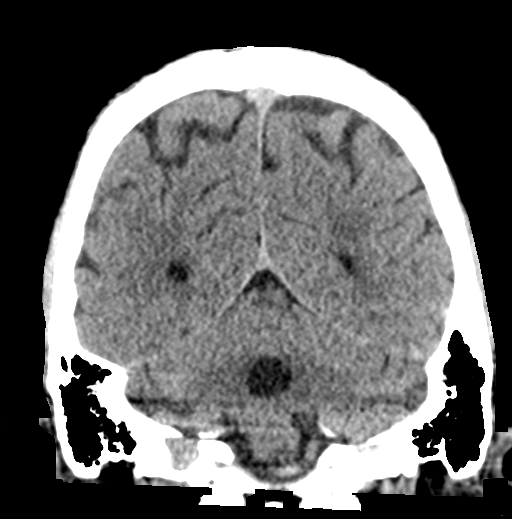
[im 28/63  brain]
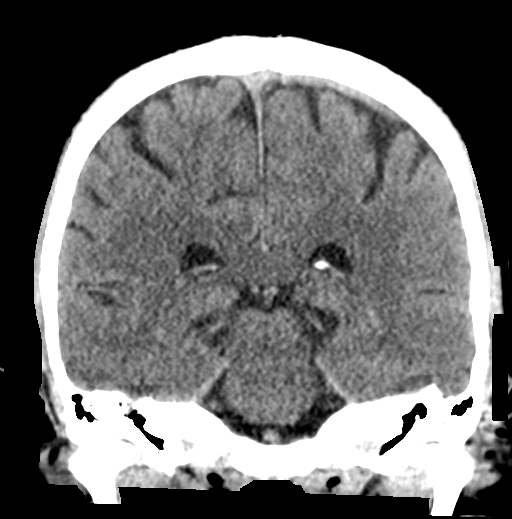
[im 35/63  brain]
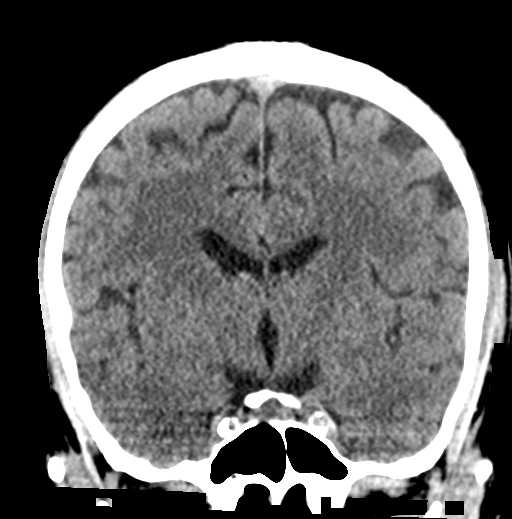

[Series 6: sagittal soft tissue · sagittal · 0.31mm/px · 3 of 52 slices shown]
[im 18/52  brain]
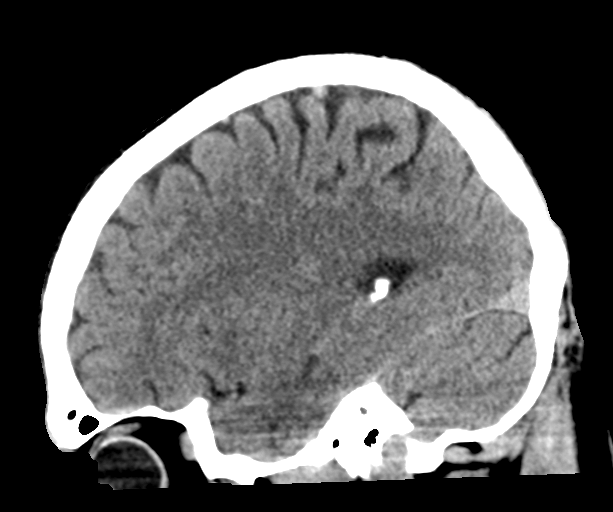
[im 26/52  brain]
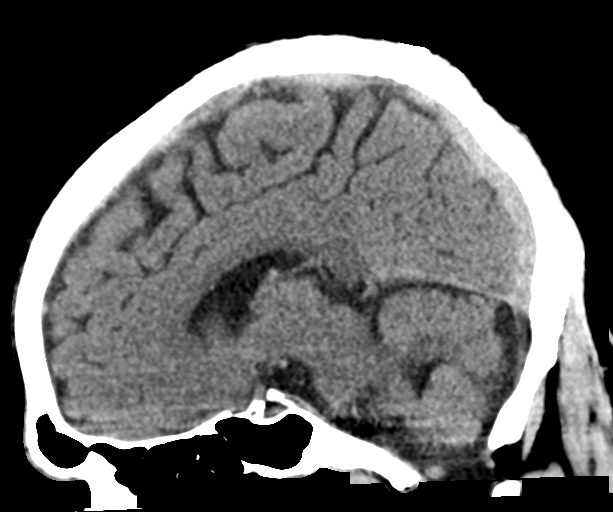
[im 35/52  brain]
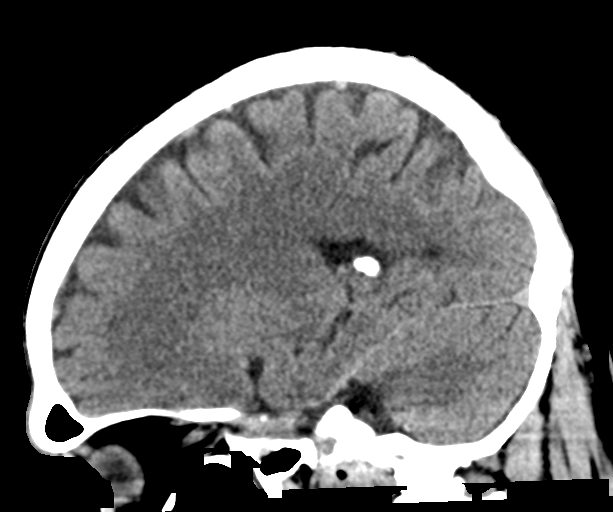

[16 of 47 positions shown; findings below may reference images not displayed]

FINDINGS: Brain: Normal ventricular morphology. No midline shift or mass
effect. Normal appearance of brain parenchyma. No intracranial
hemorrhage, mass lesion, evidence of acute infarction, or
extra-axial fluid collection.

Vascular: No hyperdense vessels. Atherosclerotic calcification of
internal carotid arteries bilaterally at skull base

Skull: Intact

Sinuses/Orbits: Clear

Other: N/A
IMPRESSION: No acute intracranial abnormalities.

## 2020-09-02 IMAGING — CT CT RENAL STONE PROTOCOL
2 of 4 series · 16 of 46 positions shown, 18 images · non-contrast
Comparison: None

CLINICAL DATA: RIGHT flank pain, urinary frequency, and urinary
odor for 3-4 weeks

EXAM:
CT ABDOMEN AND PELVIS WITHOUT CONTRAST
TECHNIQUE: Multidetector CT imaging of the abdomen and pelvis was performed
following the standard protocol without IV contrast. Sagittal and
coronal MPR images reconstructed from axial data set. No oral
contrast administered.

[Series 2: axial st · axial · 0.65mm/px · z∈[-827,-417]mm · 13 of 94 slices shown, 15 images]
[im 6/94  soft-tissue]
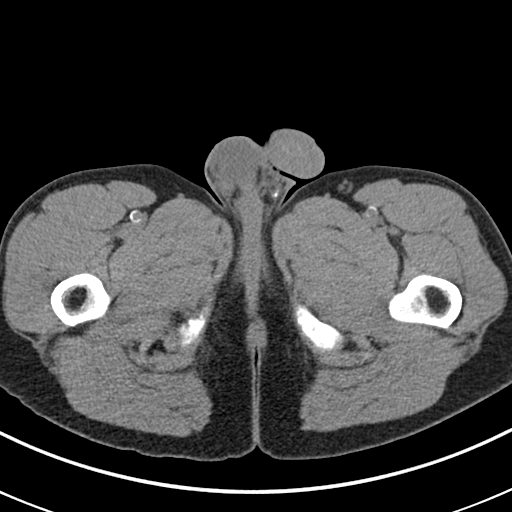
[im 6/94  bone]
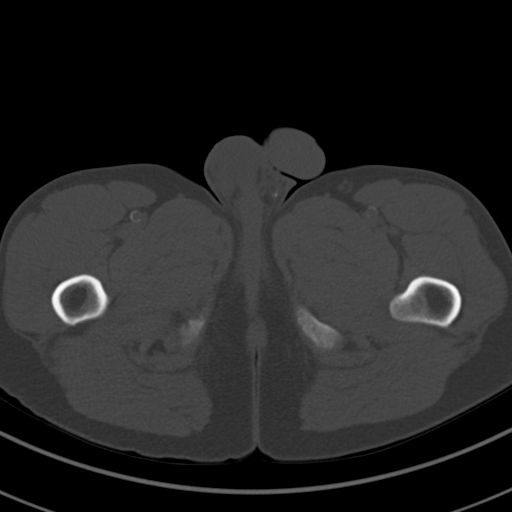
[im 11/94  soft-tissue]
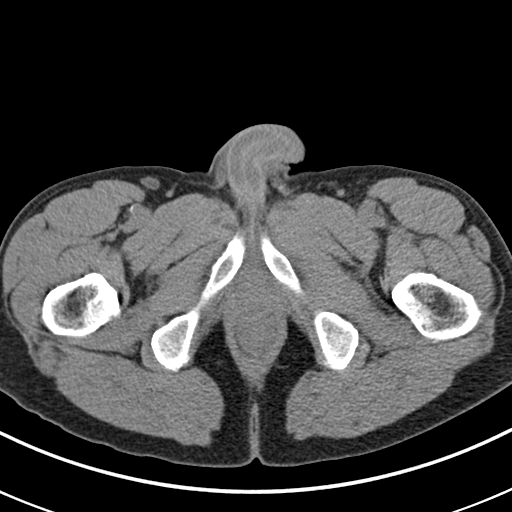
[im 21/94  soft-tissue]
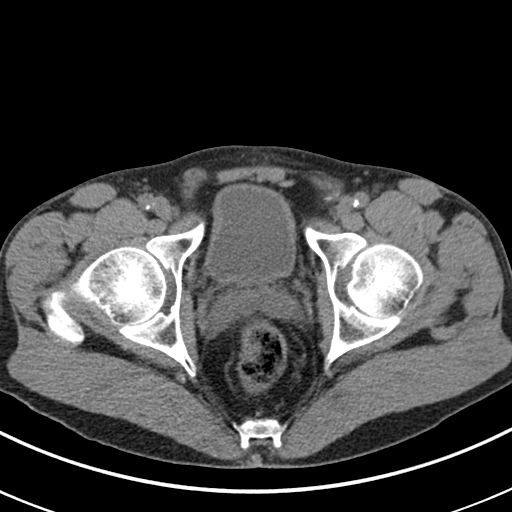
[im 26/94  soft-tissue]
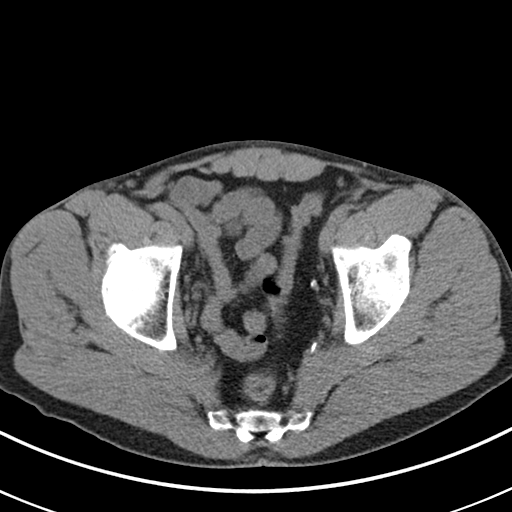
[im 32/94  soft-tissue]
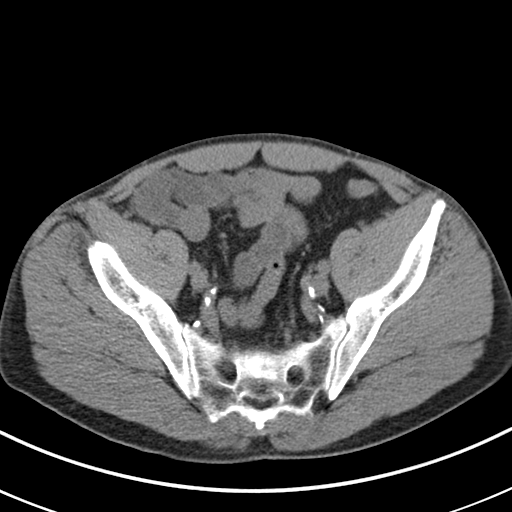
[im 42/94  soft-tissue]
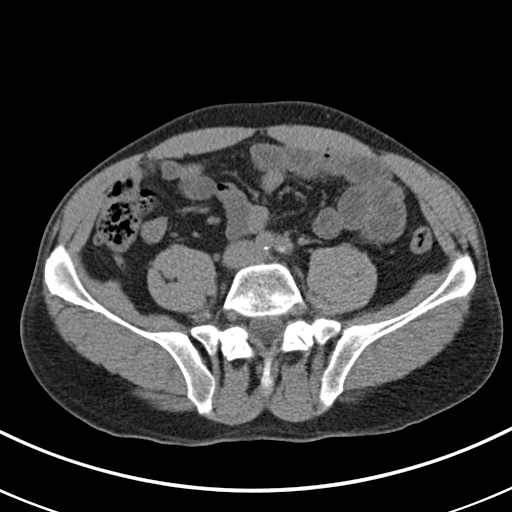
[im 47/94  soft-tissue]
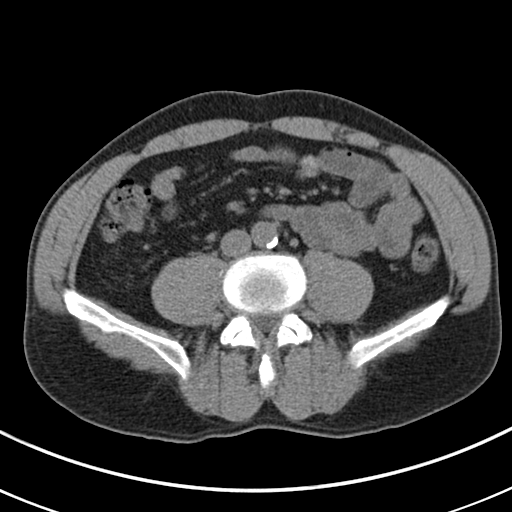
[im 52/94  soft-tissue]
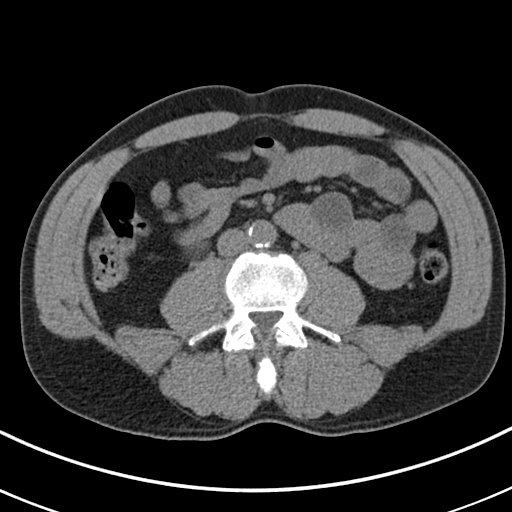
[im 63/94  soft-tissue]
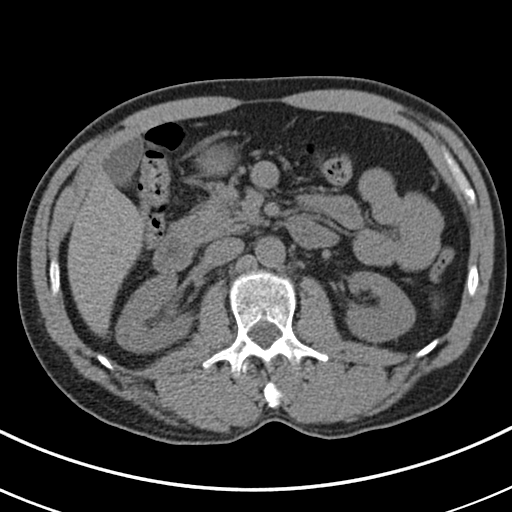
[im 63/94  bone]
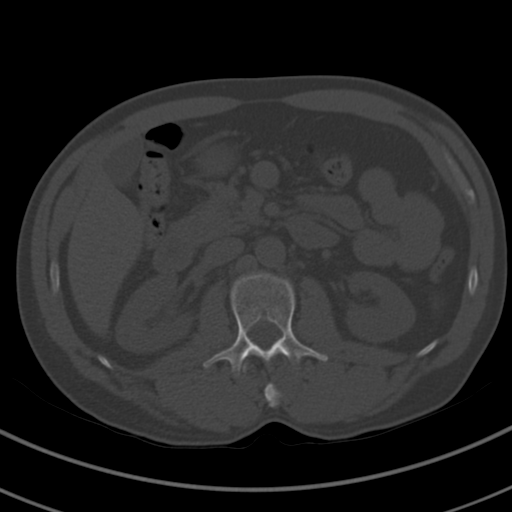
[im 68/94  soft-tissue]
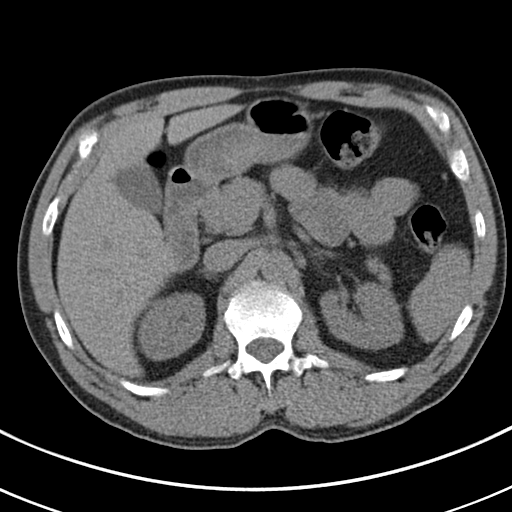
[im 73/94  soft-tissue]
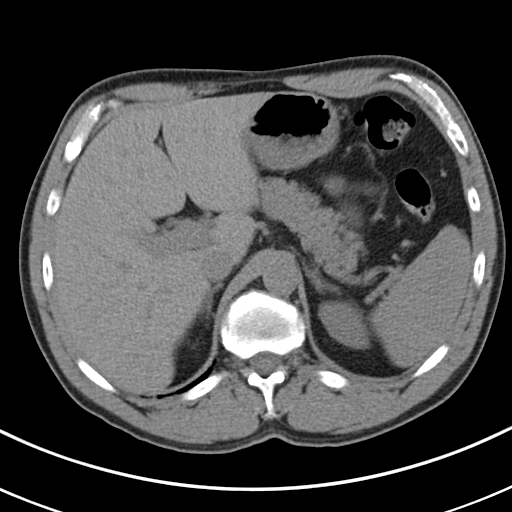
[im 83/94  soft-tissue]
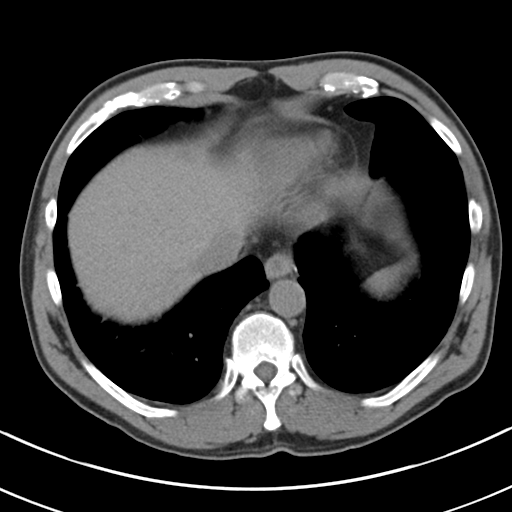
[im 88/94  soft-tissue]
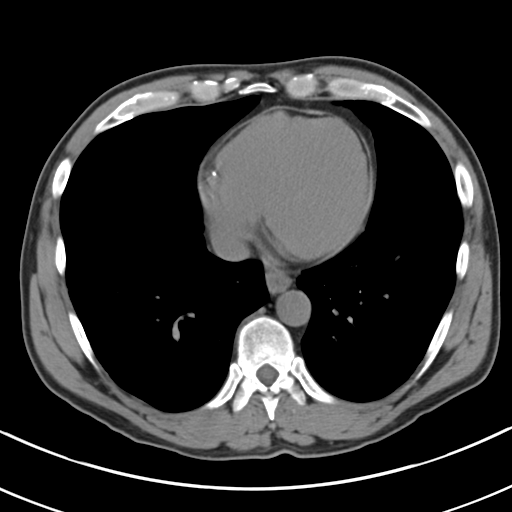

[Series 5: coronal · coronal · 0.68mm/px · 3 of 135 slices shown]
[im 45/135  soft-tissue]
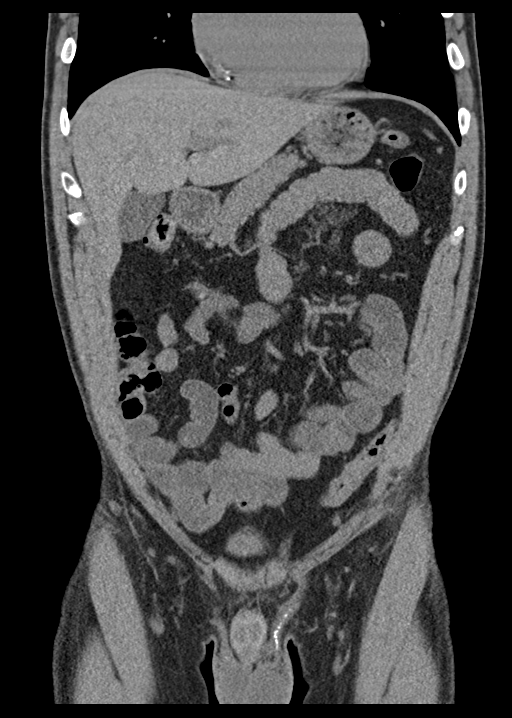
[im 60/135  soft-tissue]
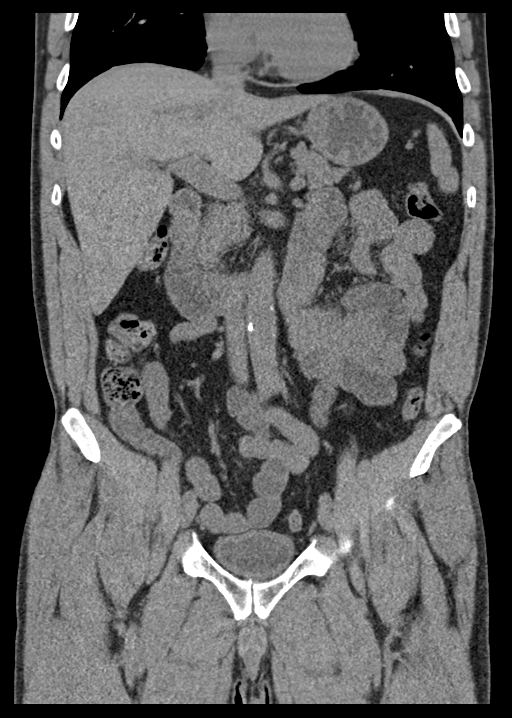
[im 75/135  soft-tissue]
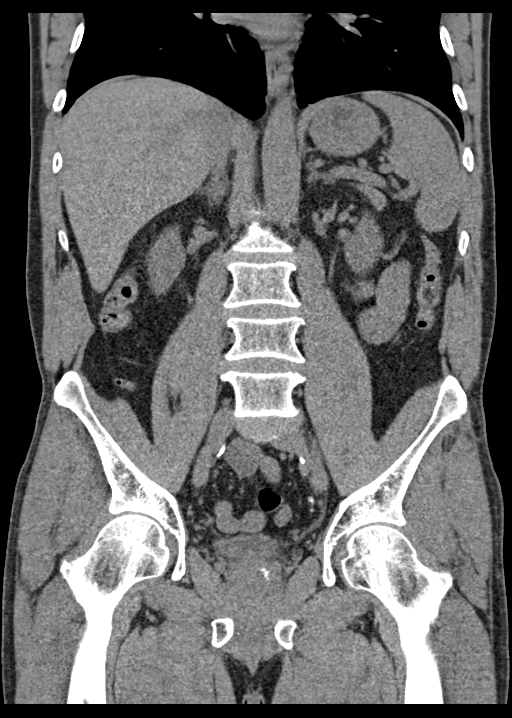

[16 of 46 positions shown; findings below may reference images not displayed]

FINDINGS: Lower chest: Minimal subsegmental atelectasis in RIGHT lower lobe.
Minimal pericardial fluid versus thickening. Scattered coronary
arterial calcifications.

Hepatobiliary: Gallbladder and liver normal appearance

Pancreas: Normal appearance

Spleen: Normal appearance

Adrenals/Urinary Tract: Adrenal glands, kidneys, ureters, and
bladder normal appearance. No urinary tract calcification or
dilatation.

Stomach/Bowel: Normal appendix. Stomach and bowel loops normal
appearance.

Vascular/Lymphatic: Atherosclerotic calcification aorta, iliac
arteries, femoral arteries. Aorta normal caliber. No adenopathy.

Reproductive: Nonspecific prostatic calcifications. Prostate gland
and seminal vesicles otherwise unremarkable.

Other: No free air or free fluid. Small ovoid calcification in the
LEFT hemipelvis may either represent a calcified lymph node or old
fat necrosis. Small umbilical hernia containing fat. No inflammatory
process.

Musculoskeletal: No acute osseous abnormalities.
IMPRESSION: No acute intra-abdominal or intrapelvic abnormalities.

Small umbilical hernia containing fat.

Aortic Atherosclerosis ([YS]-[YS]).

## 2020-09-02 MED ORDER — CYCLOBENZAPRINE HCL 10 MG PO TABS: 10.0000 mg | ORAL_TABLET | Freq: Two times a day (BID) | ORAL | 0 refills | Status: AC | PRN

## 2020-09-02 MED ORDER — LIDOCAINE 5 % EX PTCH: 1.0000 | MEDICATED_PATCH | CUTANEOUS | 0 refills | Status: AC

## 2020-09-02 MED ORDER — LIDOCAINE 5 % EX PTCH: 1.0000 | MEDICATED_PATCH | CUTANEOUS | 0 refills | Status: DC

## 2020-09-02 MED ORDER — DEXAMETHASONE SODIUM PHOSPHATE 10 MG/ML IJ SOLN
10.0000 mg | Freq: Once | INTRAMUSCULAR | Status: AC
  Administered 2020-09-02: 10 mg via INTRAMUSCULAR
  Filled 2020-09-02: qty 1

## 2020-09-02 MED ORDER — CYCLOBENZAPRINE HCL 10 MG PO TABS: 10.0000 mg | ORAL_TABLET | Freq: Two times a day (BID) | ORAL | 0 refills | Status: DC | PRN

## 2020-09-02 NOTE — ED Provider Notes (Signed)
[facility] DEPT Provider Note   CSN: [id number] Arrival date & time: 09/02/20  1321     History Chief Complaint  Patient presents with   Flank Pain    Gabriel Harris is a 60 y.o. male.  HPI     60yo male with history of hypertension, ANCA necrotizing glomerulonephritis/MPO vasculitis and tobacco use disorder who presents with concern for flank pain and headache.  Was seen by Neurology this week for headache, an MRI of brain WWO contrast and MRA was ordered and he was started on nortriptyline.   Flank pain is located on the right, has been waxing and waning over the last 2-3 months but worse recently.  More severe whne he first gets up in the morning, at that time is unbearable.  Not necessarily worse with movements. Worried that his kidney function is worsening given his history of kidney problems. Sees Dr. Justin Mend.  Denies nausea, vomiting, diarrhea, black or bloody stools, fevers, chest pain or dyspnea.  No numbness or weakness, loss of control of bowel or bladder, hx of IVDU, trauma.  Does have urinary frequency.  Reports daily headaches every day, throbbing, present for 2-3 months, also worse when waking in AM. Wakes in sleep with headaches.        Past Medical History:  Diagnosis Date   Hypertension    Kidney disease     Patient Active Problem List   Diagnosis Date Noted   AKI (acute kidney injury) (Early) 03/10/2019   Hyperkalemia 03/10/2019   Normocytic anemia 03/10/2019   Hypoalbuminemia 03/10/2019   Sinus bradycardia 03/10/2019   Tobacco use 03/10/2019   Vertigo 03/10/2019   Diplopia 03/10/2019    Past Surgical History:  Procedure Laterality Date   IR FLUORO GUIDE CV LINE RIGHT  03/12/2019   IR REMOVAL TUN CV CATH W/O FL  04/04/2019   IR US GUIDE VASC ACCESS RIGHT  03/12/2019       Family History  Problem Relation Age of Onset   Hypertension Mother    Diabetes Mellitus II Mother    Hypertension Father    Diabetes Mellitus  II Father    Diabetes Mellitus II Sister     Social History   Tobacco Use   Smoking status: Every Day    Packs/day: 0.25    Types: Cigarettes   Smokeless tobacco: Never  Vaping Use   Vaping Use: Never used  Substance Use Topics   Alcohol use: Not Currently   Drug use: Never    Home Medications Prior to Admission medications   Medication Sig Start Date End Date Taking? Authorizing Provider  amLODipine (NORVASC) 10 MG tablet Take 1 tablet (10 mg total) by mouth daily. 06/04/19 08/03/19  Carmin Muskrat, MD  amLODipine (NORVASC) 5 MG tablet Take 5 mg by mouth daily. 08/18/20   [provider]  cyclobenzaprine (FLEXERIL) 10 MG tablet Take 1 tablet (10 mg total) by mouth 2 (two) times daily as needed for muscle spasms. 09/02/20   Gareth Morgan, MD  hydrALAZINE (APRESOLINE) 25 MG tablet Take 1 tablet (25 mg total) by mouth every 8 (eight) hours. 06/04/19 08/03/19  Carmin Muskrat, MD  lidocaine (LIDODERM) 5 % Place 1 patch onto the skin daily. Remove & Discard patch within 12 hours or as directed by MD 09/02/20   Gareth Morgan, MD  nortriptyline (PAMELOR) 25 MG capsule Take 1 capsule (25 mg total) by mouth at bedtime. 08/31/20   Pieter Partridge, DO    Allergies  Patient has no known allergies.  Review of Systems   Review of Systems  Constitutional:  Negative for fever.  HENT:  Negative for sore throat.   Eyes:  Negative for visual disturbance.  Respiratory:  Negative for shortness of breath.   Cardiovascular:  Negative for chest pain.  Gastrointestinal:  Negative for abdominal pain, nausea and vomiting.  Genitourinary:  Positive for flank pain. Negative for difficulty urinating and dysuria.  Musculoskeletal:  Negative for back pain and neck stiffness.  Skin:  Negative for rash.  Neurological:  Positive for headaches. Negative for syncope, facial asymmetry, weakness and numbness.   Physical Exam Updated Vital Signs BP (!) 144/103 (BP Location: Right Arm)   Pulse (!) 55    Temp 97.7 F (36.5 C) (Oral)   Resp 15   Ht '5\' 4"'$  (1.626 m)   Wt 68.9 kg   SpO2 96%   BMI 26.09 kg/m   Physical Exam Vitals and nursing note reviewed.  Constitutional:      General: He is not in acute distress.    Appearance: He is well-developed. He is not diaphoretic.  HENT:     Head: Normocephalic and atraumatic.  Eyes:     Conjunctiva/sclera: Conjunctivae normal.  Cardiovascular:     Rate and Rhythm: Normal rate and regular rhythm.     Heart sounds: Normal heart sounds.  Pulmonary:     Effort: Pulmonary effort is normal. No respiratory distress.  Abdominal:     General: There is no distension.     Palpations: Abdomen is soft.     Tenderness: There is abdominal tenderness (right flank). There is right CVA tenderness. There is no guarding.  Musculoskeletal:     Cervical back: Normal range of motion.     Comments: Right lumbar back pain  Skin:    General: Skin is warm and dry.  Neurological:     Mental Status: He is alert and oriented to person, place, and time.    ED Results / Procedures / Treatments   Labs (all labs ordered are listed, but only abnormal results are displayed) Labs Reviewed  URINALYSIS, ROUTINE W REFLEX MICROSCOPIC - Abnormal; Notable for the following components:      Result Value   Color, Urine YELLOW (*)    APPearance CLEAR (*)    Hgb urine dipstick SMALL (*)    All other components within normal limits  BASIC METABOLIC PANEL - Abnormal; Notable for the following components:   BUN 36 (*)    Creatinine, Ser 2.40 (*)    GFR, Estimated 30 (*)    All other components within normal limits  CBC WITH DIFFERENTIAL/PLATELET    EKG None  Radiology CT HEAD WO CONTRAST (5MM)  Result Date: 09/02/2020 CLINICAL DATA:  Headache for 6 months, headache worsened by light EXAM: CT HEAD WITHOUT CONTRAST TECHNIQUE: Contiguous axial images were obtained from the base of the skull through the vertex without intravenous contrast. Sagittal and coronal MPR images  reconstructed from axial data set. COMPARISON:  11/30/2004 FINDINGS: Brain: Normal ventricular morphology. No midline shift or mass effect. Normal appearance of brain parenchyma. No intracranial hemorrhage, mass lesion, evidence of acute infarction, or extra-axial fluid collection. Vascular: No hyperdense vessels. Atherosclerotic calcification of internal carotid arteries bilaterally at skull base Skull: Intact Sinuses/Orbits: Clear Other: N/A IMPRESSION: No acute intracranial abnormalities. Electronically Signed   By: Lavonia Dana M.D.   On: 09/02/2020 18:27   CT Renal Stone Study  Result Date: 09/02/2020 CLINICAL DATA:  RIGHT flank  pain, urinary frequency, and urinary odor for 3-4 weeks EXAM: CT ABDOMEN AND PELVIS WITHOUT CONTRAST TECHNIQUE: Multidetector CT imaging of the abdomen and pelvis was performed following the standard protocol without IV contrast. Sagittal and coronal MPR images reconstructed from axial data set. No oral contrast administered. COMPARISON:  None FINDINGS: Lower chest: Minimal subsegmental atelectasis in RIGHT lower lobe. Minimal pericardial fluid versus thickening. Scattered coronary arterial calcifications. Hepatobiliary: Gallbladder and liver normal appearance Pancreas: Normal appearance Spleen: Normal appearance Adrenals/Urinary Tract: Adrenal glands, kidneys, ureters, and bladder normal appearance. No urinary tract calcification or dilatation. Stomach/Bowel: Normal appendix. Stomach and bowel loops normal appearance. Vascular/Lymphatic: Atherosclerotic calcification aorta, iliac arteries, femoral arteries. Aorta normal caliber. No adenopathy. Reproductive: Nonspecific prostatic calcifications. Prostate gland and seminal vesicles otherwise unremarkable. Other: No free air or free fluid. Small ovoid calcification in the LEFT hemipelvis may either represent a calcified lymph node or old fat necrosis. Small umbilical hernia containing fat. No inflammatory process. Musculoskeletal: No  acute osseous abnormalities. IMPRESSION: No acute intra-abdominal or intrapelvic abnormalities. Small umbilical hernia containing fat. Aortic Atherosclerosis (ICD10-I70.0). Electronically Signed   By: Lavonia Dana M.D.   On: 09/02/2020 18:33    Procedures Procedures   Medications Ordered in ED Medications  dexamethasone (DECADRON) injection 10 mg (10 mg Intramuscular Given 09/02/20 2005)    ED Course  I have reviewed the triage vital signs and the nursing notes.  Pertinent labs & imaging results that were available during my care of the patient were reviewed by me and considered in my medical decision making (see chart for details).    MDM Rules/Calculators/A&P                            60yo male with history of hypertension, ANCA necrotizing glomerulonephritis/MPO vasculitis and tobacco use disorder who presents with concern for flank pain and headache.  Given red flags of daily headache, waking at night, worsening, ordered screening head CT to evaluate for abnormalities and it shows no intracranial hemorrhage, no other acute abnormalities.  Recommend keeping MRI scheduled with neurology and continued Neurology follow up.   CT stone study shows no acute abnormalities.  His CKD is stable. No sign of UTI. No signs of cauda equina, epidural abscess, osteomyelitis.  Recommend continued follow up with his Neurologist, Nephrologist and PCP.    Also reports left shoulder pain prior to discharge, worse with movement, present for months, worse with abduction. No chest pain or dyspnea, doubt intrathoracic abnormality/ACS/dissection.  Suspect msk/rotator cuff pain. Given shoulder sling to wear for comfort, recommend PCP follow up. No sign of fracture/dislocation/septic joint. Given rx for flexeril and lidocaine patch.  Patient discharged in stable condition with understanding of reasons to return.    Final Clinical Impression(s) / ED Diagnoses Final diagnoses:  Right flank pain  Acute  right-sided low back pain without sciatica    Rx / DC Orders ED Discharge Orders          Ordered    cyclobenzaprine (FLEXERIL) 10 MG tablet  2 times daily PRN,   Status:  Discontinued        09/02/20 1942    lidocaine (LIDODERM) 5 %  Every 24 hours,   Status:  Discontinued        09/02/20 1942    cyclobenzaprine (FLEXERIL) 10 MG tablet  2 times daily PRN        09/02/20 2012    lidocaine (LIDODERM) 5 %  Every 24 hours  09/02/20 2012             Gareth Morgan, MD 09/03/20 [phone]

## 2020-09-02 NOTE — ED Triage Notes (Addendum)
Patient c/o right flank pain and states he has "kidney problems."  Patient reports urinary frequency and states that his urine has an odor x 3-4 weeks.  Patient also c/o having a headache x 6 months. Patient states the light makes the headache worse.

## 2020-09-02 NOTE — ED Provider Notes (Signed)
Emergency Medicine Provider Triage Evaluation Note  Gabriel Harris , a 60 y.o. male  was evaluated in triage.  Pt complains of foul smelling urine.  Pt has chronic kidney disease.    Review of Systems  Positive: Flank pain  Negative: No fever  Physical Exam  BP 136/88 (BP Location: Left Arm)   Pulse 65   Temp 98.7 F (37.1 C) (Oral)   Resp 16   Ht '5\' 4"'$  (1.626 m)   Wt 68.9 kg   SpO2 96%   BMI 26.09 kg/m  Gen:   Awake, no distress   Resp:  Normal effort  MSK:   Moves extremities without difficulty  Other:    Medical Decision Making  Medically screening exam initiated at 2:23 PM.  Appropriate orders placed.  Gabriel Harris was informed that the remainder of the evaluation will be completed by another provider, this initial triage assessment does not replace that evaluation, and the importance of remaining in the ED until their evaluation is complete.     Gabriel Harris, [place] 09/02/20 1423    Arnaldo Natal, MD 09/02/20 [phone]

## 2021-02-17 ENCOUNTER — Emergency Department (HOSPITAL_COMMUNITY): Payer: Medicaid Other

## 2021-02-17 ENCOUNTER — Emergency Department (HOSPITAL_COMMUNITY)
Admission: EM | Admit: 2021-02-17 | Discharge: 2021-02-17 | Disposition: A | Discharge status: Home / Self Care | Payer: Medicaid Other | Attending: Emergency Medicine | Admitting: Emergency Medicine

## 2021-02-17 ENCOUNTER — Encounter (HOSPITAL_COMMUNITY): Payer: Self-pay

## 2021-02-17 DIAGNOSIS — M549 Dorsalgia, unspecified: Secondary | ICD-10-CM | POA: Diagnosis not present

## 2021-02-17 DIAGNOSIS — R112 Nausea with vomiting, unspecified: Secondary | ICD-10-CM | POA: Insufficient documentation

## 2021-02-17 DIAGNOSIS — Y990 Civilian activity done for income or pay: Secondary | ICD-10-CM | POA: Insufficient documentation

## 2021-02-17 DIAGNOSIS — R7989 Other specified abnormal findings of blood chemistry: Secondary | ICD-10-CM | POA: Insufficient documentation

## 2021-02-17 DIAGNOSIS — W19XXXA Unspecified fall, initial encounter: Secondary | ICD-10-CM | POA: Diagnosis not present

## 2021-02-17 DIAGNOSIS — R053 Chronic cough: Secondary | ICD-10-CM | POA: Diagnosis not present

## 2021-02-17 DIAGNOSIS — R109 Unspecified abdominal pain: Secondary | ICD-10-CM | POA: Diagnosis not present

## 2021-02-17 DIAGNOSIS — E860 Dehydration: Secondary | ICD-10-CM | POA: Diagnosis not present

## 2021-02-17 DIAGNOSIS — R55 Syncope and collapse: Secondary | ICD-10-CM | POA: Diagnosis not present

## 2021-02-17 LAB — CBC WITH DIFFERENTIAL/PLATELET
Abs Immature Granulocytes: 0.01 10*3/uL (ref 0.00–0.07)
Basophils Absolute: 0.1 10*3/uL (ref 0.0–0.1)
Basophils Relative: 1 %
Eosinophils Absolute: 0.2 10*3/uL (ref 0.0–0.5)
Eosinophils Relative: 3 %
HCT: 55.5 % — ABNORMAL HIGH (ref 39.0–52.0)
Hemoglobin: 18.3 g/dL — ABNORMAL HIGH (ref 13.0–17.0)
Immature Granulocytes: 0 %
Lymphocytes Relative: 19 %
Lymphs Abs: 1 10*3/uL (ref 0.7–4.0)
MCH: 30.2 pg (ref 26.0–34.0)
MCHC: 33 g/dL (ref 30.0–36.0)
MCV: 91.6 fL (ref 80.0–100.0)
Monocytes Absolute: 0.9 10*3/uL (ref 0.1–1.0)
Monocytes Relative: 16 %
Neutro Abs: 3.1 10*3/uL (ref 1.7–7.7)
Neutrophils Relative %: 61 %
Platelets: 165 10*3/uL (ref 150–400)
RBC: 6.06 MIL/uL — ABNORMAL HIGH (ref 4.22–5.81)
RDW: 13.7 % (ref 11.5–15.5)
WBC: 5.2 10*3/uL (ref 4.0–10.5)
nRBC: 0 % (ref 0.0–0.2)

## 2021-02-17 LAB — COMPREHENSIVE METABOLIC PANEL
ALT: 15 U/L (ref 0–44)
AST: 20 U/L (ref 15–41)
Albumin: 4.1 g/dL (ref 3.5–5.0)
Alkaline Phosphatase: 82 U/L (ref 38–126)
Anion gap: 7 (ref 5–15)
BUN: 25 mg/dL — ABNORMAL HIGH (ref 6–20)
CO2: 23 mmol/L (ref 22–32)
Calcium: 8.8 mg/dL — ABNORMAL LOW (ref 8.9–10.3)
Chloride: 107 mmol/L (ref 98–111)
Creatinine, Ser: 2.37 mg/dL — ABNORMAL HIGH (ref 0.61–1.24)
GFR, Estimated: 31 mL/min — ABNORMAL LOW (ref >60)
Glucose, Bld: 93 mg/dL (ref 70–99)
Potassium: 4.6 mmol/L (ref 3.5–5.1)
Sodium: 137 mmol/L (ref 135–145)
Total Bilirubin: 0.2 mg/dL — ABNORMAL LOW (ref 0.3–1.2)
Total Protein: 7.6 g/dL (ref 6.5–8.1)

## 2021-02-17 LAB — URINALYSIS, ROUTINE W REFLEX MICROSCOPIC
Bilirubin Urine: NEGATIVE
Glucose, UA: NEGATIVE mg/dL
Ketones, ur: NEGATIVE mg/dL
Leukocytes,Ua: NEGATIVE
Nitrite: NEGATIVE
Protein, ur: NEGATIVE mg/dL
Specific Gravity, Urine: 1.015 (ref 1.005–1.030)
pH: 5 (ref 5.0–8.0)

## 2021-02-17 LAB — CBG MONITORING, ED: Glucose-Capillary: 87 mg/dL (ref 70–99)

## 2021-02-17 LAB — TROPONIN I (HIGH SENSITIVITY)
Troponin I (High Sensitivity): 9 ng/L (ref <18)
Troponin I (High Sensitivity): 8 ng/L (ref <18)

## 2021-02-17 LAB — LIPASE, BLOOD: Lipase: 78 U/L — ABNORMAL HIGH (ref 11–51)

## 2021-02-17 IMAGING — CT CT RENAL STONE PROTOCOL
2 of 4 series · 16 of 46 positions shown, 18 images · non-contrast
Comparison: [DATE]

CLINICAL DATA: Nausea vomiting, diarrhea for several days



[Series 2: axial st · axial · 0.83mm/px · z∈[-492,-106]mm · 13 of 87 slices shown, 15 images]
[im 5/87  soft-tissue]
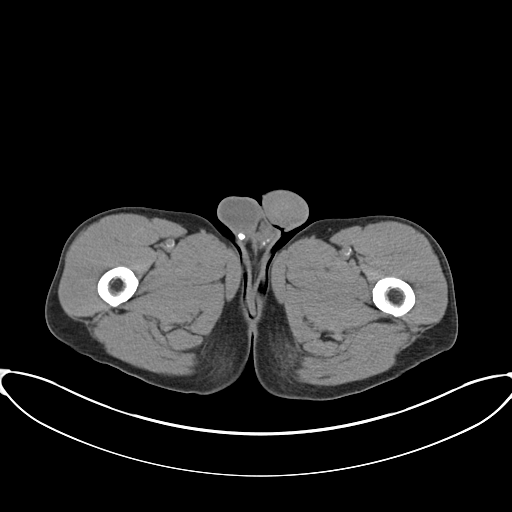
[im 5/87  bone]
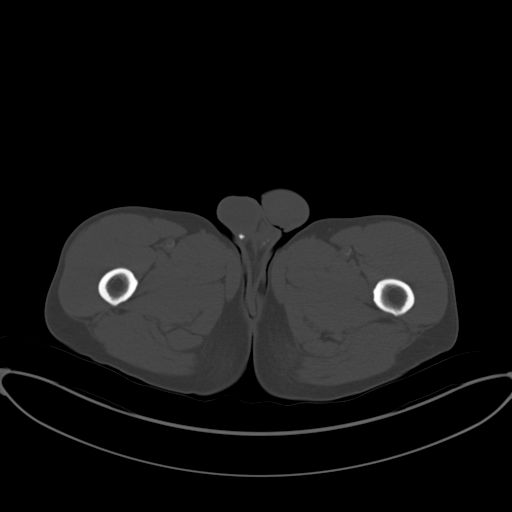
[im 13/87  soft-tissue]
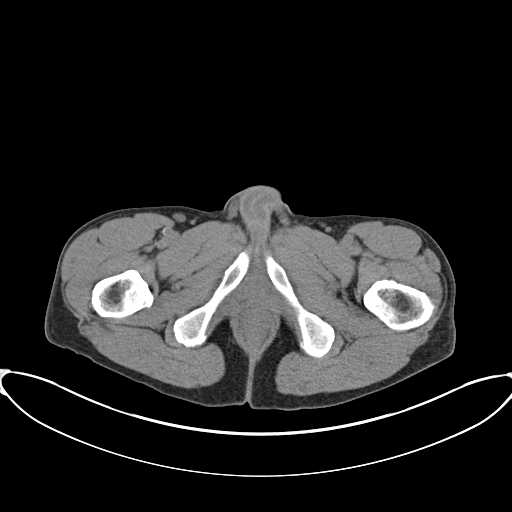
[im 18/87  soft-tissue]
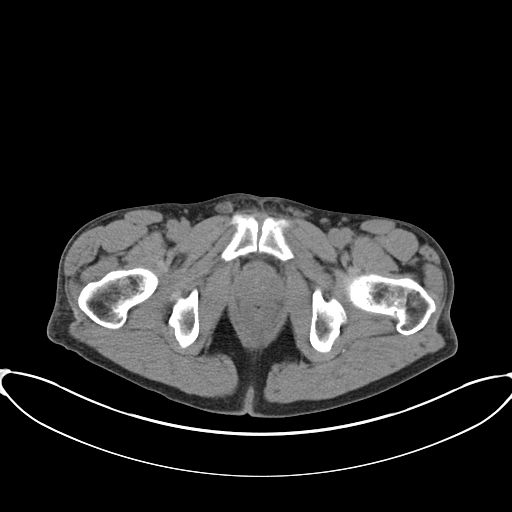
[im 26/87  soft-tissue]
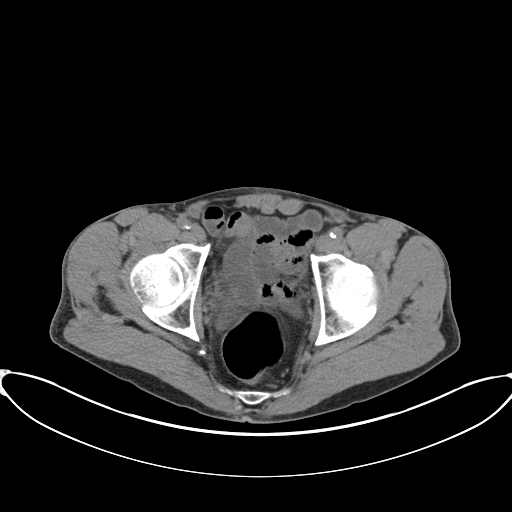
[im 31/87  soft-tissue]
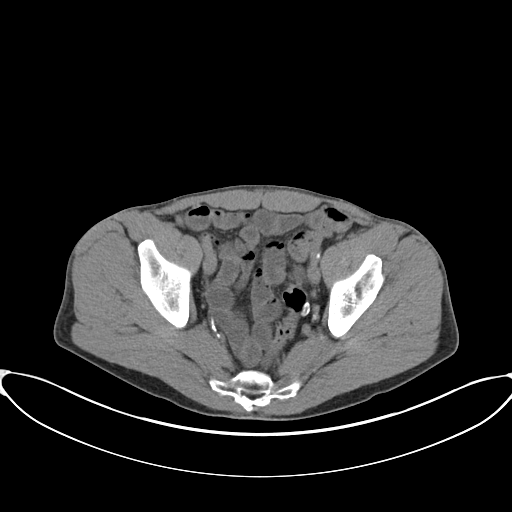
[im 39/87  soft-tissue]
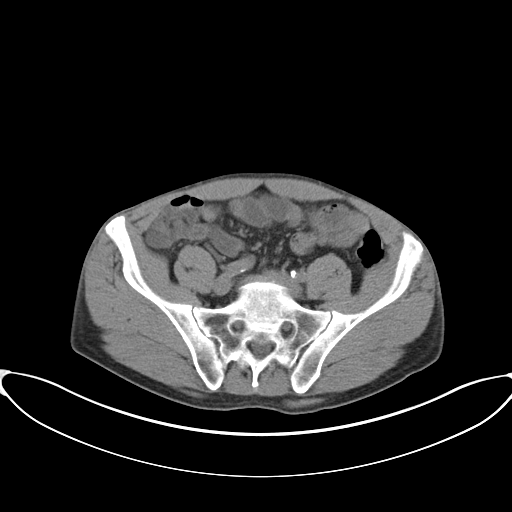
[im 44/87  soft-tissue]
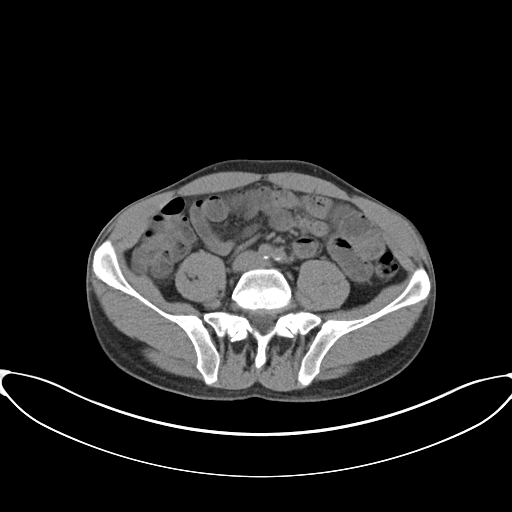
[im 48/87  soft-tissue]
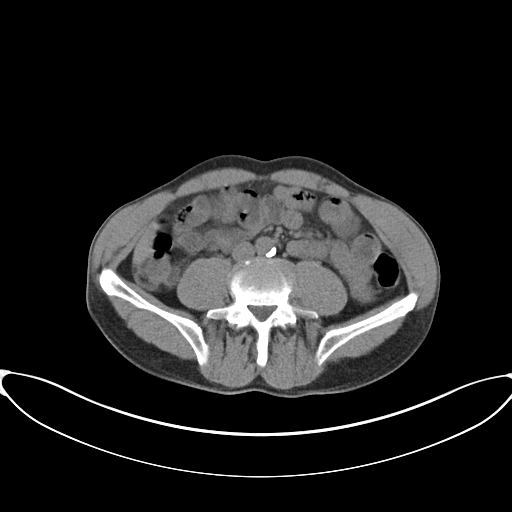
[im 56/87  soft-tissue]
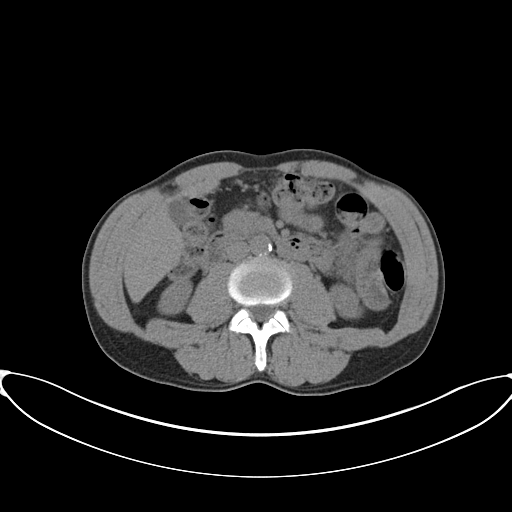
[im 56/87  bone]
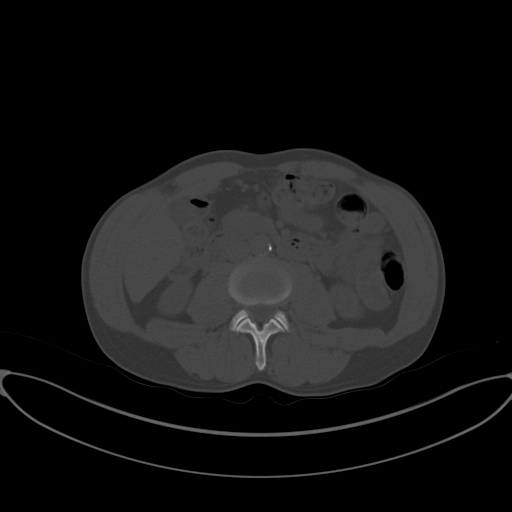
[im 61/87  soft-tissue]
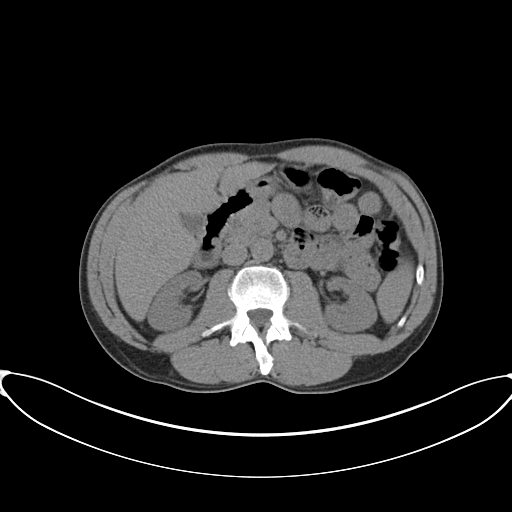
[im 69/87  soft-tissue]
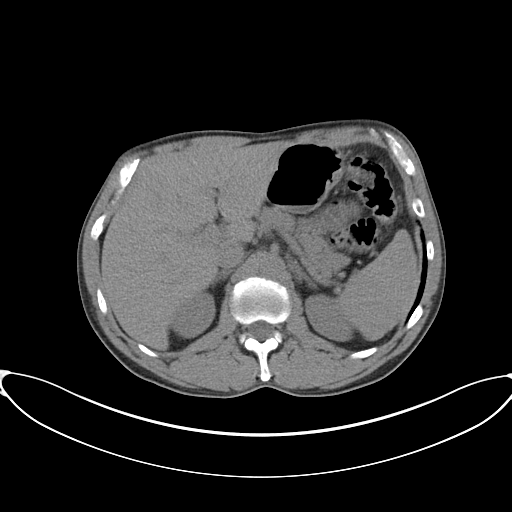
[im 74/87  soft-tissue]
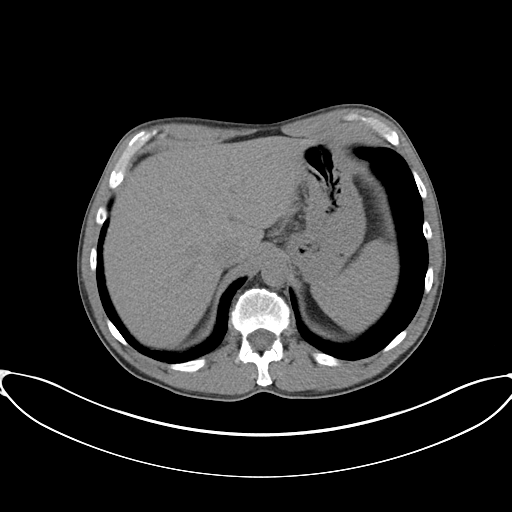
[im 82/87  soft-tissue]
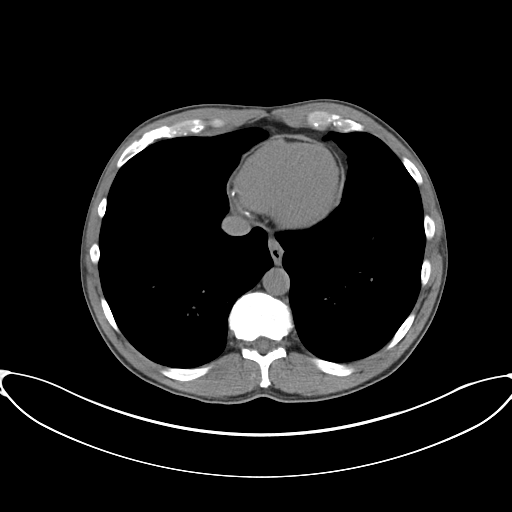

[Series 4: coronal · coronal · 0.71mm/px · 3 of 148 slices shown]
[im 50/148  soft-tissue]
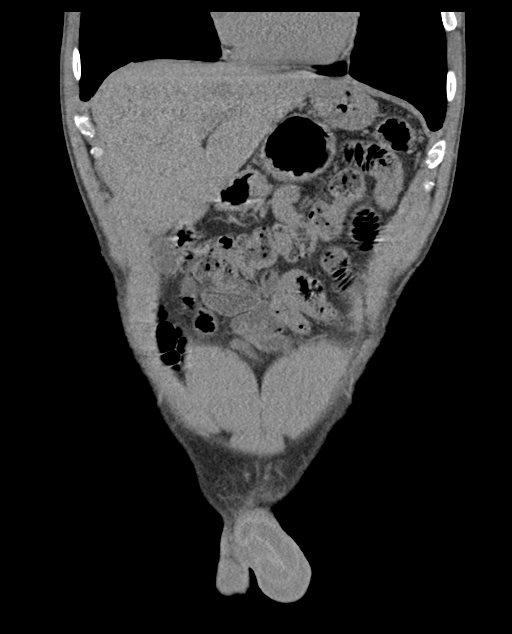
[im 66/148  soft-tissue]
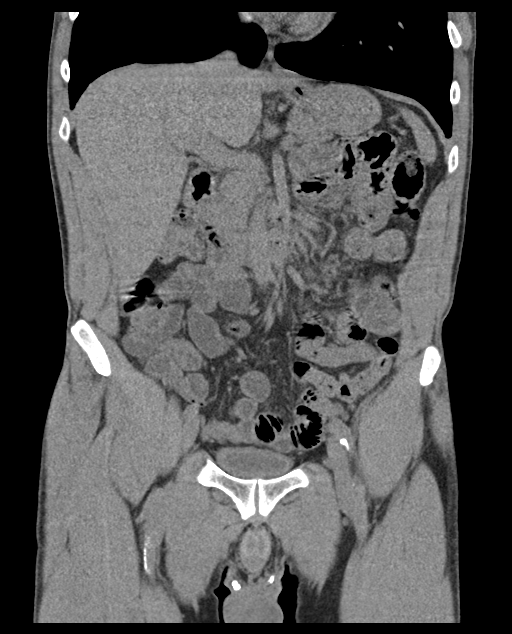
[im 82/148  soft-tissue]
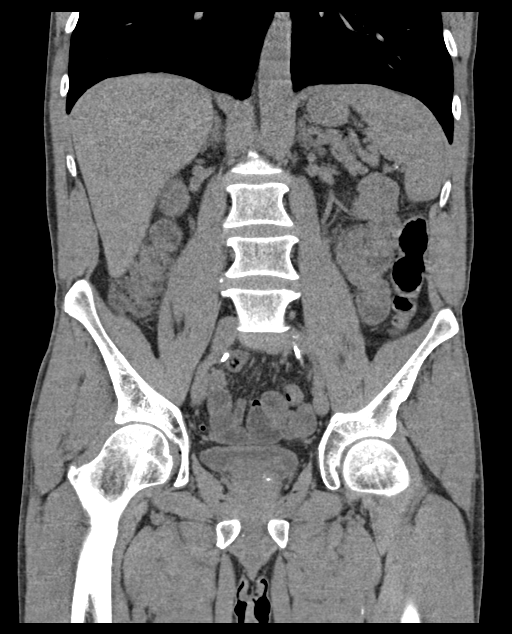

[16 of 46 positions shown; findings below may reference images not displayed]

FINDINGS: Lower chest: No acute pleural or parenchymal lung disease.

Hepatobiliary: Unremarkable unenhanced appearance of the liver and
gallbladder.

Pancreas: Unremarkable unenhanced appearance.

Spleen: Unremarkable unenhanced appearance.

Adrenals/Urinary Tract: No urinary tract calculi or obstructive
uropathy. The adrenals and bladder are unremarkable.

Stomach/Bowel: No bowel obstruction or ileus. Normal appendix right
lower quadrant. No bowel wall thickening or inflammatory change.

Vascular/Lymphatic: Aortic atherosclerosis. No enlarged abdominal or
pelvic lymph nodes.

Reproductive: Prostate is unremarkable.

Other: No free fluid or free gas.  No abdominal wall hernia.

Musculoskeletal: No acute or destructive bony lesions. Reconstructed
images demonstrate no additional findings.
IMPRESSION: 1. No acute intra-abdominal or intrapelvic process.
2.  Aortic Atherosclerosis ([4P]-[4P]).

## 2021-02-17 IMAGING — CT CT HEAD W/O CM
3 series · 14 of 46 positions shown, 16 images · non-contrast
Comparison: CT head [DATE].

CLINICAL DATA: Head trauma, abnormal mental status (Age 18-64y);
Neck trauma, dangerous injury mechanism (Age 16-64y)



[Series 2: head wo · axial · 0.47mm/px · z∈[-116,+4]mm · 8 of 29 slices shown, 10 images]
[im 3/29  brain]
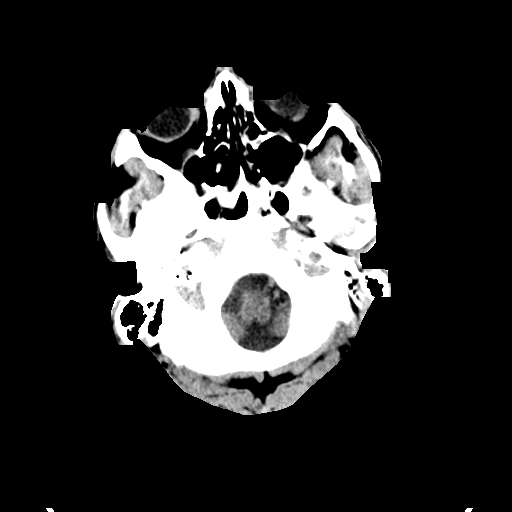
[im 3/29  bone]
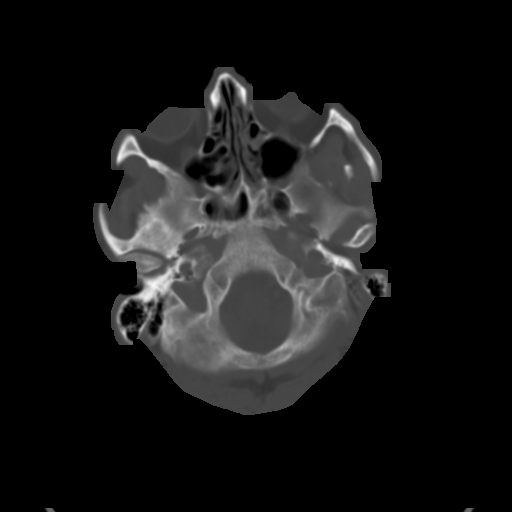
[im 7/29  brain]
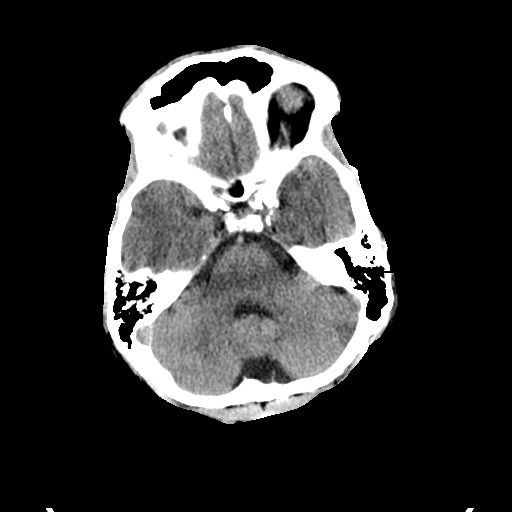
[im 10/29  brain]
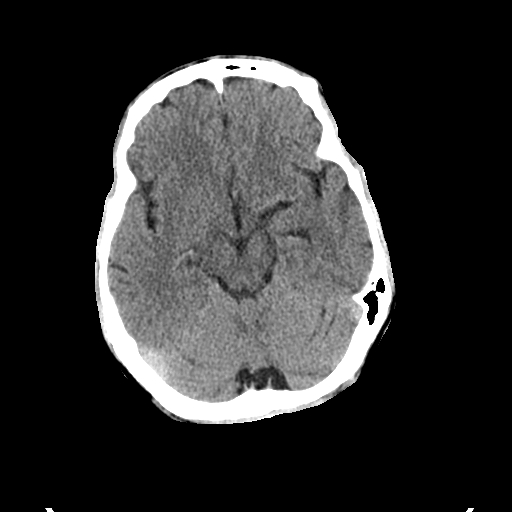
[im 13/29  brain]
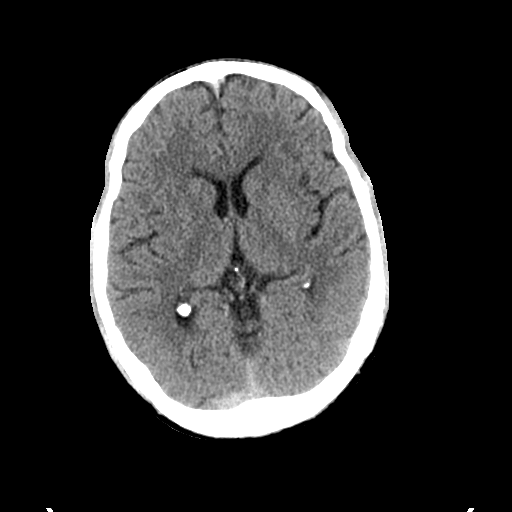
[im 17/29  brain]
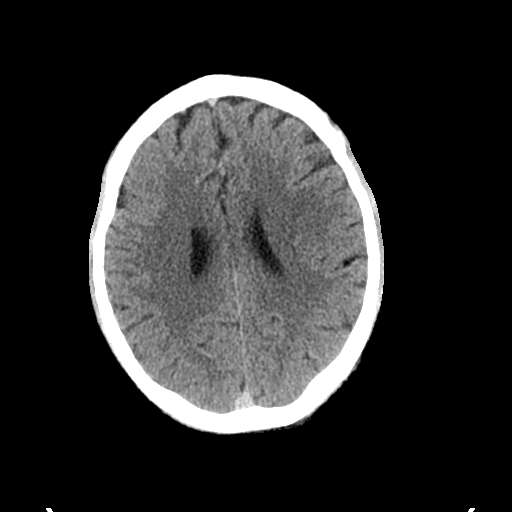
[im 17/29  bone]
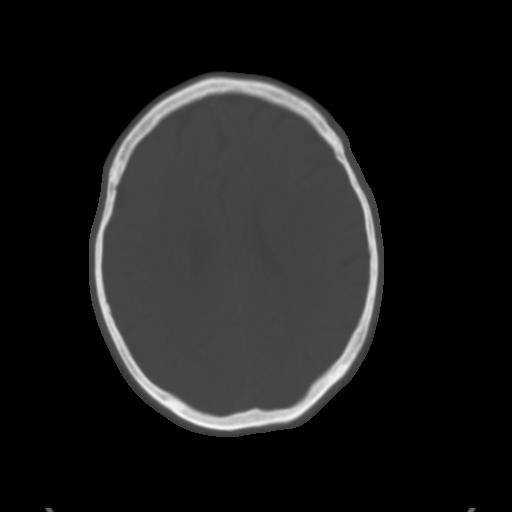
[im 20/29  brain]
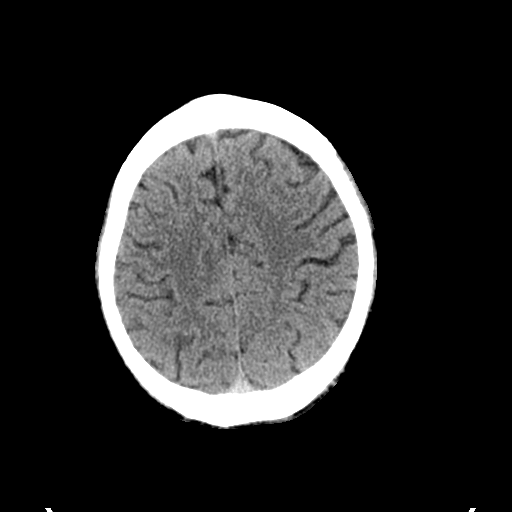
[im 23/29  brain]
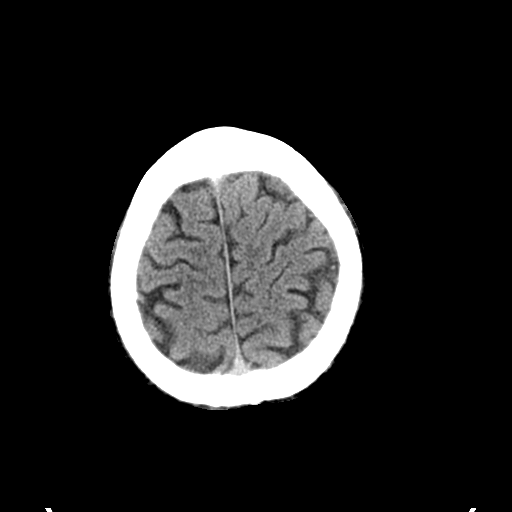
[im 27/29  brain]
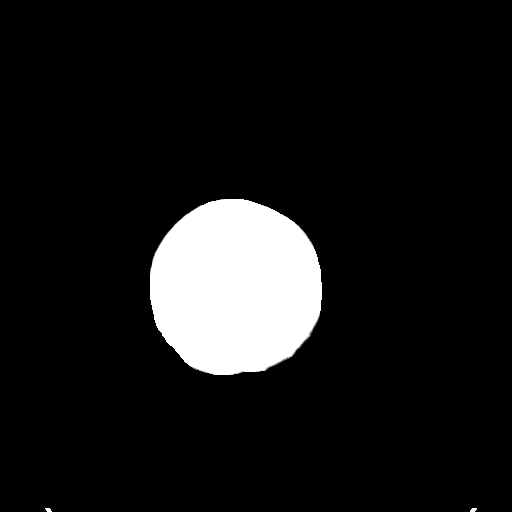

[Series 5: coronal soft tissue · coronal · 0.31mm/px · 3 of 66 slices shown]
[im 22/66  brain]
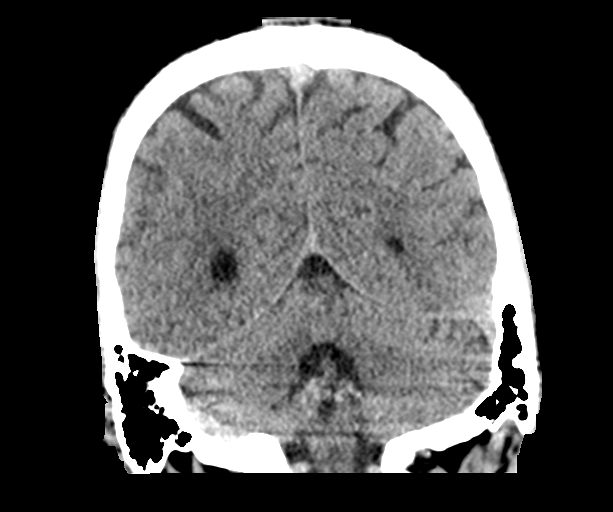
[im 29/66  brain]
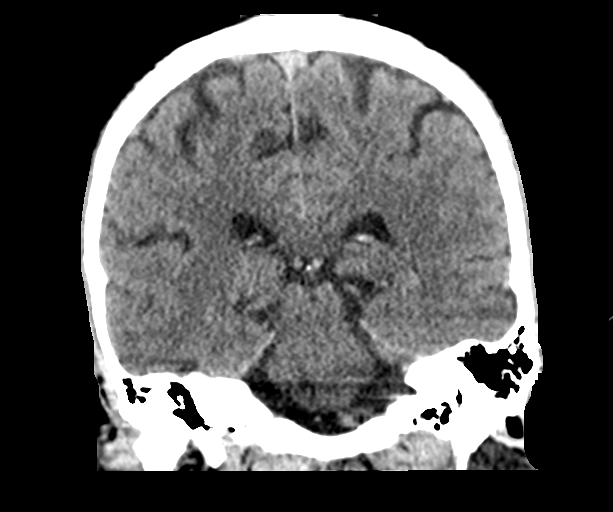
[im 37/66  brain]
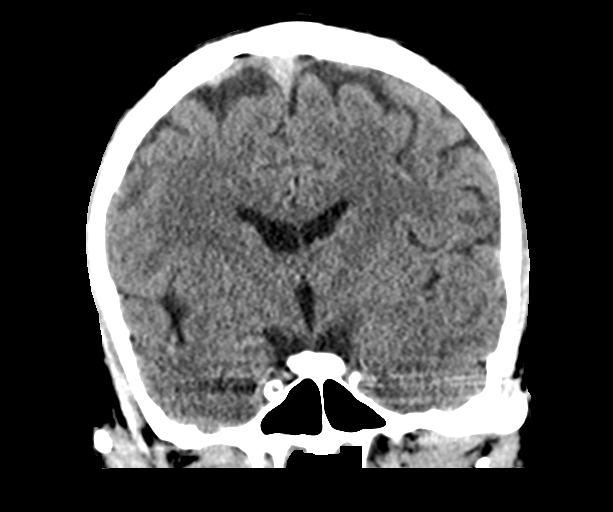

[Series 6: sagittal soft tissue · sagittal · 0.28mm/px · 3 of 57 slices shown]
[im 19/57  brain]
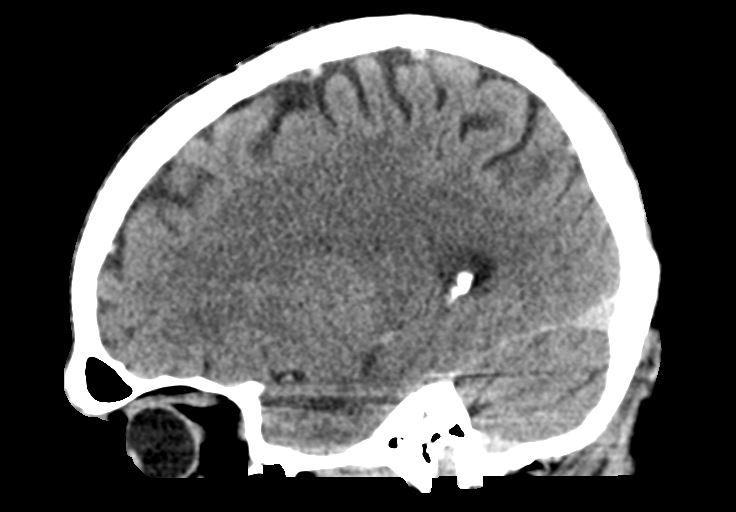
[im 29/57  brain]
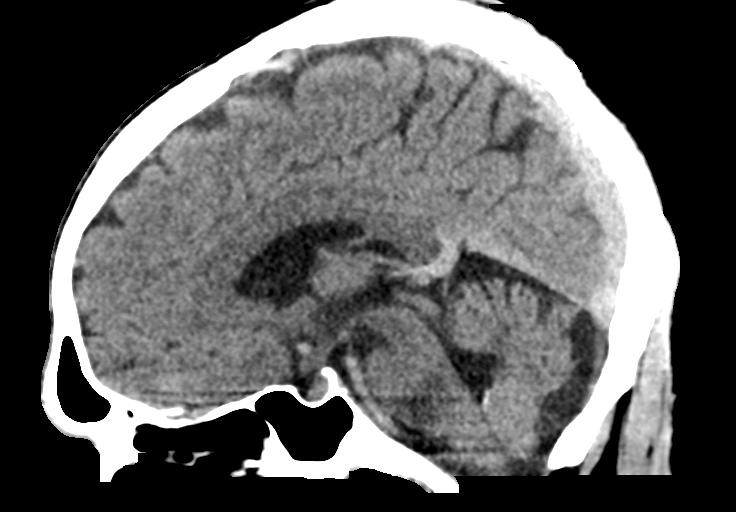
[im 38/57  brain]
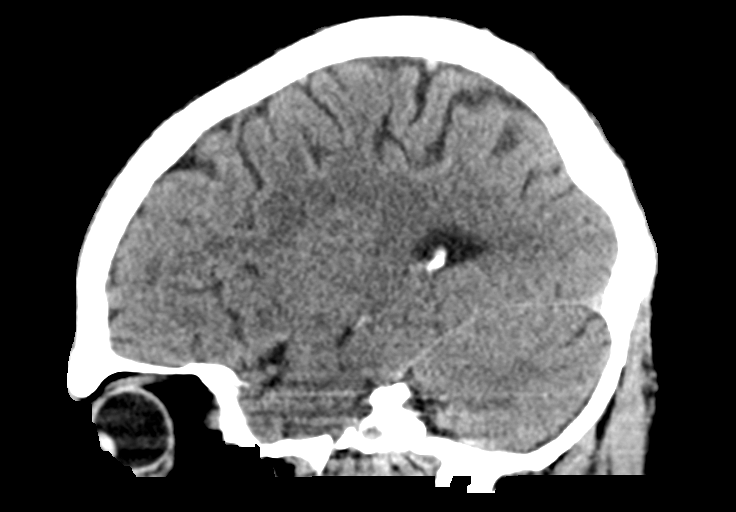

[14 of 46 positions shown; findings below may reference images not displayed]

FINDINGS: CT HEAD FINDINGS

Brain: No evidence of acute large vascular territory infarction,
hemorrhage, hydrocephalus, extra-axial collection or mass
lesion/mass effect. Small remote appearing infarct in the right
frontal periventricular lobe. Small mega cisterna magna.

Vascular: No hyperdense vessel identified. Calcific intracranial
atherosclerosis.

Skull: No acute fracture.

Sinuses/Orbits: Mild paranasal sinus mucosal thickening.

Other: No mastoid effusions.

CT CERVICAL SPINE FINDINGS

Alignment: Mild reversal of normal cervical lordosis. Slight
retrolisthesis of C4 on C5, likely degenerative given degenerative
changes at this level.

Skull base and vertebrae: No evidence of acute fracture. Height loss
of lower cervical vertebral bodies is favored chronic given no
trabecular sclerosis or lucency and similar appearance of the imaged
lower cervical vertebral bodies on prior CT chest from [DATE].

Soft tissues and spinal canal: No prevertebral fluid or swelling. No
visible canal hematoma.

Disc levels: Moderate degenerative disc disease at C4-C5 with
endplate sclerosis/irregularity, disc height loss and posterior disc
osteophyte complexes. Right greater than left facet and
uncovertebral hypertrophy at this level with probably moderate right
foraminal stenosis.

Upper chest: Visualized lung apices are clear.
IMPRESSION: CT head:

1. No evidence of acute intracranial abnormality.
2. Small remote appearing infarct in the right frontal
periventricular lobe.

CT cervical spine:

1. No evidence of acute fracture or traumatic malalignment.
2. C4-C5 degenerative change, detailed above.

## 2021-02-17 IMAGING — CT CT CERVICAL SPINE W/O CM
3 of 4 series · 12 of 33 positions shown, 14 images · non-contrast
Comparison: CT head [DATE].

CLINICAL DATA: Head trauma, abnormal mental status (Age 18-64y);
Neck trauma, dangerous injury mechanism (Age 16-64y)



[Series 5: orthogonal bone · axial · 0.23mm/px · z∈[-271,-151]mm · 4 of 94 slices shown, 5 images]
[im 16/94  soft-tissue]
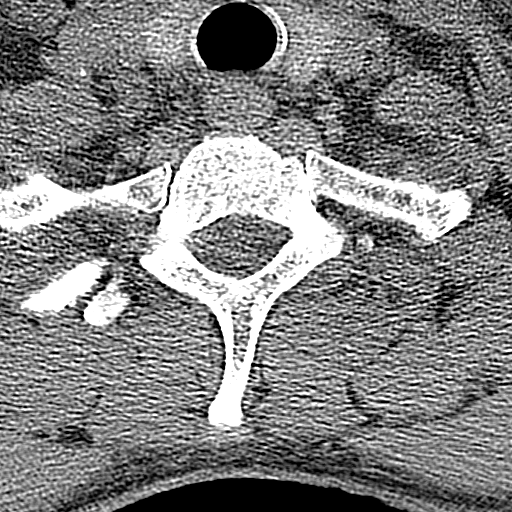
[im 16/94  bone]
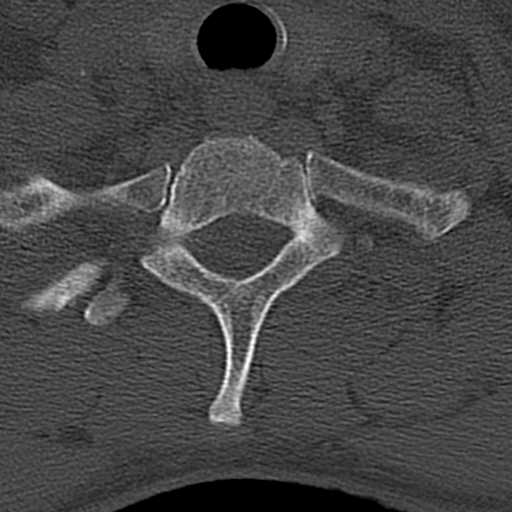
[im 32/94  bone]
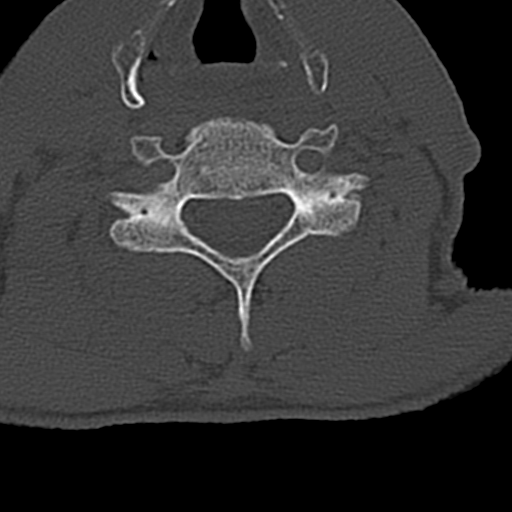
[im 63/94  bone]
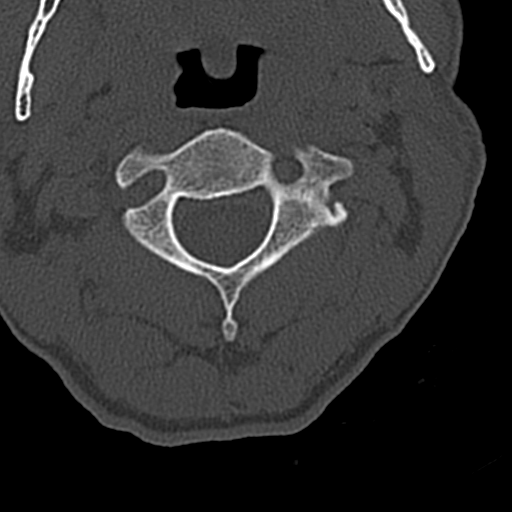
[im 78/94  bone]
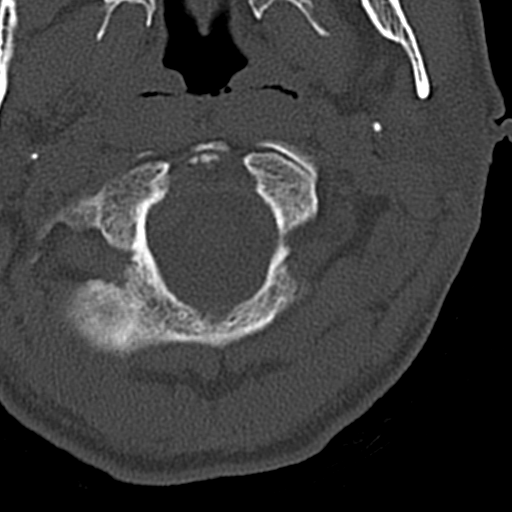

[Series 6: coronal bone · coronal · 0.24mm/px · 3 of 61 slices shown]
[im 13/61  bone]
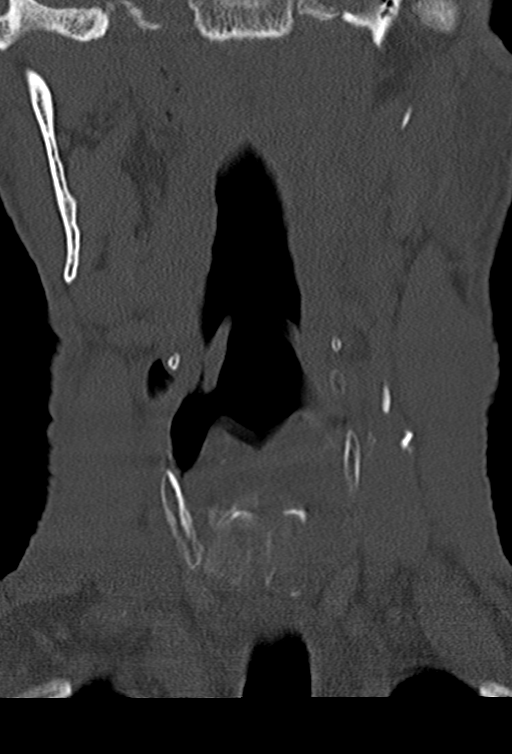
[im 25/61  bone]
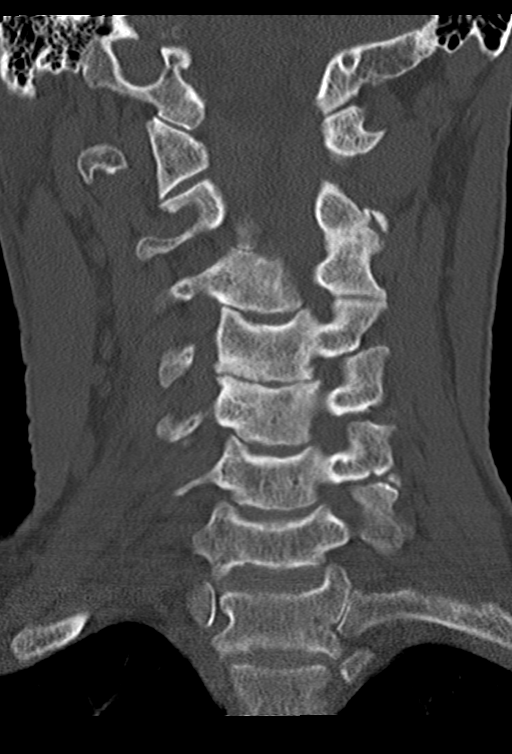
[im 37/61  bone]
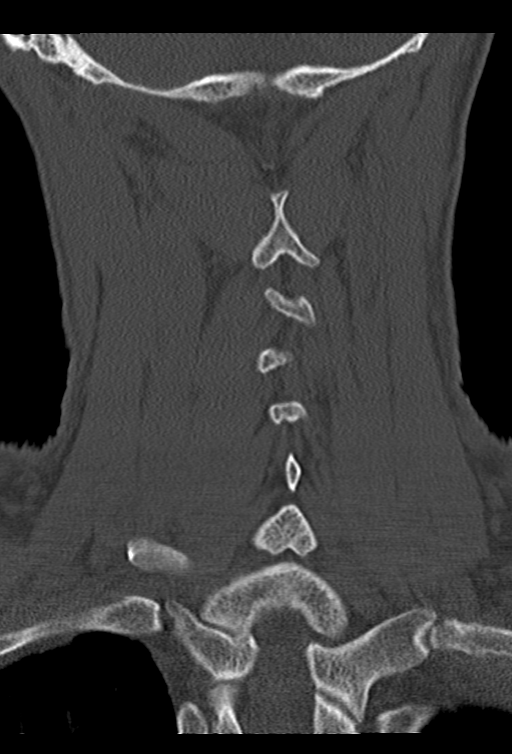

[Series 7: sagittal bone · sagittal · 0.26mm/px · 5 of 61 slices shown, 6 images]
[im 21/61  bone]
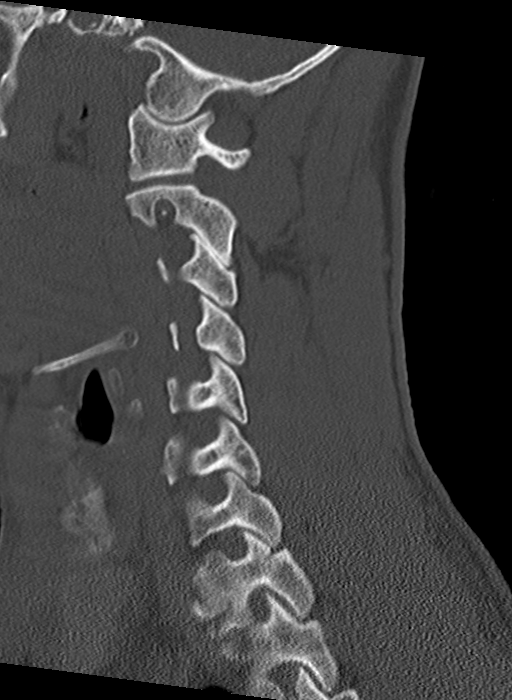
[im 26/61  bone]
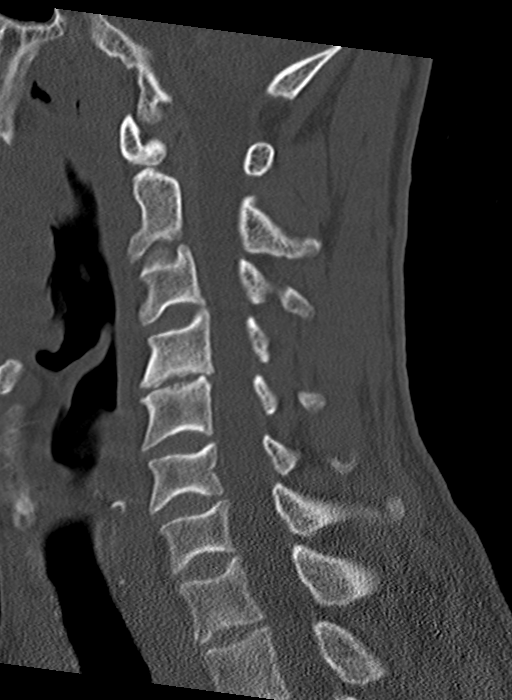
[im 31/61  soft-tissue]
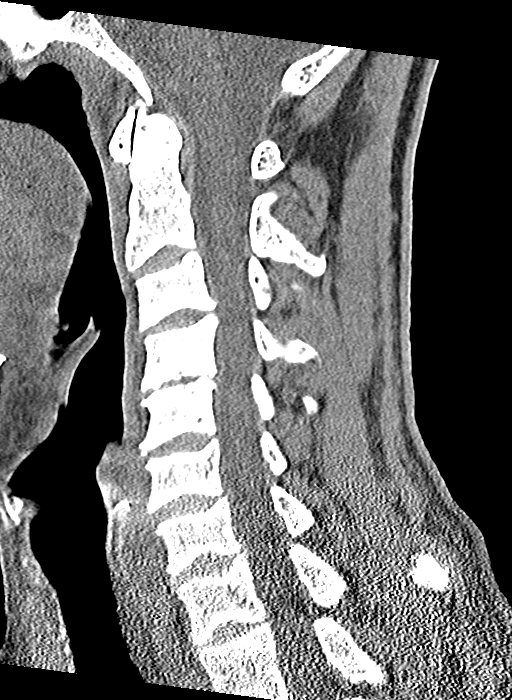
[im 31/61  bone]
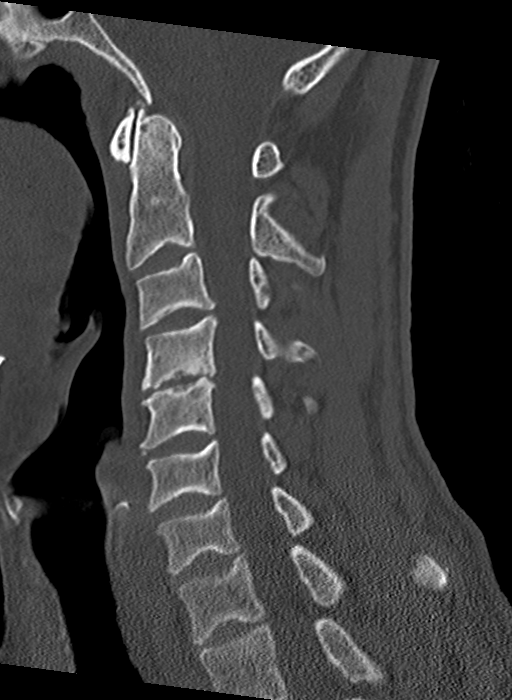
[im 36/61  bone]
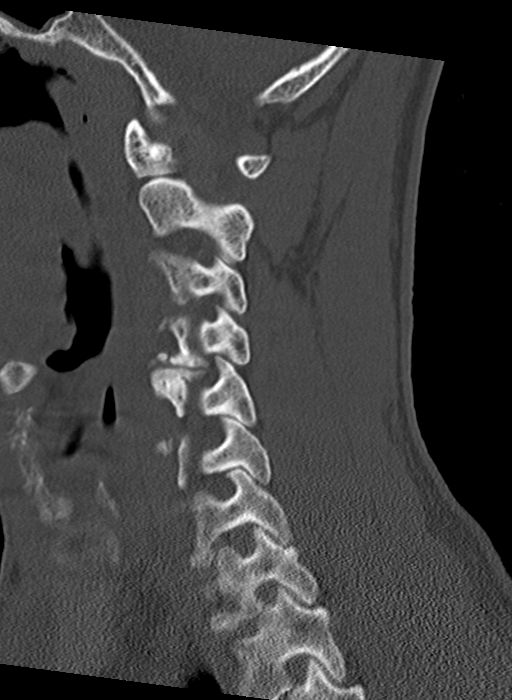
[im 41/61  bone]
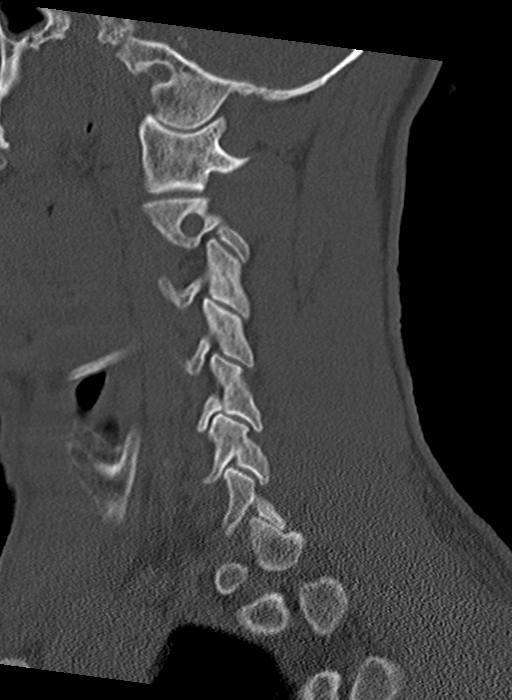

[12 of 33 positions shown; findings below may reference images not displayed]

FINDINGS: CT HEAD FINDINGS

Brain: No evidence of acute large vascular territory infarction,
hemorrhage, hydrocephalus, extra-axial collection or mass
lesion/mass effect. Small remote appearing infarct in the right
frontal periventricular lobe. Small mega cisterna magna.

Vascular: No hyperdense vessel identified. Calcific intracranial
atherosclerosis.

Skull: No acute fracture.

Sinuses/Orbits: Mild paranasal sinus mucosal thickening.

Other: No mastoid effusions.

CT CERVICAL SPINE FINDINGS

Alignment: Mild reversal of normal cervical lordosis. Slight
retrolisthesis of C4 on C5, likely degenerative given degenerative
changes at this level.

Skull base and vertebrae: No evidence of acute fracture. Height loss
of lower cervical vertebral bodies is favored chronic given no
trabecular sclerosis or lucency and similar appearance of the imaged
lower cervical vertebral bodies on prior CT chest from [DATE].

Soft tissues and spinal canal: No prevertebral fluid or swelling. No
visible canal hematoma.

Disc levels: Moderate degenerative disc disease at C4-C5 with
endplate sclerosis/irregularity, disc height loss and posterior disc
osteophyte complexes. Right greater than left facet and
uncovertebral hypertrophy at this level with probably moderate right
foraminal stenosis.

Upper chest: Visualized lung apices are clear.
IMPRESSION: CT head:

1. No evidence of acute intracranial abnormality.
2. Small remote appearing infarct in the right frontal
periventricular lobe.

CT cervical spine:

1. No evidence of acute fracture or traumatic malalignment.
2. C4-C5 degenerative change, detailed above.

## 2021-02-17 IMAGING — CR DG CHEST 2V
2 series · 2 of 2 positions shown · non-contrast
Comparison: [DATE]

CLINICAL DATA: Syncope

EXAM:
CHEST - 2 VIEW

[w chest pa]
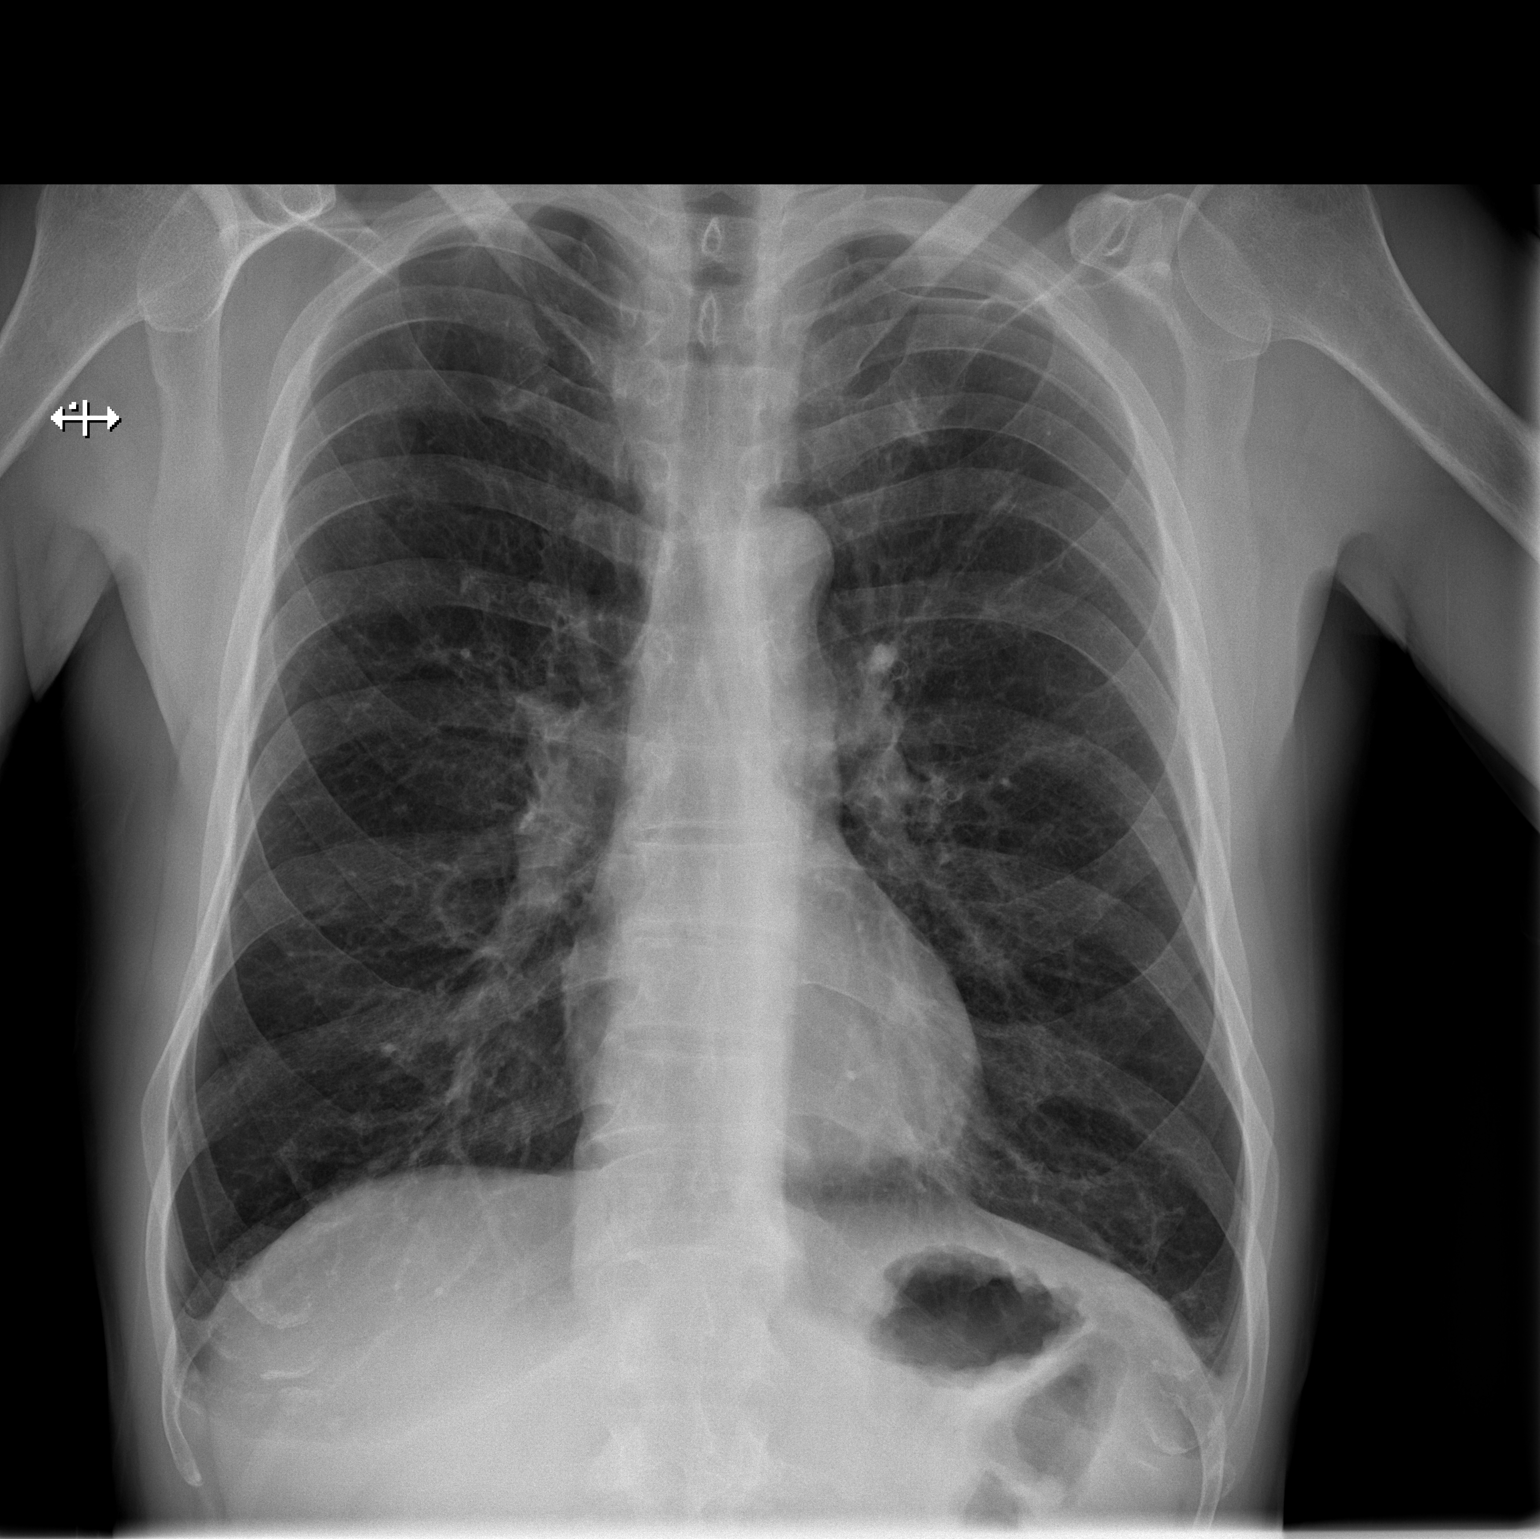

[w chest lat]
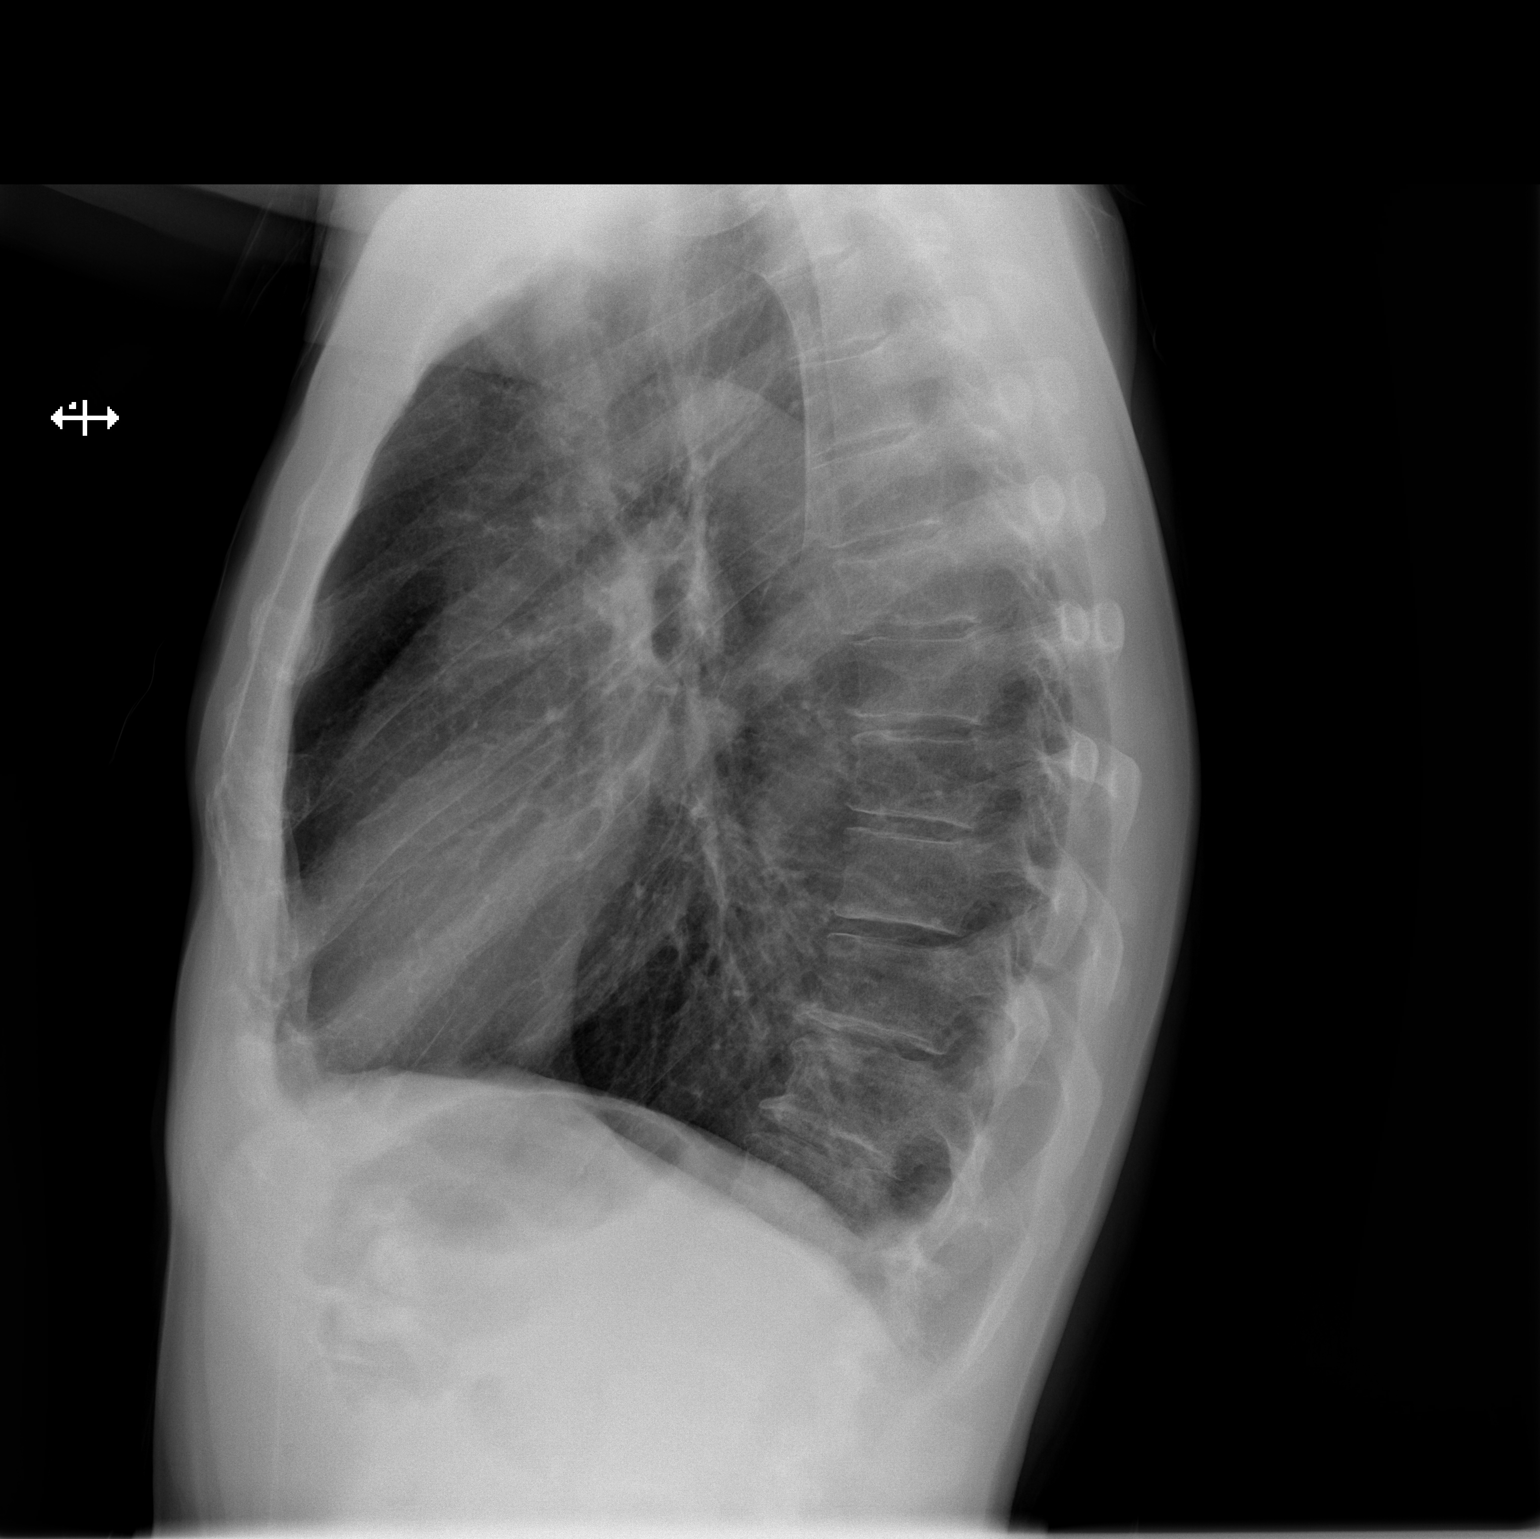

[2 of 2 positions shown; findings below may reference images not displayed]

FINDINGS: The heart size and mediastinal contours are within normal limits.
Both lungs are clear. The visualized skeletal structures are
unremarkable.
IMPRESSION: No acute abnormality of the lungs.

## 2021-02-17 MED ORDER — SODIUM CHLORIDE 0.9 % IV BOLUS (SEPSIS)
1000.0000 mL | Freq: Once | INTRAVENOUS | Status: AC
  Administered 2021-02-17: 1000 mL via INTRAVENOUS

## 2021-02-17 MED ORDER — SODIUM CHLORIDE 0.9 % IV SOLN
1000.0000 mL | INTRAVENOUS | Status: DC
  Administered 2021-02-17: 1000 mL via INTRAVENOUS

## 2021-02-17 MED ORDER — SODIUM CHLORIDE 0.9 % IV BOLUS
1000.0000 mL | Freq: Once | INTRAVENOUS | Status: AC
  Administered 2021-02-17: 1000 mL via INTRAVENOUS

## 2021-02-17 NOTE — ED Provider Notes (Signed)
[facility] DEPT Provider Note   CSN: [id number] Arrival date & time: 02/17/21  1093     History  Chief Complaint  Patient presents with   Vomiting    Gabriel Harris is a 61 y.o. male.  HPI   Pt states he had a syncopal episode today.  This is the second time this week.  He was at work when he fell all of a sudden.  Pt has had diarrhea the last three days but none today.  He did vomit twice this morning.  Pt has not eaten much the last couple of days.  Pt has not had the money to buy food.  He has been very hungry though.  No fever.  No trouble with breathing right now but he felt a little bit this am.  He has had a chronic cough and is also having pain in his right flank that he thinks is coming from his kidney    Home Medications Prior to Admission medications   Medication Sig Start Date End Date Taking? Authorizing Provider  amLODipine (NORVASC) 10 MG tablet Take 1 tablet (10 mg total) by mouth daily. 06/04/19 08/03/19  Carmin Muskrat, MD  amLODipine (NORVASC) 5 MG tablet Take 5 mg by mouth daily. 08/18/20   [provider]  cyclobenzaprine (FLEXERIL) 10 MG tablet Take 1 tablet (10 mg total) by mouth 2 (two) times daily as needed for muscle spasms. 09/02/20   Gareth Morgan, MD  hydrALAZINE (APRESOLINE) 25 MG tablet Take 1 tablet (25 mg total) by mouth every 8 (eight) hours. 06/04/19 08/03/19  Carmin Muskrat, MD  lidocaine (LIDODERM) 5 % Place 1 patch onto the skin daily. Remove & Discard patch within 12 hours or as directed by MD 09/02/20   Gareth Morgan, MD  nortriptyline (PAMELOR) 25 MG capsule Take 1 capsule (25 mg total) by mouth at bedtime. 08/31/20   Pieter Partridge, DO      Allergies    Patient has no known allergies.    Review of Systems   Review of Systems  Constitutional:  Negative for fever.   Physical Exam Updated Vital Signs BP 140/80 (BP Location: Left Arm)    Pulse 80    Temp 97.6 F (36.4 C) (Oral)    Resp 17    Ht 1.778 m  (5\' 10" )    Wt 68 kg    SpO2 98%    BMI 21.52 kg/m  Physical Exam Vitals and nursing note reviewed.  Constitutional:      General: He is not in acute distress.    Appearance: He is well-developed.  HENT:     Head: Normocephalic and atraumatic.     Right Ear: External ear normal.     Left Ear: External ear normal.  Eyes:     General: No scleral icterus.       Right eye: No discharge.        Left eye: No discharge.     Conjunctiva/sclera: Conjunctivae normal.  Neck:     Trachea: No tracheal deviation.  Cardiovascular:     Rate and Rhythm: Normal rate and regular rhythm.  Pulmonary:     Effort: Pulmonary effort is normal. No respiratory distress.     Breath sounds: Normal breath sounds. No stridor. No wheezing or rales.  Abdominal:     General: Bowel sounds are normal. There is no distension.     Palpations: Abdomen is soft.     Tenderness: There is no abdominal tenderness. There  is no guarding or rebound.  Musculoskeletal:        General: No tenderness or deformity.     Cervical back: Neck supple.  Skin:    General: Skin is warm and dry.     Findings: No rash.  Neurological:     General: No focal deficit present.     Mental Status: He is alert.     Cranial Nerves: No cranial nerve deficit (no facial droop, extraocular movements intact, no slurred speech).     Sensory: No sensory deficit.     Motor: No abnormal muscle tone or seizure activity.     Coordination: Coordination normal.  Psychiatric:        Mood and Affect: Mood normal.    ED Results / Procedures / Treatments   Labs (all labs ordered are listed, but only abnormal results are displayed) Labs Reviewed  CBC WITH DIFFERENTIAL/PLATELET - Abnormal; Notable for the following components:      Result Value   RBC 6.06 (*)    Hemoglobin 18.3 (*)    HCT 55.5 (*)    All other components within normal limits  COMPREHENSIVE METABOLIC PANEL - Abnormal; Notable for the following components:   BUN 25 (*)    Creatinine,  Ser 2.37 (*)    Calcium 8.8 (*)    Total Bilirubin 0.2 (*)    GFR, Estimated 31 (*)    All other components within normal limits  LIPASE, BLOOD - Abnormal; Notable for the following components:   Lipase 78 (*)    All other components within normal limits  URINALYSIS, ROUTINE W REFLEX MICROSCOPIC - Abnormal; Notable for the following components:   Hgb urine dipstick SMALL (*)    Bacteria, UA RARE (*)    All other components within normal limits  CBG MONITORING, ED  TROPONIN I (HIGH SENSITIVITY)  TROPONIN I (HIGH SENSITIVITY)    EKG EKG Interpretation  Date/Time:  Thursday February 17 2021 10:45:17 EST Ventricular Rate:  55 PR Interval:  130 QRS Duration: 83 QT Interval:  417 QTC Calculation: 399 R Axis:   78 Text Interpretation: Sinus rhythm Probable left atrial enlargement Baseline wander in lead(s) V3 when compared to prior, similar bradycardia. No STEMI Confirmed by Antony Blackbird [id number]) on 02/17/2021 12:35:06 PM  Radiology DG Chest 2 View  Result Date: 02/17/2021 CLINICAL DATA:  Syncope EXAM: CHEST - 2 VIEW COMPARISON:  11/30/2004 FINDINGS: The heart size and mediastinal contours are within normal limits. Both lungs are clear. The visualized skeletal structures are unremarkable. IMPRESSION: No acute abnormality of the lungs. Electronically Signed   By: Delanna Ahmadi M.D.   On: 02/17/2021 10:35   CT HEAD WO CONTRAST (5MM)  Result Date: 02/17/2021 CLINICAL DATA:  Head trauma, abnormal mental status (Age 71-64y); Neck trauma, dangerous injury mechanism (Age 70-64y) EXAM: CT HEAD WITHOUT CONTRAST CT CERVICAL SPINE WITHOUT CONTRAST TECHNIQUE: Multidetector CT imaging of the head and cervical spine was performed following the standard protocol without intravenous contrast. Multiplanar CT image reconstructions of the cervical spine were also generated. RADIATION DOSE REDUCTION: This exam was performed according to the departmental dose-optimization program which includes automated  exposure control, adjustment of the mA and/or kV according to patient size and/or use of iterative reconstruction technique. COMPARISON:  CT head 09/02/2020. FINDINGS: CT HEAD FINDINGS Brain: No evidence of acute large vascular territory infarction, hemorrhage, hydrocephalus, extra-axial collection or mass lesion/mass effect. 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 Vascular: No hyperdense vessel  identified. Calcific intracranial atherosclerosis. Skull: No acute fracture. Sinuses/Orbits: Mild paranasal sinus mucosal thickening. Other: No mastoid effusions. CT CERVICAL SPINE FINDINGS Alignment: Mild reversal of normal cervical lordosis. Slight retrolisthesis of C4 on C5, likely degenerative given degenerative changes at this level. Skull base and vertebrae: No evidence of acute fracture. Height loss of lower cervical vertebral bodies is favored chronic given no trabecular sclerosis or lucency and similar appearance of the imaged lower cervical vertebral bodies on prior CT chest from March 18, 2019. Soft tissues and spinal canal: No prevertebral fluid or swelling. No visible canal hematoma. Disc levels: Moderate degenerative disc disease at C4-C5 with endplate sclerosis/irregularity, disc height loss and posterior disc osteophyte complexes. Right greater than left facet and uncovertebral hypertrophy at this level with probably moderate right foraminal stenosis. Upper chest: Visualized lung apices are clear. IMPRESSION: CT head: 1. No evidence of acute intracranial abnormality. 2. 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 CT cervical spine: 1. No evidence of acute fracture or traumatic malalignment. 2. C4-C5 degenerative change, detailed above. Electronically Signed   By: Margaretha Sheffield M.D.   On: 02/17/2021 11:49   CT Cervical Spine Wo Contrast  Result Date: 02/17/2021 CLINICAL DATA:  Head trauma, abnormal mental status  (Age 33-64y); Neck trauma, dangerous injury mechanism (Age 18-64y) EXAM: CT HEAD WITHOUT CONTRAST CT CERVICAL SPINE WITHOUT CONTRAST TECHNIQUE: Multidetector CT imaging of the head and cervical spine was performed following the standard protocol without intravenous contrast. Multiplanar CT image reconstructions of the cervical spine were also generated. RADIATION DOSE REDUCTION: This exam was performed according to the departmental dose-optimization program which includes automated exposure control, adjustment of the mA and/or kV according to patient size and/or use of iterative reconstruction technique. COMPARISON:  CT head 09/02/2020. FINDINGS: CT HEAD FINDINGS Brain: No evidence of acute large vascular territory infarction, hemorrhage, hydrocephalus, extra-axial collection or mass lesion/mass effect. 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 Vascular: No hyperdense vessel identified. Calcific intracranial atherosclerosis. Skull: No acute fracture. Sinuses/Orbits: Mild paranasal sinus mucosal thickening. Other: No mastoid effusions. CT CERVICAL SPINE FINDINGS Alignment: Mild reversal of normal cervical lordosis. Slight retrolisthesis of C4 on C5, likely degenerative given degenerative changes at this level. Skull base and vertebrae: No evidence of acute fracture. Height loss of lower cervical vertebral bodies is favored chronic given no trabecular sclerosis or lucency and similar appearance of the imaged lower cervical vertebral bodies on prior CT chest from March 18, 2019. Soft tissues and spinal canal: No prevertebral fluid or swelling. No visible canal hematoma. Disc levels: Moderate degenerative disc disease at C4-C5 with endplate sclerosis/irregularity, disc height loss and posterior disc osteophyte complexes. Right greater than left facet and uncovertebral hypertrophy at this level with probably moderate right foraminal stenosis. Upper chest:  Visualized lung apices are clear. IMPRESSION: CT head: 1. No evidence of acute intracranial abnormality. 2. 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 CT cervical spine: 1. No evidence of acute fracture or traumatic malalignment. 2. C4-C5 degenerative change, detailed above. Electronically Signed   By: Margaretha Sheffield M.D.   On: 02/17/2021 11:49   CT Renal Stone Study  Result Date: 02/17/2021 CLINICAL DATA:  Nausea vomiting, diarrhea for several days EXAM: CT ABDOMEN AND PELVIS WITHOUT CONTRAST TECHNIQUE: Multidetector CT imaging of the abdomen and pelvis was performed following the standard protocol without IV contrast. RADIATION DOSE REDUCTION: This exam was performed according to the departmental dose-optimization program which includes automated  exposure control, adjustment of the mA and/or kV according to patient size and/or use of iterative reconstruction technique. COMPARISON:  09/02/2020 FINDINGS: Lower chest: No acute pleural or parenchymal lung disease. Hepatobiliary: Unremarkable unenhanced appearance of the liver and gallbladder. Pancreas: Unremarkable unenhanced appearance. Spleen: Unremarkable unenhanced appearance. Adrenals/Urinary Tract: No urinary tract calculi or obstructive uropathy. The adrenals and bladder are unremarkable. Stomach/Bowel: No bowel obstruction or ileus. Normal appendix right lower quadrant. No bowel wall thickening or inflammatory change. Vascular/Lymphatic: Aortic atherosclerosis. No enlarged abdominal or pelvic lymph nodes. Reproductive: Prostate is unremarkable. Other: No free fluid or free gas.  No abdominal wall hernia. Musculoskeletal: No acute or destructive bony lesions. Reconstructed images demonstrate no additional findings. IMPRESSION: 1. No acute intra-abdominal or intrapelvic process. 2.  Aortic Atherosclerosis (ICD10-I70.0). Electronically Signed   By: Randa Ngo M.D.   On: 02/17/2021 20:21    Procedures Procedures     Medications Ordered in ED Medications  sodium chloride 0.9 % bolus 1,000 mL (0 mLs Intravenous Stopped 02/17/21 2232)    Followed by  0.9 %  sodium chloride infusion (0 mLs Intravenous Stopped 02/17/21 2246)  sodium chloride 0.9 % bolus 1,000 mL (0 mLs Intravenous Stopped 02/17/21 2107)    ED Course/ Medical Decision Making/ A&P Clinical Course as of 02/17/21 2357  Thu Feb 17, 2021  1947 Lipase, blood(!) Lipase elevated at 78 [JK]  1947 CBC with Differential(!) Hemoglobin increased at 18.3 [JK]  1947 Comprehensive metabolic panel(!) Creatinine increased at 2.37 but unchanged compared to previous values [JK]  1948 Head CT without acute changes [JK]  1948 CT Cervical Spine Wo Contrast C-spine CT without fracture [JK]  1949 Chest x-ray images and radiology report reviewed.  No acute findings noted, hyperexpansion of the lungs [JK]  2224 CT Renal Stone Study CT scan of the kidneys without acute findings [JK]    Clinical Course User Index [JK] Dorie Rank, MD                           Medical Decision Making Amount and/or Complexity of Data Reviewed Labs:  Decision-making details documented in ED Course. Radiology: ordered. Decision-making details documented in ED Course.  Risk Prescription drug management.   Near syncope Patient presented to ED with complaints of falls and weakness.  He admits to having some issues with vomiting and diarrhea the last few days.  He also has not been eating as well.  Patient's laboratory tests are overall reassuring.  He has stable renal insufficiency.  Back pain ctscan was performed.  No signs of acute renal colic or other acute abnormality.  Suspect pain musculoskeletal in nature.  Patient improved with IV fluid.  He was able to eat and drink without difficulty.  No dysrhythmia noted in the ED and I think the patient does not require admission and is appropriate for outpatient management.       Final Clinical Impression(s) / ED  Diagnoses Final diagnoses:  Nausea and vomiting, unspecified vomiting type  Dehydration    Rx / DC Orders ED Discharge Orders     None         Dorie Rank, MD 02/17/21 2357

## 2021-02-17 NOTE — ED Provider Triage Note (Signed)
Emergency Medicine Provider Triage Evaluation Note  Gabriel Harris , a 61 y.o. male  was evaluated in triage.   Patient reports he has been experiencing nausea vomiting and diarrhea for the past 2-3 days with multiple episodes of nonbloody/nonbilious emesis and nonbloody diarrhea.  No medications prior to arrival for the symptoms.  Patient reports that this morning he took a shower, afterwards he became dizzy and passed out.  Patient denies any preceding chest pain or shortness of breath, patient reports that he landed on his bottom woke up on his back denies any pain from his fall.  Patient does report a chronic cough unchanged over the past year.  Review of Systems  Positive: Nausea, vomiting, diarrhea, dizziness, syncope Negative: Fever, chills, chest pain, shortness of breath, hemoptysis, abdominal pain, numbness, weakness, tingling, vision changes or any additional concerns.  Physical Exam  BP 118/88 (BP Location: Left Arm)    Pulse 63    Temp (!) 97.4 F (36.3 C) (Oral)    Resp 16    Ht 5\' 10"  (1.778 m)    Wt 68 kg    SpO2 94%    BMI 21.52 kg/m  Gen:   Awake, no distress   Resp:  Normal effort  MSK:   Moves extremities without difficulty  Other:  Abdomen soft nontender.    Medical Decision Making  Medically screening exam initiated at 10:41 AM.  Appropriate orders placed.  Gabriel Harris was informed that the remainder of the evaluation will be completed by another provider, this initial triage assessment does not replace that evaluation, and the importance of remaining in the ED until their evaluation is complete.   Note: Portions of this report may have been transcribed using voice recognition software. Every effort was made to ensure accuracy; however, inadvertent computerized transcription errors may still be present.    Deliah Boston, PA-C 02/17/21 1044

## 2021-02-17 NOTE — Discharge Instructions (Signed)
Drink plenty of fluids.  Try to stay well-hydrated.  Follow-up with your primary care doctor to be rechecked

## 2021-02-17 NOTE — ED Triage Notes (Signed)
Pt presents via EMS with n/v and a dizzy spell that occurred this morning.

## 2021-02-17 NOTE — ED Notes (Signed)
Pt tolerated water and crackers.

## 2021-03-10 NOTE — Progress Notes (Signed)
? ?NEUROLOGY FOLLOW UP OFFICE NOTE ? ?[name] ?[id number] ? ?Assessment/Plan:  ? ?New daily persistent headache ?Syncope ?  ?Due to syncope and new onset daily headaches, MRI of brain without contrast  ?Increase nortriptyline to 50mg  at bedtime.   ?Limit use of pain relievers to no more than 2 days out of week to prevent risk of rebound or medication-overuse headache. ?Keep headache diary ?Discussed smoking cessation ?Follow up 6 months.  Further recommendations pending results. ?  ?  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 ? ?UPDATE: ?Started nortriptyline last visit.  No longer on cyclobenzaprine due to cost. ?Unable to get CTA or MRA. ?Headaches are unchanged.  On 02/17/2021, headache got so bad that he passed out.  Went to the ED.  CT head personally reviewed negative for acute findings. CT cervical spine showed degenerative changes at C4-5 but otherwise unremarkable.   ? ?Current NSAIDS/analgesics:  NSAIDs and Tylenol contraindicated due to his kidney issues ?Current Antihypertensive medications:  amlodipine  ?Current antidepressant:  nortriptyline 25mg  QHS ? ?Drinks 1 cup of coffee daily.  Stopped drinking daily Mt Dew a few months ago.  Drinks ginger ale daily.  He is a cigarette smoker with no intention to quit. ?  ?HISTORY: ?Beginning in early 2022,  he reports new headaches as well as fatigue, dizziness and polyarthralgia.    No preceding event such as new medication or head trauma.  "11"/10, across forehead, pounding.  Dizziness/vertigo.  Photophobia, phonophobia, sometimes blurred vision.  No nausea, vomiting, numbness, unilateral weakness or autonomic symptoms.  Started sleeping more frequently.  Persistent and daily.  No known triggers.  Sleep helps minimally.  Not taking any analgesics.   ?  ?  ?No past history of headaches.   ?  ?  ?MRI of brain on 03/10/2019  was unremarkable. ? ?PAST MEDICAL HISTORY: ?Past Medical History:  ?Diagnosis Date  ? Hypertension   ? Kidney disease   ? ? ?MEDICATIONS: ?Current Outpatient Medications on File Prior to Visit  ?Medication Sig Dispense Refill  ? amLODipine (NORVASC) 10 MG tablet Take 1 tablet (10 mg total) by mouth daily. 30 tablet 1  ? amLODipine (NORVASC) 5 MG tablet Take 5 mg by mouth daily.    ? cyclobenzaprine (FLEXERIL) 10 MG tablet Take 1 tablet (10 mg total) by mouth 2 (two) times daily as needed for muscle spasms. 20 tablet 0  ? hydrALAZINE (APRESOLINE) 25 MG tablet Take 1 tablet (25 mg total) by mouth every 8 (eight) hours. 90 tablet 1  ? lidocaine (LIDODERM) 5 % Place 1 patch onto the skin daily. Remove & Discard patch within 12 hours or as directed by MD 30 patch 0  ? nortriptyline (PAMELOR) 25 MG capsule Take 1 capsule (25 mg total) by mouth at bedtime. 30 capsule 3  ? ?No current facility-administered medications on file prior to visit.  ? ? ?ALLERGIES: ?No Known Allergies ? ?FAMILY HISTORY: ?Family History  ?Problem Relation Age of Onset  ? Hypertension Mother   ? Diabetes Mellitus II Mother   ? Hypertension Father   ? Diabetes Mellitus II Father   ? Diabetes Mellitus II Sister   ? ? ?  ?Objective:  ?Blood pressure (!) 147/89, pulse 88, resp. rate 18, height 5\' 6"  (1.676 m), SpO2 96 %. ?General: No acute distress.  Patient appears well-groomed.   ?Head:  Normocephalic/atraumatic ?Eyes:  Fundi examined but not  visualized ?Neck: supple, no paraspinal tenderness, full range of motion ?Heart:  Regular rate and rhythm ?Lungs:  Clear to auscultation bilaterally ?Back: No paraspinal tenderness ?Neurological Exam: alert and oriented to person, place, and time.  Speech fluent and not dysarthric, language intact.  CN II-XII intact. Bulk and tone normal, muscle strength 5/5 throughout.  Sensation to light touch intact.  Deep tendon reflexes 2+ throughout, toes downgoing.  Finger to nose testing intact.  Gait normal, Romberg  negative. ? ? ?Metta Clines, DO ? ?CC: Charlott Rakes, MD ? ? ? ? ? ? ?

## 2021-03-14 ENCOUNTER — Other Ambulatory Visit: Payer: Self-pay

## 2021-03-14 ENCOUNTER — Ambulatory Visit: Payer: Medicaid Other | Admitting: Neurology

## 2021-03-14 ENCOUNTER — Encounter: Payer: Self-pay | Admitting: Neurology

## 2021-03-14 VITALS — BP 147/89 | HR 88 | Resp 18 | Ht 66.0 in

## 2021-03-14 DIAGNOSIS — G4452 New daily persistent headache (NDPH): Secondary | ICD-10-CM | POA: Diagnosis not present

## 2021-03-14 DIAGNOSIS — R55 Syncope and collapse: Secondary | ICD-10-CM

## 2021-03-14 MED ORDER — NORTRIPTYLINE HCL 50 MG PO CAPS: 50.0000 mg | ORAL_CAPSULE | Freq: Every day | ORAL | 5 refills | Status: AC

## 2021-03-14 NOTE — Patient Instructions (Addendum)
Check MRI of brain without contrast ?Increase nortriptyline to 50mg  at bedtime.  If no improvement in 6 to 8 weeks, contact me ?Limit use of pain relievers to no more than 2 days out of week to prevent risk of rebound or medication-overuse headache. ?Keep headache diary ?Follow up 5 months. ?We have sent a referral to [facility] for your MRI and they will call you directly to schedule your appointment. They are located at [place]. If you need to contact them directly please call [phone].  ?

## 2021-04-01 ENCOUNTER — Ambulatory Visit
Admission: RE | Admit: 2021-04-01 | Discharge: 2021-04-01 | Disposition: A | Discharge status: Home / Self Care | Payer: Medicaid Other | Source: Ambulatory Visit | Attending: Neurology | Admitting: Neurology

## 2021-04-01 IMAGING — MR MR HEAD W/O CM
10 series · 48 of 48 positions shown · non-contrast
Comparison: CT head [DATE], MR head [DATE]

CLINICAL DATA: Syncope, headache for 2 years

EXAM:
MRI HEAD WITHOUT CONTRAST
TECHNIQUE: Multiplanar, multiecho pulse sequences of the brain and surrounding
structures were obtained without intravenous contrast.

[Series 3: T1 · sagittal · 5.0mm · 0.45mm/px · 3 of 21 slices shown]
[im 1/21]
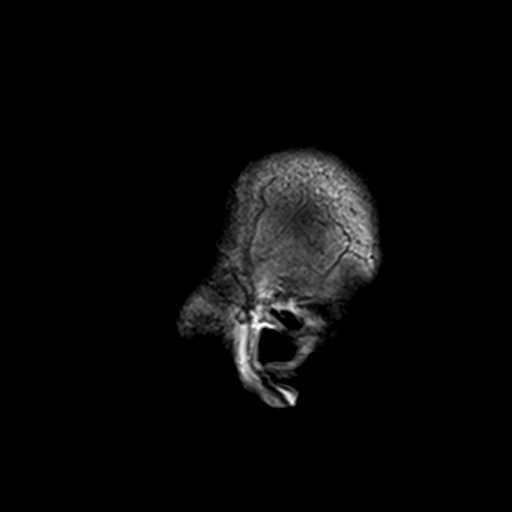
[im 11/21]
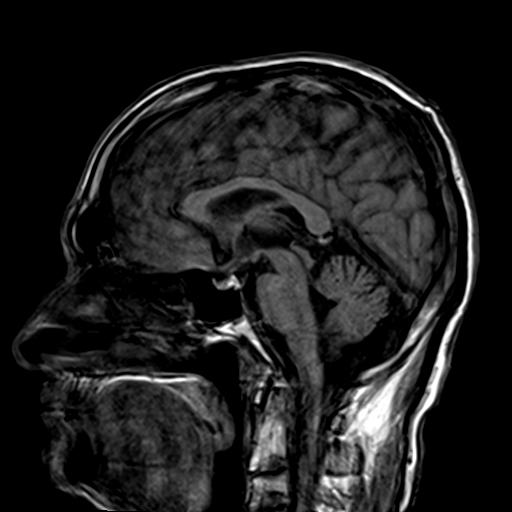
[im 21/21]
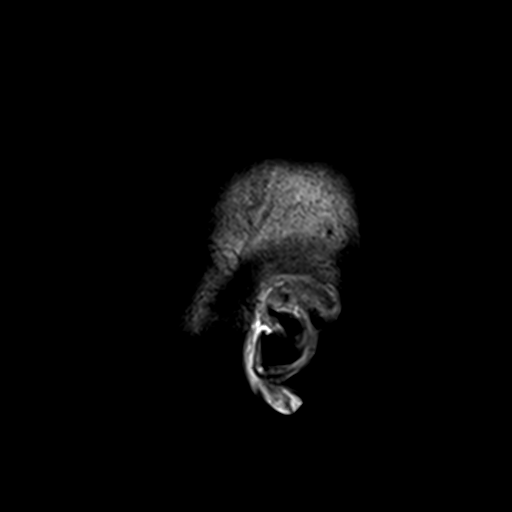

[Series 4: DWI · axial · 3.0mm · 1.80mm/px · z∈[-26,+120]mm · 9 of 100 slices shown (1 of 4)]
[im 1/100]
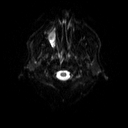
[im 13/100]
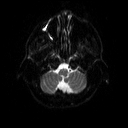
[im 25/100]
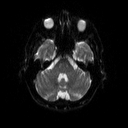
[im 38/100]
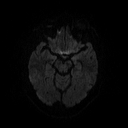
[im 50/100]
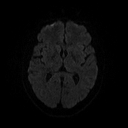
[im 62/100]
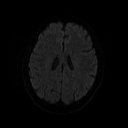
[im 75/100]
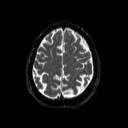
[im 87/100]
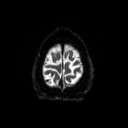
[im 100/100]
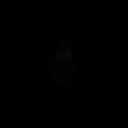

[Series 5: DWI · axial · 3.0mm · 1.80mm/px · z∈[-26,+120]mm · 4 of 48 slices shown (2 of 4)]
[im 1/48]
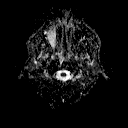
[im 16/48]
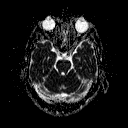
[im 32/48]
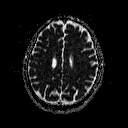
[im 48/48]
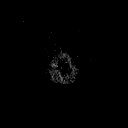

[Series 6: DWI · coronal · 5.0mm · 1.80mm/px · 6 of 69 slices shown (3 of 4)]
[im 1/69]
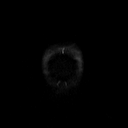
[im 14/69]
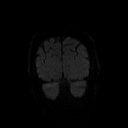
[im 28/69]
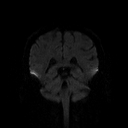
[im 41/69]
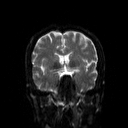
[im 55/69]
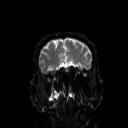
[im 69/69]
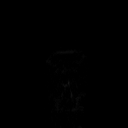

[Series 7: DWI · coronal · 5.0mm · 1.80mm/px · 3 of 34 slices shown (4 of 4)]
[im 1/34]
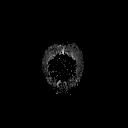
[im 17/34]
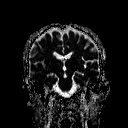
[im 34/34]
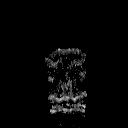

[Series 8: T2 · axial · 5.0mm · 0.51mm/px · z∈[-30,+116]mm · 2 of 22 slices shown (1 of 2)]
[im 1/22]
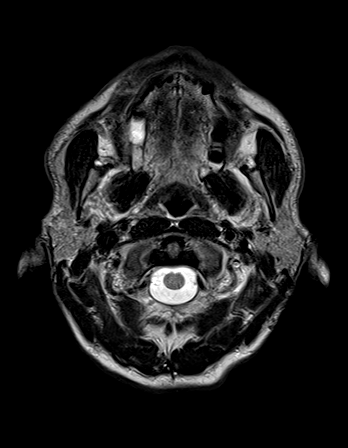
[im 22/22]
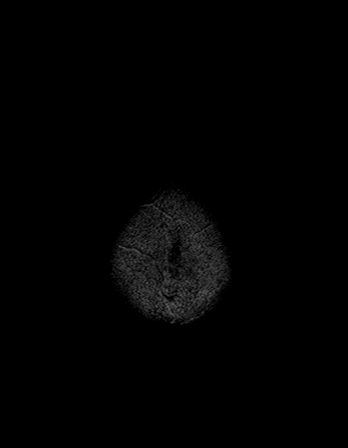

[Series 9: FLAIR · axial · 3.0mm · 0.45mm/px · z∈[-25,+109]mm · 3 of 30 slices shown]
[im 1/30]
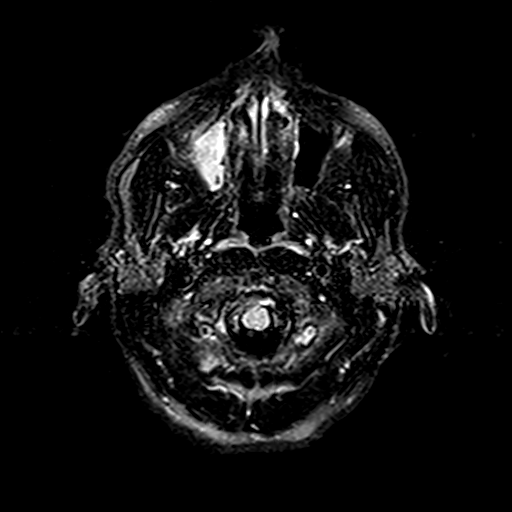
[im 15/30]
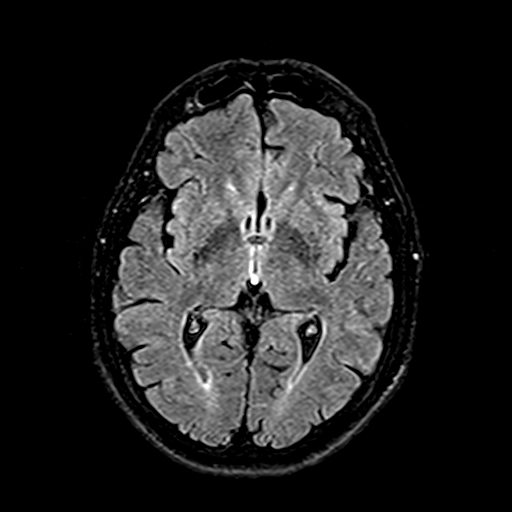
[im 30/30]
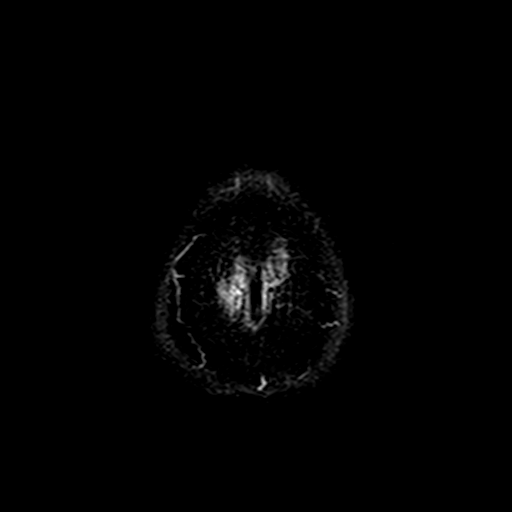

[Series 11: swi_images · axial · 4.0mm · 0.90mm/px · z∈[-27,+112]mm · 3 of 36 slices shown]
[im 1/36]
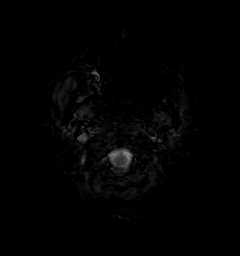
[im 18/36]
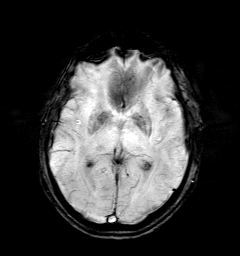
[im 36/36]
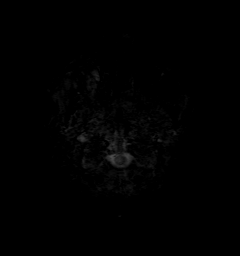

[Series 12: t1_mpr_tra · axial · 1.0mm · 0.71mm/px · z∈[-28,+114]mm · 13 of 144 slices shown]
[im 1/144]
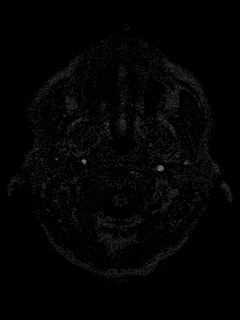
[im 12/144]
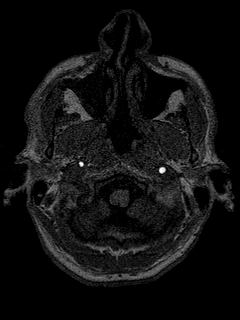
[im 24/144]
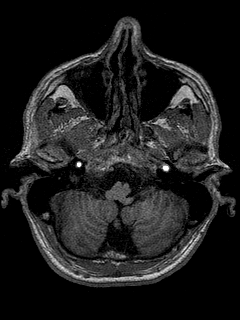
[im 36/144]
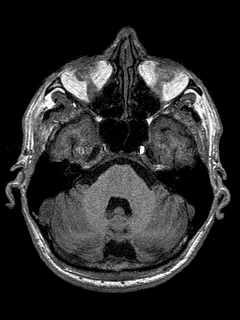
[im 48/144]
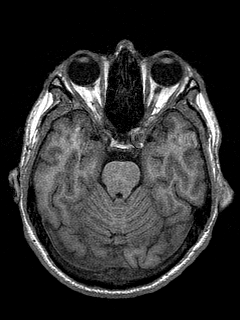
[im 60/144]
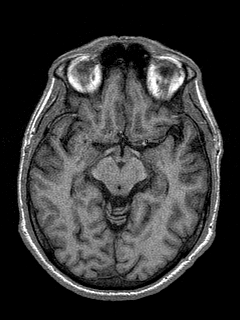
[im 72/144]
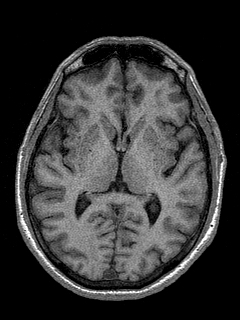
[im 84/144]
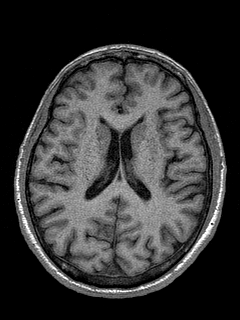
[im 96/144]
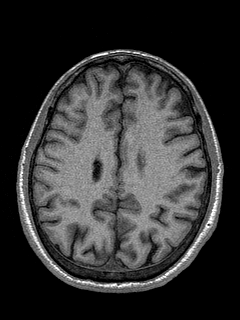
[im 108/144]
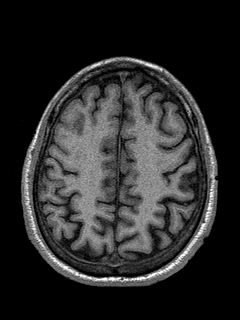
[im 120/144]
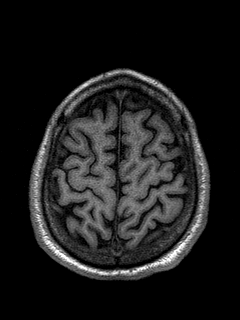
[im 132/144]
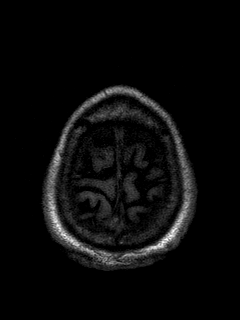
[im 144/144]
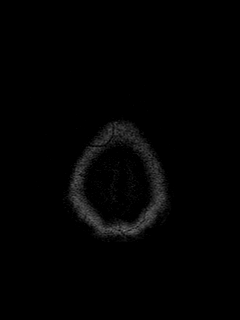

[Series 13: T2 · coronal · 5.0mm · 0.45mm/px · 2 of 27 slices shown (2 of 2)]
[im 1/27]
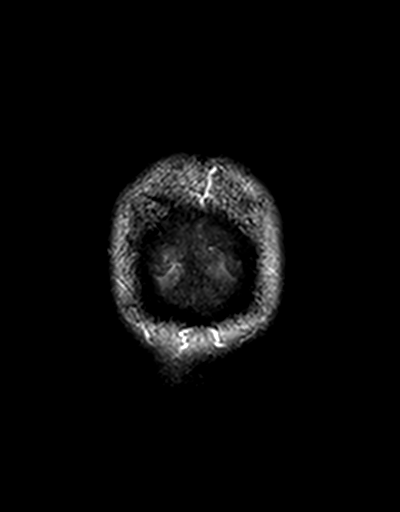
[im 27/27]
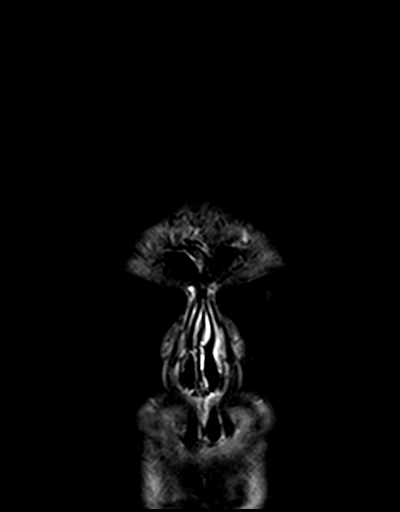

[48 of 48 positions shown; findings below may reference images not displayed]

FINDINGS: Brain: There is no evidence of acute intracranial hemorrhage,
extra-axial fluid collection, or acute infarct.

Parenchymal volume is normal. The ventricles are normal in size.
There is a small remote infarct in the right caudate body, new since
[NB]. There are a few scattered small foci of FLAIR signal
abnormality in the subcortical and periventricular white matter and
pons likely reflecting sequela of chronic white matter
microangiopathy.

There is no suspicious parenchymal signal abnormality. There is no
mass lesion. There is no mass effect or midline shift.

Vascular: Normal flow voids.

Skull and upper cervical spine: Normal marrow signal.

Sinuses/Orbits: There is mucosal thickening in the right maxillary
sinus. The globes and orbits are unremarkable.

Other: None.
IMPRESSION: 1. No acute intracranial pathology.
2. Small remote infarct in the right caudate body, new since [NB].
Mild chronic white matter microangiopathy.

## 2021-08-31 ENCOUNTER — Ambulatory Visit: Payer: Medicaid Other | Admitting: Neurology
